# Patient Record
Sex: Female | Born: 1975 | Race: White | Hispanic: No | Marital: Single | Smoking: Never smoker
Health system: Southern US, Community
[De-identification: ages and names within clinical notes are randomized; demographics above are authoritative.]

## PROBLEM LIST (undated history)

## (undated) DIAGNOSIS — R87619 Unspecified abnormal cytological findings in specimens from cervix uteri: Secondary | ICD-10-CM

## (undated) DIAGNOSIS — E669 Obesity, unspecified: Secondary | ICD-10-CM

## (undated) DIAGNOSIS — Z973 Presence of spectacles and contact lenses: Secondary | ICD-10-CM

## (undated) DIAGNOSIS — D649 Anemia, unspecified: Secondary | ICD-10-CM

## (undated) DIAGNOSIS — M1990 Unspecified osteoarthritis, unspecified site: Secondary | ICD-10-CM

## (undated) DIAGNOSIS — F419 Anxiety disorder, unspecified: Secondary | ICD-10-CM

## (undated) DIAGNOSIS — A6920 Lyme disease, unspecified: Secondary | ICD-10-CM

## (undated) DIAGNOSIS — A770 Spotted fever due to Rickettsia rickettsii: Secondary | ICD-10-CM

## (undated) DIAGNOSIS — I4510 Unspecified right bundle-branch block: Secondary | ICD-10-CM

## (undated) DIAGNOSIS — F32A Depression, unspecified: Secondary | ICD-10-CM

## (undated) DIAGNOSIS — T8859XA Other complications of anesthesia, initial encounter: Secondary | ICD-10-CM

## (undated) DIAGNOSIS — F329 Major depressive disorder, single episode, unspecified: Secondary | ICD-10-CM

## (undated) DIAGNOSIS — E785 Hyperlipidemia, unspecified: Secondary | ICD-10-CM

## (undated) DIAGNOSIS — T7840XA Allergy, unspecified, initial encounter: Secondary | ICD-10-CM

## (undated) HISTORY — DX: Depression, unspecified: F32.A

## (undated) HISTORY — DX: Hyperlipidemia, unspecified: E78.5

## (undated) HISTORY — DX: Major depressive disorder, single episode, unspecified: F32.9

## (undated) HISTORY — DX: Lyme disease, unspecified: A69.20

## (undated) HISTORY — DX: Unspecified osteoarthritis, unspecified site: M19.90

## (undated) HISTORY — DX: Unspecified right bundle-branch block: I45.10

## (undated) HISTORY — PX: TYMPANOSTOMY TUBE PLACEMENT: SHX32

## (undated) HISTORY — PX: NO PAST SURGERIES: SHX2092

## (undated) HISTORY — DX: Anxiety disorder, unspecified: F41.9

## (undated) HISTORY — DX: Anemia, unspecified: D64.9

## (undated) HISTORY — DX: Obesity, unspecified: E66.9

## (undated) HISTORY — DX: Unspecified abnormal cytological findings in specimens from cervix uteri: R87.619

## (undated) HISTORY — DX: Allergy, unspecified, initial encounter: T78.40XA

---
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
## 1997-05-23 DIAGNOSIS — R87619 Unspecified abnormal cytological findings in specimens from cervix uteri: Secondary | ICD-10-CM

## 1997-05-23 HISTORY — PX: CRYOTHERAPY: SHX1416

## 1997-05-23 HISTORY — DX: Unspecified abnormal cytological findings in specimens from cervix uteri: R87.619

## 2011-03-10 ENCOUNTER — Other Ambulatory Visit: Payer: Self-pay | Admitting: Family Medicine

## 2011-03-10 ENCOUNTER — Other Ambulatory Visit (HOSPITAL_COMMUNITY)
Admission: RE | Admit: 2011-03-10 | Discharge: 2011-03-10 | Disposition: A | Discharge status: Home / Self Care | Payer: BC Managed Care – PPO | Source: Ambulatory Visit | Attending: Family Medicine | Admitting: Family Medicine

## 2011-03-10 DIAGNOSIS — N6320 Unspecified lump in the left breast, unspecified quadrant: Secondary | ICD-10-CM

## 2011-03-10 DIAGNOSIS — Z01419 Encounter for gynecological examination (general) (routine) without abnormal findings: Secondary | ICD-10-CM | POA: Insufficient documentation

## 2011-03-23 ENCOUNTER — Other Ambulatory Visit: Payer: Self-pay

## 2011-04-05 ENCOUNTER — Ambulatory Visit
Admission: RE | Admit: 2011-04-05 | Discharge: 2011-04-05 | Disposition: A | Discharge status: Home / Self Care | Payer: BC Managed Care – PPO | Source: Ambulatory Visit | Attending: Family Medicine | Admitting: Family Medicine

## 2011-04-05 DIAGNOSIS — N6320 Unspecified lump in the left breast, unspecified quadrant: Secondary | ICD-10-CM

## 2012-02-21 LAB — HM PAP SMEAR

## 2012-04-17 ENCOUNTER — Other Ambulatory Visit (HOSPITAL_COMMUNITY)
Admission: RE | Admit: 2012-04-17 | Discharge: 2012-04-17 | Disposition: A | Discharge status: Home / Self Care | Payer: BC Managed Care – PPO | Source: Ambulatory Visit | Attending: Family Medicine | Admitting: Family Medicine

## 2012-04-17 DIAGNOSIS — Z124 Encounter for screening for malignant neoplasm of cervix: Secondary | ICD-10-CM | POA: Insufficient documentation

## 2012-09-28 ENCOUNTER — Other Ambulatory Visit (INDEPENDENT_AMBULATORY_CARE_PROVIDER_SITE_OTHER): Payer: BC Managed Care – PPO

## 2012-09-28 ENCOUNTER — Encounter: Payer: Self-pay | Admitting: Internal Medicine

## 2012-09-28 ENCOUNTER — Ambulatory Visit (INDEPENDENT_AMBULATORY_CARE_PROVIDER_SITE_OTHER): Payer: BC Managed Care – PPO | Admitting: Internal Medicine

## 2012-09-28 VITALS — BP 110/72 | HR 59 | Temp 98.6°F | Ht 69.0 in | Wt 225.8 lb

## 2012-09-28 DIAGNOSIS — Z0000 Encounter for general adult medical examination without abnormal findings: Secondary | ICD-10-CM

## 2012-09-28 DIAGNOSIS — F32A Depression, unspecified: Secondary | ICD-10-CM | POA: Insufficient documentation

## 2012-09-28 DIAGNOSIS — F3289 Other specified depressive episodes: Secondary | ICD-10-CM

## 2012-09-28 DIAGNOSIS — F329 Major depressive disorder, single episode, unspecified: Secondary | ICD-10-CM

## 2012-09-28 LAB — CBC WITH DIFFERENTIAL/PLATELET
Basophils Relative: 0.4 % (ref 0.0–3.0)
Eosinophils Relative: 2.9 % (ref 0.0–5.0)
HCT: 36.9 % (ref 36.0–46.0)
Hemoglobin: 12.7 g/dL (ref 12.0–15.0)
Lymphs Abs: 3.5 10*3/uL (ref 0.7–4.0)
MCV: 88 fl (ref 78.0–100.0)
Monocytes Absolute: 0.6 10*3/uL (ref 0.1–1.0)
Monocytes Relative: 5.6 % (ref 3.0–12.0)
Neutro Abs: 6.3 10*3/uL (ref 1.4–7.7)
Platelets: 313 10*3/uL (ref 150.0–400.0)
RBC: 4.2 Mil/uL (ref 3.87–5.11)
WBC: 10.7 10*3/uL — ABNORMAL HIGH (ref 4.5–10.5)

## 2012-09-28 LAB — HEPATIC FUNCTION PANEL
Bilirubin, Direct: 0 mg/dL (ref 0.0–0.3)
Total Protein: 7.1 g/dL (ref 6.0–8.3)

## 2012-09-28 LAB — URINALYSIS, ROUTINE W REFLEX MICROSCOPIC
Bilirubin Urine: NEGATIVE
Ketones, ur: NEGATIVE
Nitrite: NEGATIVE
Total Protein, Urine: NEGATIVE
Urine Glucose: NEGATIVE
pH: 7 (ref 5.0–8.0)

## 2012-09-28 LAB — LIPID PANEL
Cholesterol: 171 mg/dL (ref 0–200)
HDL: 37.2 mg/dL — ABNORMAL LOW (ref >39.00)
LDL Cholesterol: 100 mg/dL — ABNORMAL HIGH (ref 0–99)
VLDL: 33.4 mg/dL (ref 0.0–40.0)

## 2012-09-28 LAB — BASIC METABOLIC PANEL
BUN: 11 mg/dL (ref 6–23)
Chloride: 106 mEq/L (ref 96–112)
GFR: 105.83 mL/min (ref >60.00)
Potassium: 4 mEq/L (ref 3.5–5.1)
Sodium: 140 mEq/L (ref 135–145)

## 2012-09-28 MED ORDER — CITALOPRAM HYDROBROMIDE 20 MG PO TABS: 20.0000 mg | ORAL_TABLET | Freq: Every day | ORAL | Status: DC

## 2012-09-28 NOTE — Progress Notes (Signed)
  Subjective:    Patient ID: Joyce Gray, female    DOB: [birth year], 37 y.o.   MRN: [id number]  HPI  Patient to me and our practice, here to establish with new primary care provider previously followed with Dr. Jillyn Hidden  patient is here today for annual physical. Patient feels well and has no complaints.  Past Medical History  Diagnosis Date  . Depression    Family History  Problem Relation Age of Onset  . Hyperlipidemia Mother   . Alcohol abuse Father   . Heart disease Maternal Grandfather   . Mental illness Other   . Breast cancer Other    History  Substance Use Topics  . Smoking status: Never Smoker   . Smokeless tobacco: Not on file  . Alcohol Use: Yes    Review of Systems Constitutional: Negative for fever or weight change.  Respiratory: Negative for cough and shortness of breath.   Cardiovascular: Negative for chest pain or palpitations.  Gastrointestinal: Negative for abdominal pain, no bowel changes.  Musculoskeletal: Negative for gait problem or joint swelling.  Skin: Negative for rash.  Neurological: Negative for dizziness or headache.  No other specific complaints in a complete review of systems (except as listed in HPI above).     Objective:   Physical Exam BP 110/72  Pulse 59  Temp(Src) 98.6 F (37 C) (Oral)  Ht 5\' 9"  (1.753 m)  Wt 225 lb 12.8 oz (102.422 kg)  BMI 33.33 kg/m2  SpO2 98% Wt Readings from Last 3 Encounters:  09/28/12 225 lb 12.8 oz (102.422 kg)   Constitutional: She is overweight, but appears well-developed and well-nourished. No distress.  HENT: Head: Normocephalic and atraumatic. Ears: B TMs ok, no erythema or effusion; Nose: Nose normal. Mouth/Throat: Oropharynx is clear and moist. No oropharyngeal exudate.  Eyes: Conjunctivae and EOM are normal. Pupils are equal, round, and reactive to light. No scleral icterus.  Neck: Normal range of motion. Neck supple. No JVD present. No thyromegaly present.  Cardiovascular: Normal rate,  regular rhythm and normal heart sounds.  No murmur heard. No BLE edema. Pulmonary/Chest: Effort normal and breath sounds normal. No respiratory distress. She has no wheezes.  Abdominal: Soft. Bowel sounds are normal. She exhibits no distension. There is no tenderness. no masses Musculoskeletal: Normal range of motion, no joint effusions. No gross deformities Neurological: She is alert and oriented to person, place, and time. No cranial nerve deficit. Coordination, balance, strength, speech and gait are normal.  Skin: Skin is warm and dry. No rash noted. No erythema.  Psychiatric: She has a normal mood and affect. Her behavior is normal. Judgment and thought content normal.   No results found for this basename: WBC, HGB, HCT, PLT, GLUCOSE, CHOL, TRIG, HDL, LDLDIRECT, LDLCALC, ALT, AST, NA, K, CL, CREATININE, BUN, CO2, TSH, PSA, INR, GLUF, HGBA1C, MICROALBUR       Assessment & Plan:  CPX/v70.0 - Patient has been counseled on age-appropriate routine health concerns for screening and prevention. These are reviewed and up-to-date. Immunizations are up-to-date or declined. Labs ordered and reviewed.

## 2012-09-28 NOTE — Patient Instructions (Signed)
It was good to see you today. We have reviewed your prior records including labs and tests today Health Maintenance reviewed - all recommended immunizations and age-appropriate screenings are up-to-date. Test(s) ordered today. Your results will be released to MyChart (or called to you) after review, usually within 72hours after test completion. If any changes need to be made, you will be notified at that same time. Work on lifestyle changes as discussed (low carb, increased protein diet; improved exercise efforts; weight loss) to control sugar, blood pressure and cholesterol levels and/or reduce risk of developing other medical problems. Look into [email or web address] or other type of food journal to assist you in this process. Please schedule followup in 1 year, call sooner if problems.   Health Maintenance, Females A healthy lifestyle and preventative care can promote health and wellness.  Maintain regular health, dental, and eye exams.  Eat a healthy diet. Foods like vegetables, fruits, whole grains, low-fat dairy products, and lean protein foods contain the nutrients you need without too many calories. Decrease your intake of foods high in solid fats, added sugars, and salt. Get information about a proper diet from your caregiver, if necessary.  Regular physical exercise is one of the most important things you can do for your health. Most adults should get at least 150 minutes of moderate-intensity exercise (any activity that increases your heart rate and causes you to sweat) each week. In addition, most adults need muscle-strengthening exercises on 2 or more days a week.   Maintain a healthy weight. The body mass index (BMI) is a screening tool to identify possible weight problems. It provides an estimate of body fat based on height and weight. Your caregiver can help determine your BMI, and can help you achieve or maintain a healthy weight. For adults 20 years and older:  A BMI below 18.5 is  considered underweight.  A BMI of 18.5 to 24.9 is normal.  A BMI of 25 to 29.9 is considered overweight.  A BMI of 30 and above is considered obese.  Maintain normal blood lipids and cholesterol by exercising and minimizing your intake of saturated fat. Eat a balanced diet with plenty of fruits and vegetables. Blood tests for lipids and cholesterol should begin at age 77 and be repeated every 5 years. If your lipid or cholesterol levels are high, you are over 50, or you are a high risk for heart disease, you may need your cholesterol levels checked more frequently.Ongoing high lipid and cholesterol levels should be treated with medicines if diet and exercise are not effective.  If you smoke, find out from your caregiver how to quit. If you do not use tobacco, do not start.  If you are pregnant, do not drink alcohol. If you are breastfeeding, be very cautious about drinking alcohol. If you are not pregnant and choose to drink alcohol, do not exceed 1 drink per day. One drink is considered to be 12 ounces (355 mL) of beer, 5 ounces (148 mL) of wine, or 1.5 ounces (44 mL) of liquor.  Avoid use of street drugs. Do not share needles with anyone. Ask for help if you need support or instructions about stopping the use of drugs.  High blood pressure causes heart disease and increases the risk of stroke. Blood pressure should be checked at least every 1 to 2 years. Ongoing high blood pressure should be treated with medicines, if weight loss and exercise are not effective.  If you are 50 to 36 years old, ask your  caregiver if you should take aspirin to prevent strokes.  Diabetes screening involves taking a blood sample to check your fasting blood sugar level. This should be done once every 3 years, after age 67, if you are within normal weight and without risk factors for diabetes. Testing should be considered at a younger age or be carried out more frequently if you are overweight and have at least 1  risk factor for diabetes.  Breast cancer screening is essential preventative care for women. You should practice "breast self-awareness." This means understanding the normal appearance and feel of your breasts and may include breast self-examination. Any changes detected, no matter how small, should be reported to a caregiver. Women in their 41s and 30s should have a clinical breast exam (CBE) by a caregiver as part of a regular health exam every 1 to 3 years. After age 44, women should have a CBE every year. Starting at age 6, women should consider having a mammogram (breast X-ray) every year. Women who have a family history of breast cancer should talk to their caregiver about genetic screening. Women at a high risk of breast cancer should talk to their caregiver about having an MRI and a mammogram every year.  The Pap test is a screening test for cervical cancer. Women should have a Pap test starting at age 21. Between ages 21 and 76, Pap tests should be repeated every 2 years. Beginning at age 10, you should have a Pap test every 3 years as long as the past 3 Pap tests have been normal. If you had a hysterectomy for a problem that was not cancer or a condition that could lead to cancer, then you no longer need Pap tests. If you are between ages 51 and 41, and you have had normal Pap tests going back 10 years, you no longer need Pap tests. If you have had past treatment for cervical cancer or a condition that could lead to cancer, you need Pap tests and screening for cancer for at least 20 years after your treatment. If Pap tests have been discontinued, risk factors (such as a new sexual partner) need to be reassessed to determine if screening should be resumed. Some women have medical problems that increase the chance of getting cervical cancer. In these cases, your caregiver may recommend more frequent screening and Pap tests.  The human papillomavirus (HPV) test is an additional test that may be used  for cervical cancer screening. The HPV test looks for the virus that can cause the cell changes on the cervix. The cells collected during the Pap test can be tested for HPV. The HPV test could be used to screen women aged 63 years and older, and should be used in women of any age who have unclear Pap test results. After the age of 36, women should have HPV testing at the same frequency as a Pap test.  Colorectal cancer can be detected and often prevented. Most routine colorectal cancer screening begins at the age of 66 and continues through age 8. However, your caregiver may recommend screening at an earlier age if you have risk factors for colon cancer. On a yearly basis, your caregiver may provide home test kits to check for hidden blood in the stool. Use of a small camera at the end of a tube, to directly examine the colon (sigmoidoscopy or colonoscopy), can detect the earliest forms of colorectal cancer. Talk to your caregiver about this at age 37, when routine screening begins.  Direct examination of the colon should be repeated every 5 to 10 years through age 41, unless early forms of pre-cancerous polyps or small growths are found.  Hepatitis C blood testing is recommended for all people born from [birth year] through 1964 and any individual with known risks for hepatitis C.  Practice safe sex. Use condoms and avoid high-risk sexual practices to reduce the spread of sexually transmitted infections (STIs). Sexually active women aged 27 and younger should be checked for Chlamydia, which is a common sexually transmitted infection. Older women with new or multiple partners should also be tested for Chlamydia. Testing for other STIs is recommended if you are sexually active and at increased risk.  Osteoporosis is a disease in which the bones lose minerals and strength with aging. This can result in serious bone fractures. The risk of osteoporosis can be identified using a bone density scan. Women ages 3 and over  and women at risk for fractures or osteoporosis should discuss screening with their caregivers. Ask your caregiver whether you should be taking a calcium supplement or vitamin D to reduce the rate of osteoporosis.  Menopause can be associated with physical symptoms and risks. Hormone replacement therapy is available to decrease symptoms and risks. You should talk to your caregiver about whether hormone replacement therapy is right for you.  Use sunscreen with a sun protection factor (SPF) of 30 or greater. Apply sunscreen liberally and repeatedly throughout the day. You should seek shade when your shadow is shorter than you. Protect yourself by wearing long sleeves, pants, a wide-brimmed hat, and sunglasses year round, whenever you are outdoors.  Notify your caregiver of new moles or changes in moles, especially if there is a change in shape or color. Also notify your caregiver if a mole is larger than the size of a pencil eraser.  Stay current with your immunizations. Document Released: 11/22/2010 Document Revised: 08/01/2011 Document Reviewed: 11/22/2010 [facility] Patient Information 2013 [place], [place].

## 2013-02-18 ENCOUNTER — Other Ambulatory Visit: Payer: BC Managed Care – PPO

## 2013-02-18 ENCOUNTER — Encounter: Payer: Self-pay | Admitting: Internal Medicine

## 2013-02-18 ENCOUNTER — Ambulatory Visit (INDEPENDENT_AMBULATORY_CARE_PROVIDER_SITE_OTHER): Payer: BC Managed Care – PPO | Admitting: Internal Medicine

## 2013-02-18 VITALS — BP 110/80 | HR 64 | Temp 97.9°F | Wt 222.0 lb

## 2013-02-18 DIAGNOSIS — N390 Urinary tract infection, site not specified: Secondary | ICD-10-CM

## 2013-02-18 DIAGNOSIS — R369 Urethral discharge, unspecified: Secondary | ICD-10-CM

## 2013-02-18 DIAGNOSIS — Z23 Encounter for immunization: Secondary | ICD-10-CM

## 2013-02-18 DIAGNOSIS — R350 Frequency of micturition: Secondary | ICD-10-CM

## 2013-02-18 LAB — POCT URINALYSIS DIPSTICK
Bilirubin, UA: NEGATIVE
Ketones, UA: NEGATIVE
Nitrite, UA: NEGATIVE
Protein, UA: NEGATIVE
pH, UA: 5

## 2013-02-18 MED ORDER — CIPROFLOXACIN HCL 500 MG PO TABS: 500.0000 mg | ORAL_TABLET | Freq: Two times a day (BID) | ORAL | Status: DC

## 2013-02-18 NOTE — Patient Instructions (Signed)
It was good to see you today. Urine pregnancy test today is negative Test(s) ordered today. Your results will be released to MyChart (or called to you) after review, usually within 72hours after test completion. If any changes need to be made, you will be notified at that same time. Begin Cipro twice daily for one week while awaiting test results Your prescription(s) have been submitted to your pharmacy. Please take as directed and contact our office if you believe you are having problem(s) with the medication(s). we'll make referral to Gynecologist for evaluation as discussed. Our office will contact you regarding appointment(s) once made. followup as needed, call if symptoms unimproved in next 2-4 weeks

## 2013-02-18 NOTE — Progress Notes (Signed)
HPI: complains of ?UTI symptoms Onset 3 months ago, progressively worse in past 2 weeks associated with suprapubic pressure, cramping sensation and small volume voiding with increased frequency Reports urethral discharge versus urinary leaking spontaneously through the day over past 2 weeks denies dysuria,hematuria, flank pain or fever The patient has a history of prior UTI -also reports interstitial cystitis as diagnosed unofficially by gynecology years ago. Patient denies new sexual partner, but would like to be checked for STI Reports symptoms worse with stress  PMH: reviewed  ROS:  Gen.: No unexpected weight change, no night sweats Lungs: No cough or shortness of breath Cardiovascular: No palpitations or chest pain  PE: BP 110/80  Pulse 64  Temp(Src) 97.9 F (36.6 C) (Oral)  Wt 222 lb (100.699 kg)  BMI 32.77 kg/m2  SpO2 98% General: obese, anxious, but no acute distress Lungs: Clear to auscultation Cardiovascular: Regular rate rhythm, no edema Abdomen: Mild to moderate discomfort of her suprapubic region, no flank tenderness to palpation GU: deferred  Lab Results  Component Value Date   WBC 10.7* 09/28/2012   HGB 12.7 09/28/2012   HCT 36.9 09/28/2012   PLT 313.0 09/28/2012   GLUCOSE 90 09/28/2012   CHOL 171 09/28/2012   TRIG 167.0* 09/28/2012   HDL 37.20* 09/28/2012   LDLCALC 100* 09/28/2012   ALT 13 09/28/2012   AST 17 09/28/2012   NA 140 09/28/2012   K 4.0 09/28/2012   CL 106 09/28/2012   CREATININE 0.7 09/28/2012   BUN 11 09/28/2012   CO2 25 09/28/2012   TSH 2.06 09/28/2012   POC - large LE, mod blood  Assessment/Plan: Suprapubic pressure and pain x3 months, worse in past 2 weeks ?Urethral discharge versus "leaking"  UTI? IC?- Reports unofficial history of same on prior gynecology evaluation  UPT test today negative  Abnormal UA -begin empiric antibiotic x7 days pending Urine culture for identification and sensitivities; also r/o STI with U gc/chlam testing Gyn refer for urogyn  eval Hydration recommended education provided

## 2013-02-21 LAB — URINE CULTURE: Colony Count: 10000

## 2013-11-18 ENCOUNTER — Other Ambulatory Visit: Payer: Self-pay | Admitting: Internal Medicine

## 2013-11-26 ENCOUNTER — Ambulatory Visit (INDEPENDENT_AMBULATORY_CARE_PROVIDER_SITE_OTHER): Payer: BC Managed Care – PPO | Admitting: Internal Medicine

## 2013-11-26 ENCOUNTER — Other Ambulatory Visit (INDEPENDENT_AMBULATORY_CARE_PROVIDER_SITE_OTHER): Payer: BC Managed Care – PPO

## 2013-11-26 ENCOUNTER — Encounter: Payer: Self-pay | Admitting: Internal Medicine

## 2013-11-26 VITALS — BP 124/78 | HR 52 | Temp 97.6°F | Ht 69.0 in | Wt 216.2 lb

## 2013-11-26 DIAGNOSIS — Z124 Encounter for screening for malignant neoplasm of cervix: Secondary | ICD-10-CM

## 2013-11-26 DIAGNOSIS — Z0000 Encounter for general adult medical examination without abnormal findings: Secondary | ICD-10-CM

## 2013-11-26 DIAGNOSIS — M722 Plantar fascial fibromatosis: Secondary | ICD-10-CM

## 2013-11-26 LAB — CBC WITH DIFFERENTIAL/PLATELET
BASOS PCT: 0.4 % (ref 0.0–3.0)
Basophils Absolute: 0 10*3/uL (ref 0.0–0.1)
EOS ABS: 0.3 10*3/uL (ref 0.0–0.7)
Eosinophils Relative: 3.6 % (ref 0.0–5.0)
HCT: 40.1 % (ref 36.0–46.0)
Hemoglobin: 13.7 g/dL (ref 12.0–15.0)
LYMPHS PCT: 32.1 % (ref 12.0–46.0)
Lymphs Abs: 3 10*3/uL (ref 0.7–4.0)
MCHC: 34.1 g/dL (ref 30.0–36.0)
MCV: 88.5 fl (ref 78.0–100.0)
MONOS PCT: 5.7 % (ref 3.0–12.0)
Monocytes Absolute: 0.5 10*3/uL (ref 0.1–1.0)
Neutro Abs: 5.5 10*3/uL (ref 1.4–7.7)
Neutrophils Relative %: 58.2 % (ref 43.0–77.0)
Platelets: 340 10*3/uL (ref 150.0–400.0)
RBC: 4.53 Mil/uL (ref 3.87–5.11)
RDW: 13.6 % (ref 11.5–15.5)
WBC: 9.4 10*3/uL (ref 4.0–10.5)

## 2013-11-26 LAB — LIPID PANEL
Cholesterol: 204 mg/dL — ABNORMAL HIGH (ref 0–200)
HDL: 40.9 mg/dL (ref >39.00)
LDL Cholesterol: 131 mg/dL — ABNORMAL HIGH (ref 0–99)
NONHDL: 163.1
Total CHOL/HDL Ratio: 5
Triglycerides: 162 mg/dL — ABNORMAL HIGH (ref 0.0–149.0)
VLDL: 32.4 mg/dL (ref 0.0–40.0)

## 2013-11-26 LAB — BASIC METABOLIC PANEL
BUN: 12 mg/dL (ref 6–23)
CHLORIDE: 102 meq/L (ref 96–112)
CO2: 28 meq/L (ref 19–32)
CREATININE: 0.6 mg/dL (ref 0.4–1.2)
Calcium: 9.5 mg/dL (ref 8.4–10.5)
GFR: 115 mL/min (ref >60.00)
Glucose, Bld: 94 mg/dL (ref 70–99)
POTASSIUM: 4.7 meq/L (ref 3.5–5.1)
Sodium: 137 mEq/L (ref 135–145)

## 2013-11-26 LAB — URINALYSIS, ROUTINE W REFLEX MICROSCOPIC
Bilirubin Urine: NEGATIVE
Hgb urine dipstick: NEGATIVE
KETONES UR: NEGATIVE
Leukocytes, UA: NEGATIVE
Nitrite: NEGATIVE
RBC / HPF: NONE SEEN (ref 0–?)
TOTAL PROTEIN, URINE-UPE24: NEGATIVE
UROBILINOGEN UA: 0.2 (ref 0.0–1.0)
Urine Glucose: NEGATIVE
WBC, UA: NONE SEEN (ref 0–?)
pH: 6 (ref 5.0–8.0)

## 2013-11-26 LAB — HEPATIC FUNCTION PANEL
ALT: 13 U/L (ref 0–35)
AST: 21 U/L (ref 0–37)
Albumin: 4.1 g/dL (ref 3.5–5.2)
Alkaline Phosphatase: 67 U/L (ref 39–117)
BILIRUBIN TOTAL: 0.6 mg/dL (ref 0.2–1.2)
Bilirubin, Direct: 0.1 mg/dL (ref 0.0–0.3)
TOTAL PROTEIN: 7.8 g/dL (ref 6.0–8.3)

## 2013-11-26 LAB — TSH: TSH: 1.69 u[IU]/mL (ref 0.35–4.50)

## 2013-11-26 MED ORDER — CITALOPRAM HYDROBROMIDE 20 MG PO TABS: ORAL_TABLET | ORAL | Status: DC

## 2013-11-26 NOTE — Progress Notes (Signed)
Subjective:    Patient ID: Joyce Gray, female    DOB: 1976/03/10, 38 y.o.   MRN: [id number]  HPI  patient is here today for annual physical. Patient feels well and has no complaints.  Also reviewed chronic medical issues and interval medical events  Past Medical History  Diagnosis Date  . Depression    Family History  Problem Relation Age of Onset  . Hyperlipidemia Mother   . Alcohol abuse Father   . Heart disease Maternal Grandfather   . Mental illness Other   . Breast cancer Other    History  Substance Use Topics  . Smoking status: Never Smoker   . Smokeless tobacco: Not on file  . Alcohol Use: Yes   Review of Systems  Constitutional: Negative for fatigue and unexpected weight change.  Respiratory: Negative for cough, shortness of breath and wheezing.   Cardiovascular: Negative for chest pain, palpitations and leg swelling.  Gastrointestinal: Negative for nausea, abdominal pain and diarrhea.  Musculoskeletal:       L heel pain, worse in AM getting out of bed - ongoing x 1 mo  Neurological: Negative for dizziness, weakness, light-headedness and headaches.  Psychiatric/Behavioral: Negative for dysphoric mood. The patient is not nervous/anxious.   All other systems reviewed and are negative.      Objective:   Physical Exam  BP 124/78  Pulse 52  Temp(Src) 97.6 F (36.4 C) (Oral)  Ht 5\' 9"  (1.753 m)  Wt 216 lb 4 oz (98.09 kg)  BMI 31.92 kg/m2  SpO2 98% Wt Readings from Last 3 Encounters:  11/26/13 216 lb 4 oz (98.09 kg)  02/18/13 222 lb (100.699 kg)  09/28/12 225 lb 12.8 oz (102.422 kg)   Constitutional: She is obese, but appears well-developed and well-nourished. No distress.  HENT: Head: Normocephalic and atraumatic. Ears: B TMs ok, no erythema or effusion; Nose: Nose normal. Mouth/Throat: Oropharynx is clear and moist. No oropharyngeal exudate.  Eyes: Conjunctivae and EOM are normal. Pupils are equal, round, and reactive to light. No scleral icterus.    Neck: Normal range of motion. Neck supple. No JVD present. No thyromegaly present.  Cardiovascular: Normal rate, regular rhythm and normal heart sounds.  No murmur heard. No BLE edema. Pulmonary/Chest: Effort normal and breath sounds normal. No respiratory distress. She has no wheezes.  Abdominal: Soft. Bowel sounds are normal. She exhibits no distension. There is no tenderness. no masses GU/breast - defer to gyn Musculoskeletal: L heel tenderness consistent with PF, Normal range of motion, no joint effusions. No gross deformities Neurological: She is alert and oriented to person, place, and time. No cranial nerve deficit. Coordination, balance, strength, speech and gait are normal.  Skin: Skin is warm and dry. No rash noted. No erythema.  Psychiatric: She has a normal mood and affect. Her behavior is normal. Judgment and thought content normal.    Lab Results  Component Value Date   WBC 10.7* 09/28/2012   HGB 12.7 09/28/2012   HCT 36.9 09/28/2012   PLT 313.0 09/28/2012   GLUCOSE 90 09/28/2012   CHOL 171 09/28/2012   TRIG 167.0* 09/28/2012   HDL 37.20* 09/28/2012   LDLCALC 100* 09/28/2012   ALT 13 09/28/2012   AST 17 09/28/2012   NA 140 09/28/2012   K 4.0 09/28/2012   CL 106 09/28/2012   CREATININE 0.7 09/28/2012   BUN 11 09/28/2012   CO2 25 09/28/2012   TSH 2.06 09/28/2012    No results found.     Assessment &  Plan:   CPX/v70.0 - Patient has been counseled on age-appropriate routine health concerns for screening and prevention. These are reviewed and up-to-date. Immunizations are up-to-date or declined. Labs ordered and reviewed.  L plantar fascitis - education and reassurance provided - symptomatic care advised

## 2013-11-26 NOTE — Progress Notes (Signed)
Pre visit review using our clinic review tool, if applicable. No additional management support is needed unless otherwise documented below in the visit note. 

## 2013-11-26 NOTE — Patient Instructions (Addendum)
It was good to see you today.  We have reviewed your prior records including labs and tests today  Health Maintenance reviewed - all recommended immunizations and age-appropriate screenings are up-to-date.  Test(s) ordered today. Your results will be released to [place] (or called to you) after review, usually within 72hours after test completion. If any changes need to be made, you will be notified at that same time.  Medications reviewed and updated, no changes recommended at this time. Refill on medication(s) as discussed today.  we'll make referral to gynecologist for your PAP smear. Our office will contact you regarding appointment(s) once made.  Please schedule followup in 12 months for annual exam and labs, call sooner if problems.  Health Maintenance, Female A healthy lifestyle and preventative care can promote health and wellness.  Maintain regular health, dental, and eye exams.  Eat a healthy diet. Foods like vegetables, fruits, whole grains, low-fat dairy products, and lean protein foods contain the nutrients you need without too many calories. Decrease your intake of foods high in solid fats, added sugars, and salt. Get information about a proper diet from your caregiver, if necessary.  Regular physical exercise is one of the most important things you can do for your health. Most adults should get at least 150 minutes of moderate-intensity exercise (any activity that increases your heart rate and causes you to sweat) each week. In addition, most adults need muscle-strengthening exercises on 2 or more days a week.   Maintain a healthy weight. The body mass index (BMI) is a screening tool to identify possible weight problems. It provides an estimate of body fat based on height and weight. Your caregiver can help determine your BMI, and can help you achieve or maintain a healthy weight. For adults 20 years and older:  A BMI below 18.5 is considered underweight.  A BMI of 18.5 to  24.9 is normal.  A BMI of 25 to 29.9 is considered overweight.  A BMI of 30 and above is considered obese.  Maintain normal blood lipids and cholesterol by exercising and minimizing your intake of saturated fat. Eat a balanced diet with plenty of fruits and vegetables. Blood tests for lipids and cholesterol should begin at age 4 and be repeated every 5 years. If your lipid or cholesterol levels are high, you are over 50, or you are a high risk for heart disease, you may need your cholesterol levels checked more frequently.Ongoing high lipid and cholesterol levels should be treated with medicines if diet and exercise are not effective.  If you smoke, find out from your caregiver how to quit. If you do not use tobacco, do not start.  Lung cancer screening is recommended for adults aged 40-80 years who are at high risk for developing lung cancer because of a history of smoking. Yearly low-dose computed tomography (CT) is recommended for people who have at least a 30-pack-year history of smoking and are a current smoker or have quit within the past 15 years. A pack year of smoking is smoking an average of 1 pack of cigarettes a day for 1 year (for example: 1 pack a day for 30 years or 2 packs a day for 15 years). Yearly screening should continue until the smoker has stopped smoking for at least 15 years. Yearly screening should also be stopped for people who develop a health problem that would prevent them from having lung cancer treatment.  If you are pregnant, do not drink alcohol. If you are breastfeeding, be  very cautious about drinking alcohol. If you are not pregnant and choose to drink alcohol, do not exceed 1 drink per day. One drink is considered to be 12 ounces (355 mL) of beer, 5 ounces (148 mL) of wine, or 1.5 ounces (44 mL) of liquor.  Avoid use of street drugs. Do not share needles with anyone. Ask for help if you need support or instructions about stopping the use of drugs.  High blood  pressure causes heart disease and increases the risk of stroke. Blood pressure should be checked at least every 1 to 2 years. Ongoing high blood pressure should be treated with medicines, if weight loss and exercise are not effective.  If you are 55 to 37 years old, ask your caregiver if you should take aspirin to prevent strokes.  Diabetes screening involves taking a blood sample to check your fasting blood sugar level. This should be done once every 3 years, after age 92, if you are within normal weight and without risk factors for diabetes. Testing should be considered at a younger age or be carried out more frequently if you are overweight and have at least 1 risk factor for diabetes.  Breast cancer screening is essential preventative care for women. You should practice "breast self-awareness." This means understanding the normal appearance and feel of your breasts and may include breast self-examination. Any changes detected, no matter how small, should be reported to a caregiver. Women in their 82s and 30s should have a clinical breast exam (CBE) by a caregiver as part of a regular health exam every 1 to 3 years. After age 62, women should have a CBE every year. Starting at age 73, women should consider having a mammogram (breast X-ray) every year. Women who have a family history of breast cancer should talk to their caregiver about genetic screening. Women at a high risk of breast cancer should talk to their caregiver about having an MRI and a mammogram every year.  Breast cancer gene (BRCA)-related cancer risk assessment is recommended for women who have family members with BRCA-related cancers. BRCA-related cancers include breast, ovarian, tubal, and peritoneal cancers. Having family members with these cancers may be associated with an increased risk for harmful changes (mutations) in the breast cancer genes BRCA1 and BRCA2. Results of the assessment will determine the need for genetic counseling  and BRCA1 and BRCA2 testing.  The Pap test is a screening test for cervical cancer. Women should have a Pap test starting at age 84. Between ages 69 and 50, Pap tests should be repeated every 2 years. Beginning at age 22, you should have a Pap test every 3 years as long as the past 3 Pap tests have been normal. If you had a hysterectomy for a problem that was not cancer or a condition that could lead to cancer, then you no longer need Pap tests. If you are between ages 5 and 36, and you have had normal Pap tests going back 10 years, you no longer need Pap tests. If you have had past treatment for cervical cancer or a condition that could lead to cancer, you need Pap tests and screening for cancer for at least 20 years after your treatment. If Pap tests have been discontinued, risk factors (such as a new sexual partner) need to be reassessed to determine if screening should be resumed. Some women have medical problems that increase the chance of getting cervical cancer. In these cases, your caregiver may recommend more frequent screening and Pap  tests.  The human papillomavirus (HPV) test is an additional test that may be used for cervical cancer screening. The HPV test looks for the virus that can cause the cell changes on the cervix. The cells collected during the Pap test can be tested for HPV. The HPV test could be used to screen women aged 26 years and older, and should be used in women of any age who have unclear Pap test results. After the age of 27, women should have HPV testing at the same frequency as a Pap test.  Colorectal cancer can be detected and often prevented. Most routine colorectal cancer screening begins at the age of 7 and continues through age 60. However, your caregiver may recommend screening at an earlier age if you have risk factors for colon cancer. On a yearly basis, your caregiver may provide home test kits to check for hidden blood in the stool. Use of a small camera at the end  of a tube, to directly examine the colon (sigmoidoscopy or colonoscopy), can detect the earliest forms of colorectal cancer. Talk to your caregiver about this at age 54, when routine screening begins. Direct examination of the colon should be repeated every 5 to 10 years through age 63, unless early forms of pre-cancerous polyps or small growths are found.  Hepatitis C blood testing is recommended for all people born from [birth year] through 1964 and any individual with known risks for hepatitis C.  Practice safe sex. Use condoms and avoid high-risk sexual practices to reduce the spread of sexually transmitted infections (STIs). Sexually active women aged 36 and younger should be checked for Chlamydia, which is a common sexually transmitted infection. Older women with new or multiple partners should also be tested for Chlamydia. Testing for other STIs is recommended if you are sexually active and at increased risk.  Osteoporosis is a disease in which the bones lose minerals and strength with aging. This can result in serious bone fractures. The risk of osteoporosis can be identified using a bone density scan. Women ages 70 and over and women at risk for fractures or osteoporosis should discuss screening with their caregivers. Ask your caregiver whether you should be taking a calcium supplement or vitamin D to reduce the rate of osteoporosis.  Menopause can be associated with physical symptoms and risks. Hormone replacement therapy is available to decrease symptoms and risks. You should talk to your caregiver about whether hormone replacement therapy is right for you.  Use sunscreen. Apply sunscreen liberally and repeatedly throughout the day. You should seek shade when your shadow is shorter than you. Protect yourself by wearing long sleeves, pants, a wide-brimmed hat, and sunglasses year round, whenever you are outdoors.  Notify your caregiver of new moles or changes in moles, especially if there is a change  in shape or color. Also notify your caregiver if a mole is larger than the size of a pencil eraser.  Stay current with your immunizations. Document Released: 11/22/2010 Document Revised: 09/03/2012 Document Reviewed: 04/10/2013 [facility] Patient Information 2015 [place], [place]. This information is not intended to replace advice given to you by your health care provider. Make sure you discuss any questions you have with your health care provider. Plantar Fasciitis Plantar fasciitis is a common condition that causes foot pain. It is soreness (inflammation) of the band of tough fibrous tissue on the bottom of the foot that runs from the heel bone (calcaneus) to the ball of the foot. The cause of this soreness may be  from excessive standing, poor fitting shoes, running on hard surfaces, being overweight, having an abnormal walk, or overuse (this is common in runners) of the painful foot or feet. It is also common in aerobic exercise dancers and ballet dancers. SYMPTOMS  Most people with plantar fasciitis complain of:  Severe pain in the morning on the bottom of their foot especially when taking the first steps out of bed. This pain recedes after a few minutes of walking.  Severe pain is experienced also during walking following a long period of inactivity.  Pain is worse when walking barefoot or up stairs DIAGNOSIS   Your caregiver will diagnose this condition by examining and feeling your foot.  Special tests such as X-rays of your foot, are usually not needed. PREVENTION   Consult a sports medicine professional before beginning a new exercise program.  Walking programs offer a good workout. With walking there is a lower chance of overuse injuries common to runners. There is less impact and less jarring of the joints.  Begin all new exercise programs slowly. If problems or pain develop, decrease the amount of time or distance until you are at a comfortable level.  Wear good shoes and replace  them regularly.  Stretch your foot and the heel cords at the back of the ankle (Achilles tendon) both before and after exercise.  Run or exercise on even surfaces that are not hard. For example, asphalt is better than pavement.  Do not run barefoot on hard surfaces.  If using a treadmill, vary the incline.  Do not continue to workout if you have foot or joint problems. Seek professional help if they do not improve. HOME CARE INSTRUCTIONS   Avoid activities that cause you pain until you recover.  Use ice or cold packs on the problem or painful areas after working out.  Only take over-the-counter or prescription medicines for pain, discomfort, or fever as directed by your caregiver.  Soft shoe inserts or athletic shoes with air or gel sole cushions may be helpful.  If problems continue or become more severe, consult a sports medicine caregiver or your own health care provider. Cortisone is a potent anti-inflammatory medication that may be injected into the painful area. You can discuss this treatment with your caregiver. MAKE SURE YOU:   Understand these instructions.  Will watch your condition.  Will get help right away if you are not doing well or get worse. Document Released: 02/01/2001 Document Revised: 08/01/2011 Document Reviewed: 04/02/2008 [facility] Patient Information 2015 [place], [place]. This information is not intended to replace advice given to you by your health care provider. Make sure you discuss any questions you have with your health care provider.

## 2013-12-18 ENCOUNTER — Encounter: Payer: Self-pay | Admitting: Internal Medicine

## 2013-12-18 ENCOUNTER — Ambulatory Visit (INDEPENDENT_AMBULATORY_CARE_PROVIDER_SITE_OTHER)
Admission: RE | Admit: 2013-12-18 | Discharge: 2013-12-18 | Disposition: A | Discharge status: Home / Self Care | Payer: BC Managed Care – PPO | Source: Ambulatory Visit | Attending: Internal Medicine | Admitting: Internal Medicine

## 2013-12-18 ENCOUNTER — Ambulatory Visit (INDEPENDENT_AMBULATORY_CARE_PROVIDER_SITE_OTHER): Payer: BC Managed Care – PPO | Admitting: Internal Medicine

## 2013-12-18 VITALS — BP 104/78 | HR 60 | Temp 98.3°F | Wt 218.2 lb

## 2013-12-18 DIAGNOSIS — R0609 Other forms of dyspnea: Secondary | ICD-10-CM

## 2013-12-18 DIAGNOSIS — R0600 Dyspnea, unspecified: Secondary | ICD-10-CM

## 2013-12-18 DIAGNOSIS — R0989 Other specified symptoms and signs involving the circulatory and respiratory systems: Secondary | ICD-10-CM

## 2013-12-18 DIAGNOSIS — E669 Obesity, unspecified: Secondary | ICD-10-CM

## 2013-12-18 NOTE — Patient Instructions (Addendum)
The Sleep Medicine referral will be scheduled and you'll be notified of the time. The most common cause of elevated triglycerides (TG) is the ingestion of sugar from high fructose corn syrup sources added to processed foods & drinks.  Eat a low-fat diet with lots of fruits and vegetables, up to 7-9 servings per day. Consume less than  30  Grams (preferably ZERO) of sugar per day from foods & drinks with High Fructose Corn Syrup (HFCS) sugar as #1,2,3 or # 4 on label.[employer], [employer] [place] do not carry products with HFCS. Follow a  low carb nutrition program such as [place] or [employer] . White carbohydrates (potatoes, rice, bread, and pasta) have a high spike of sugar and a high load of sugar. For example a  baked potato has a cup of sugar and a  french fry  2 teaspoons of sugar. Yams, wild  rice, whole grained bread &  wheat pasta have been much lower spike and load of  sugar. Portions should be the size of a deck of cards or your palm.

## 2013-12-18 NOTE — Progress Notes (Signed)
   Subjective:    Patient ID: Joyce Gray, female    DOB: 03/11/1976, 38 y.o.   MRN: [id number]  HPI  Between the Fall of 2013 & up to  2 weeks ago,she's had 3-4 episodes of awakening with sensation that she could not breathe.  She lives with her father and step mother. No one has observed her sleep pattern .She is unsure as to snoring or apnea.   On one occasion it did occur after drinking 2 glasses wine before going  to bed  The most recent episode was in the context of a respiratory tract infection 2 weeks ago with increased secretions.  She is also is exposed to 6 cats. She describes only minor sneezing. She does note need for repeated "throat clearing".  All the symptoms seem to be in the context of having gained over 30 pounds during the period tendon 2012-2013 while taking citalopram.The weight gain was in spite of a regular CVE program.  She has noted some change in her hair; it is more fine.  Palpitations have been recurrent issue.  TSH was WNL this month. Those labs revealed elevated TGs.    Review of Systems No significant change in appetite. No blurred, double vision ,loss of vision No constipation; diarrhea;hoarseness;dysphagia No change in nails,skin No numbness or tingling; tremor No anxiety; depression; panic attacks No temperature intolerance to heat ,cold      Objective:   Physical Exam Gen.:  well-nourished; in no acute distress. As per CDC criteria obesity is present. Her weight is mainly in hip girdle distribution rather than centrally. Eyes: Extraocular motion intact; no lid lag or proptosis ,nystagmus Throat: normal diameter of oropharynx w/o crowding Neck: full ROM; no masses ; thyroid normal  Heart: Normal rhythm and rate without significant murmur, gallop, or extra heart sounds Lungs: Chest clear to auscultation without rales,rales, wheezes Neuro:Deep tendon reflexes are equal and within normal limits; no tremor  Skin: Warm and dry without  significant lesions or rashes; no onycholysis Lymphatic: no cervical or axillary LA Psych: Normally communicative and interactive; no abnormal mood or affect clinically.             Assessment & Plan:  #1 nocturnal orthopnea ; R/O sleep apnea #2 hypertriglyceridemia See orders & AVS

## 2013-12-18 NOTE — Progress Notes (Signed)
Pre visit review using our clinic review tool, if applicable. No additional management support is needed unless otherwise documented below in the visit note. 

## 2013-12-25 ENCOUNTER — Encounter: Payer: Self-pay | Admitting: Certified Nurse Midwife

## 2013-12-25 ENCOUNTER — Ambulatory Visit (INDEPENDENT_AMBULATORY_CARE_PROVIDER_SITE_OTHER): Payer: BC Managed Care – PPO | Admitting: Certified Nurse Midwife

## 2013-12-25 VITALS — BP 116/74 | HR 64 | Ht 69.0 in | Wt 214.0 lb

## 2013-12-25 DIAGNOSIS — Z124 Encounter for screening for malignant neoplasm of cervix: Secondary | ICD-10-CM

## 2013-12-25 DIAGNOSIS — Z01419 Encounter for gynecological examination (general) (routine) without abnormal findings: Secondary | ICD-10-CM

## 2013-12-25 NOTE — Patient Instructions (Signed)

## 2013-12-25 NOTE — Progress Notes (Signed)
Patient ID: Joyce Gray, female   DOB: 06/11/1975, 38 y.o.   MRN: [id number] 38 y.o. [id number] Single Caucasian Fe here to establish gyn care and  for annual exam. Not currently sexually active. STD screening after last partner.Last gyn exam 2012. Not currently sexually active, uses condoms for contraception. Sees PCP for aex/labs/ medciation management for depression. No health issues today.  Patient's last menstrual period was 12/16/2013.          Sexually active: No.  The current method of family planning is condoms most of the time and abstinence.    Exercising: Yes.    Gym/ health club routine includes cardio and and weights 4 times per week. Smoker:  no  Health Maintenance: Pap:  Fall 2012, [facility] previous history of abnormal Pap smear, with cryo 11/97, reoccurrence 2004 with colposcopy with repeat pap only [facility]) MMG:  Fall 2012 normal TDaP:  2010 Labs: PCP in [facility] 11/26/13   reports that she has never smoked. She has never used smokeless tobacco. She reports that she drinks alcohol. She reports that she does not use illicit drugs.  Past Medical History  Diagnosis Date  . Depression     Past Surgical History  Procedure Laterality Date  . No past surgeries      Current Outpatient Prescriptions  Medication Sig Dispense Refill  . citalopram (CELEXA) 20 MG tablet TAKE 1 TABLET BY MOUTH EVERY DAY  90 tablet  3   No current facility-administered medications for this visit.    Family History  Problem Relation Age of Onset  . Hyperlipidemia Mother   . Alcohol abuse Father   . Heart disease Maternal Grandfather   . Mental illness Other   . Breast cancer Other     ROS:  Pertinent items are noted in HPI.  Otherwise, a comprehensive ROS was negative.  Exam:   BP 116/74  Pulse 64  Ht 5\' 9"  (1.753 m)  Wt 214 lb (97.07 kg)  BMI 31.59 kg/m2  LMP 12/16/2013 Height: 5\' 9"  (175.3 cm)  Ht Readings from Last 3 Encounters:  12/25/13 5\' 9"  (1.753 m)  11/26/13 5\' 9"  (1.753 m)   09/28/12 5\' 9"  (1.753 m)    General appearance: alert, cooperative and appears stated age Head: Normocephalic, without obvious abnormality, atraumatic Neck: no adenopathy, supple, symmetrical, trachea midline and thyroid normal to inspection and palpation and non-palpable Lungs: clear to auscultation bilaterally Breasts: normal appearance, no masses or tenderness, No nipple retraction or dimpling, No nipple discharge or bleeding, No axillary or supraclavicular adenopathy Heart: regular rate and rhythm Abdomen: soft, non-tender; no masses,  no organomegaly Extremities: extremities normal, atraumatic, no cyanosis or edema Skin: Skin color, texture, turgor normal. No rashes or lesions Lymph nodes: Cervical, supraclavicular, and axillary nodes normal. No abnormal inguinal nodes palpated Neurologic: Grossly normal   Pelvic: External genitalia:  no lesions              Urethra:  normal appearing urethra with no masses, tenderness or lesions              Bartholin's and Skene's: normal                 Vagina: normal appearing vagina with normal color and discharge, no lesions              Cervix: normal,non tender              Pap taken: Yes.   Bimanual Exam:  Uterus:  normal size, contour,  position, consistency, mobility, non-tender and anteverted              Adnexa: normal adnexa and no mass, fullness, tenderness               Rectovaginal: Confirms               Anus:  normal sphincter tone, no lesions  A:  Well Woman with normal exam  Contraception condoms  History of abnormal pap with cryo, last abnormal 2004 with repeat pap only  Depression on stable medication with PCP management  P:   Reviewed health and wellness pertinent to exam  Stressed importance of pap follow up  Pap smear taken today with HPVHR  Continue follow up as indicated   counseled on breast self exam, STD prevention, HIV risk factors and prevention, adequate intake of calcium and vitamin D, diet and  exercise  return annually or prn  An After Visit Summary was printed and given to the patient.

## 2013-12-26 NOTE — Progress Notes (Signed)
Reviewed personally.  M. Suzanne Cesar Rogerson, MD.  

## 2013-12-27 LAB — IPS PAP TEST WITH HPV

## 2014-03-05 ENCOUNTER — Encounter: Payer: Self-pay | Admitting: Family

## 2014-03-05 ENCOUNTER — Ambulatory Visit (INDEPENDENT_AMBULATORY_CARE_PROVIDER_SITE_OTHER): Payer: BC Managed Care – PPO | Admitting: Family

## 2014-03-05 VITALS — BP 130/88 | HR 63 | Temp 98.2°F | Resp 18 | Wt 216.0 lb

## 2014-03-05 DIAGNOSIS — Z309 Encounter for contraceptive management, unspecified: Secondary | ICD-10-CM | POA: Insufficient documentation

## 2014-03-05 DIAGNOSIS — Z30011 Encounter for initial prescription of contraceptive pills: Secondary | ICD-10-CM

## 2014-03-05 MED ORDER — NORGESTIMATE-ETH ESTRADIOL 0.25-35 MG-MCG PO TABS: 1.0000 | ORAL_TABLET | Freq: Every day | ORAL | Status: DC

## 2014-03-05 NOTE — Assessment & Plan Note (Signed)
Discussed multiple birth control options with patient. Decided on oral contraception. Start Sprintec. Information on starting and missing pill given. Informed patient that her cycle may be altered for up to 3 months and to use alternative protection depending on start method selected. Pt in agreement with the plan and will follow up as needed.

## 2014-03-05 NOTE — Patient Instructions (Signed)
Thank you for choosing [facility].  Summary/Instructions:   Your prescription sent to your pharmacy  Instructions are below.     Starting Your Oral Contraception:  1) First Day Start - Take your first pill during the first 24 hours of your menstrual cycle. No back-up contraceptivemethod is needed when the pill is started the first day of your menses.  2) Sunday Start - Wait until the first Sunday after your menstrual cycle begins to take your first pill. With this optionuse another method of birth control for the first 7 days of the first cycle only.  3) "Quick Start" - Start the pill today. If you have had unprotected sexual intercourse since your last period, perform a pregnancy test prior to starting the pill. If it is negative, start the pill today. Use another method of birth control such as condoms or spermicide the first seven days of the first cycle of use.  Oral Contraception Answers to Common Questions.   1)  Take your birth control pill at the same time every day. This ensures that a constant hormone level is maintained at all times.  2) Should you experience nausea or vomiting, try taking your pill after a meal or at bedtime.  3)  Irregular vaginal bleeding or spotting may occur while you are taking the pills, especially during the first few months of oral contraceptive use. If you experience spotting or break-through bleeding, (bleeding that occurs outside of the placebo week) that occurs after the first 3 cycles, or lasts for more than a few days, see your health care provider.  4) Birth control pills do not protect you from sexually transmitted infections.  INSTRUCTIONS FOR MISSED PILLS: 1 active pill < 24 hours late in any week: Take 1 active pill ASAP* and continue pack as usual.  Missed 1 or more active pills (i.e., >24 hours late): If during week 1: Take 1 active pill ASAP* and continue pack as usual. Back-up contraception for 7 days. Consider Emergency  Contraception (EC) if unprotected intercourse occurred within the  5 days prior to missing pill.  If during week 2 or 3 and missed < 3 pills: Take 1 active pill ASAP* and continue active pills as usual, but discard placebo pills and start a new pack  If during week 2 or 3 and ? 3 pills missed: Take 1 active pill ASAP* and continue active pills as usual, but discard placebo pills and start a new pack Back-up contraception for 7 days. Consider EC if repeated or prolonged omission, or if unprotected intercourse occurred during the time the pills were missed and up until seven active pills have been taken Instructions for missed extended or continuous hormonal contraceptives:  Missed pill after 21 consecutive days of extended or continuous use, up to 7 days can be missed.  If > 7 days missed, instructions would be the same as for cyclic users who have missed/delayed combined hormonal contraceptive in the first week of use.  When the extended/continuous regimen is resumed, recommendations for cyclic users for missed/delayed combined hormonal contraceptive during the first 21 consecutive days of use should be followed.  **If you still are not sure what to do about the pills you have missed, use a back-up method anytime you have sex. Keep taking one pill each day until you can reach your doctor or clinic.  MEDICATIONS AND BIRTH CONTROL PILLS: The following medications and supplements may interfere with the effectiveness of CHC:  some antibiotics  anticonvulsants  St. John's  Wort  Provigil  It is recommend that if you take the above medications and supplements and are sexually active with a female partner, you use a back-up method of birth control while using the medication and for 7 consecutive days once the medication is completed.  IF YOU EXPERIENCE ANY OF THE FOLLOWING, PLEASE CALL IMMEDIATELY OR GO TO THE EMERGENCY ROOM:   severe abdominal pain or tenderness in the lower  abdomen  chest pain, sharp, sudden shortness of breath or coughing up blood  headache, severe and sudden, or vomiting, dizziness or faintness  eyesight problems, such as sudden blurred or doubled vision or flashes of light  severe pain or swelling in calf or groin

## 2014-03-05 NOTE — Progress Notes (Signed)
   Subjective:    Patient ID: Joyce Gray, female    DOB: 07/25/1975, 38 y.o.   MRN: [id number]  HPI:  Joyce Gray is a 38 y.o. female who presents today to start birth control. Currently she is not taking anything and wants to gain more control of her cycle. She was previously on Ortho-Tricyclin. She started her menstrual cycle today. She indicates she is not a smoker. Currently her cycle is about every 26 days and fairly regular.   No Known Allergies  Current Outpatient Prescriptions on File Prior to Visit  Medication Sig Dispense Refill  . citalopram (CELEXA) 20 MG tablet TAKE 1 TABLET BY MOUTH EVERY DAY  90 tablet  3  . Multiple Vitamin (MULTIVITAMIN) tablet Take 1 tablet by mouth daily.       No current facility-administered medications on file prior to visit.   Past Medical History  Diagnosis Date  . Depression    Review of Systems     See HPI Objective:    BP 130/88  Pulse 63  Temp(Src) 98.2 F (36.8 C) (Oral)  Resp 18  Wt 216 lb (97.977 kg)  SpO2 98%  LMP 03/05/2014 Nursing note and vital signs reviewed.  Physical Exam  Constitutional: She is oriented to person, place, and time. She appears well-developed and well-nourished.  Cardiovascular: Normal rate, regular rhythm and normal heart sounds.   Pulmonary/Chest: Effort normal and breath sounds normal.  Neurological: She is alert and oriented to person, place, and time.  Skin: Skin is warm and dry.  Psychiatric: She has a normal mood and affect. Her behavior is normal. Judgment and thought content normal.        Assessment & Plan:

## 2014-03-05 NOTE — Progress Notes (Signed)
Pre visit review using our clinic review tool, if applicable. No additional management support is needed unless otherwise documented below in the visit note. 

## 2014-05-23 HISTORY — PX: COLPOSCOPY: SHX161

## 2014-05-26 ENCOUNTER — Telehealth: Payer: Self-pay | Admitting: Certified Nurse Midwife

## 2014-05-26 NOTE — Telephone Encounter (Signed)
Patient wants to speak with nurse about when to schedule an appointment for pregnancy testing.

## 2014-05-26 NOTE — Telephone Encounter (Signed)
Message left to return call to Kaien Pezzullo at [phone].    

## 2014-05-26 NOTE — Telephone Encounter (Signed)
Spoke with patient. Patient has had one week of feeling nauseated, breast tenderness and fatigue.  LMP 05/01/14. Patient started new pack ocp on 05/04/14 but just took one pill only and stopped due to recent break up with boyfriend.   Patient has taken home pregnancy tests but they are negative. No vaginal bleeding or abdominal pain.   Patient scheduled for office visit 05/29/14. Patient advised to call back with any concerns. Patient agreeable.

## 2014-05-27 ENCOUNTER — Telehealth: Payer: Self-pay | Admitting: Certified Nurse Midwife

## 2014-05-27 NOTE — Telephone Encounter (Signed)
LMTCB about canceled appointment °

## 2014-05-29 ENCOUNTER — Ambulatory Visit: Payer: BC Managed Care – PPO | Admitting: Certified Nurse Midwife

## 2014-05-29 NOTE — Telephone Encounter (Signed)
Patient canceled her appointment for "pregnancy test" 06/02/14. Patient says she started her cycle and no longer needs this appointment.

## 2014-06-02 ENCOUNTER — Ambulatory Visit: Payer: BC Managed Care – PPO | Admitting: Certified Nurse Midwife

## 2014-07-10 ENCOUNTER — Ambulatory Visit (INDEPENDENT_AMBULATORY_CARE_PROVIDER_SITE_OTHER): Payer: BC Managed Care – PPO | Admitting: Internal Medicine

## 2014-07-10 ENCOUNTER — Encounter: Payer: Self-pay | Admitting: Internal Medicine

## 2014-07-10 VITALS — BP 130/78 | HR 61 | Temp 97.8°F | Ht 69.0 in | Wt 222.8 lb

## 2014-07-10 DIAGNOSIS — L290 Pruritus ani: Secondary | ICD-10-CM

## 2014-07-10 DIAGNOSIS — E669 Obesity, unspecified: Secondary | ICD-10-CM | POA: Insufficient documentation

## 2014-07-10 MED ORDER — LIDOCAINE-HYDROCORTISONE ACE 3-0.5 % RE CREA: 1.0000 | TOPICAL_CREAM | Freq: Two times a day (BID) | RECTAL | Status: DC

## 2014-07-10 NOTE — Progress Notes (Signed)
Pre visit review using our clinic review tool, if applicable. No additional management support is needed unless otherwise documented below in the visit note. 

## 2014-07-10 NOTE — Patient Instructions (Signed)
Anal Pruritus Anal pruritus is an itching of the anus, which is often due to increased moisture of the skin around the anus. Moisture may be due to sweating or a small amount of remaining stool. The itching and scratching can cause further skin damage.  CAUSES   Poor hygiene.  Excessive moisture from sweating or residual stool in the anal area.  Perfumed soaps and sprays and colored toilet paper.  Chemicals in the foods you eat.  Dietary factors such as caffeine, beer, milk products, chocolate, nuts, citrus fruits, tomatoes, spicy seasonings, jalapeno peppers, and salsa.  Hemorrhoids, infections, and other anal diseases.  Excessive washing.  Overuse of laxatives.  Skin disorders (psoriasis, eczema, or seborrhea). HOME CARE INSTRUCTIONS   Practice good hygiene.  Clean the anal area gently with wet toilet paper, baby wipes, or a wet washcloth after every bowel movement and at bedtime. Avoid using soaps on the anal area. Dry the area thoroughly. Pat the area dry with toilet paper or a towel.  Do not scrub the anal area with anything, even toilet paper.  Try not to scratch the itchy area. Scratching produces more damage, which makes the itching worse.  Take sitz baths in warm water for 15 to 20 minutes, 2 to 3 times a day. Pat the area dry with a soft cloth after each bath.  Zinc oxide ointment or a moisture barrier cream can be applied several times daily to protect the skin.  Only take medicines as directed by your caregiver.  Talk to your caregiver about fiber supplements. These are helpful in normalizing the stool if you have frequent loose stools.  Wear cotton underwear and loose clothing.  Do not use irritants such as bubble baths, scented toilet paper, or genital deodorants. SEEK MEDICAL CARE IF:   Itching does not improve in several days or gets worse.  You have a fever.  There are problems with increased pain, swelling, or redness. MAKE SURE YOU:   Understand  these instructions.  Will watch your condition.  Will get help right away if you are not doing well or get worse. Document Released: 11/08/2010 Document Revised: 08/01/2011 Document Reviewed: 11/08/2010 [facility] Patient Information 2015 [place], [place]. This information is not intended to replace advice given to you by your health care provider. Make sure you discuss any questions you have with your health care provider.

## 2014-07-10 NOTE — Assessment & Plan Note (Signed)
Wt Readings from Last 3 Encounters:  07/10/14 222 lb 12 oz (101.039 kg)  03/05/14 216 lb (97.977 kg)  12/25/13 214 lb (97.07 kg)   The patient is asked to make an attempt to improve diet and exercise patterns to aid in medical management of this problem.

## 2014-07-10 NOTE — Progress Notes (Signed)
Subjective:    Patient ID: Joyce Gray, female    DOB: 12/01/1975, 38 y.o.   MRN: [id number]  HPI  Patient here for itch - rectal and vaginal x 3 days ?precipitated by high volume cashew consumption, no prior allergy identified Not associated with bleeding, discharge, open wound Denies anal intercourse or trauma   Past Medical History  Diagnosis Date  . Depression     Review of Systems  Constitutional: Negative for fever, fatigue and unexpected weight change.  Respiratory: Negative for cough and shortness of breath.   Cardiovascular: Negative for chest pain, palpitations and leg swelling.  Gastrointestinal:       No fecal incontinence  Genitourinary: Negative for dysuria, flank pain, vaginal bleeding and vaginal pain.  Hematological: Does not bruise/bleed easily.       Objective:    Physical Exam  Constitutional: She appears well-developed and well-nourished.  obese  Cardiovascular: Normal rate and regular rhythm.   Pulmonary/Chest: Effort normal and breath sounds normal. No respiratory distress.  Genitourinary: Guaiac negative stool. No vaginal discharge found.  Normal rectal tone, no erythema. Nontender, no mass or evidence for hemorrhoids    BP 130/78 mmHg  Pulse 61  Temp(Src) 97.8 F (36.6 C) (Oral)  Ht 5\' 9"  (1.753 m)  Wt 222 lb 12 oz (101.039 kg)  BMI 32.88 kg/m2  SpO2 98%  LMP 06/26/2014 Wt Readings from Last 3 Encounters:  07/10/14 222 lb 12 oz (101.039 kg)  03/05/14 216 lb (97.977 kg)  12/25/13 214 lb (97.07 kg)     Lab Results  Component Value Date   WBC 9.4 11/26/2013   HGB 13.7 11/26/2013   HCT 40.1 11/26/2013   PLT 340.0 11/26/2013   GLUCOSE 94 11/26/2013   CHOL 204* 11/26/2013   TRIG 162.0* 11/26/2013   HDL 40.90 11/26/2013   LDLCALC 131* 11/26/2013   ALT 13 11/26/2013   AST 21 11/26/2013   NA 137 11/26/2013   K 4.7 11/26/2013   CL 102 11/26/2013   CREATININE 0.6 11/26/2013   BUN 12 11/26/2013   CO2 28 11/26/2013   TSH  1.69 11/26/2013    Dg Chest 2 View  12/18/2013   CLINICAL DATA:  Dyspnea at night  EXAM: CHEST  2 VIEW  COMPARISON:  None.  FINDINGS: The heart size and mediastinal contours are within normal limits. Both lungs are clear. The visualized skeletal structures are unremarkable.  IMPRESSION: No active cardiopulmonary disease.   Electronically Signed   By: Skipper Cliche M.D.   On: 12/18/2013 16:37       Assessment & Plan:   Anal pruritus. No evidence for anorectal disease such as abscess, fissure or fistula on exam. No mass. Discussed potential dietary irritants and conservative treatment for hygiene. No exam or history evidence for infection. Okay for short course lido/hydrocortisone to accompanying witch hazel pads for cleansing after bowel movement -prescription provided Patient will call if symptoms unimproved or worse, education provided  Problem List Items Addressed This Visit    Obese    Wt Readings from Last 3 Encounters:  07/10/14 222 lb 12 oz (101.039 kg)  03/05/14 216 lb (97.977 kg)  12/25/13 214 lb (97.07 kg)   The patient is asked to make an attempt to improve diet and exercise patterns to aid in medical management of this problem.        Other Visit Diagnoses    Anal itch    -  Primary        Gwendolyn Grant, MD

## 2014-10-07 IMAGING — CR DG CHEST 2V
2 series · 2 of 2 positions shown · non-contrast
Comparison: None.

CLINICAL DATA: Dyspnea at night

EXAM:
CHEST  2 VIEW

[view not recorded (1 of 2)]
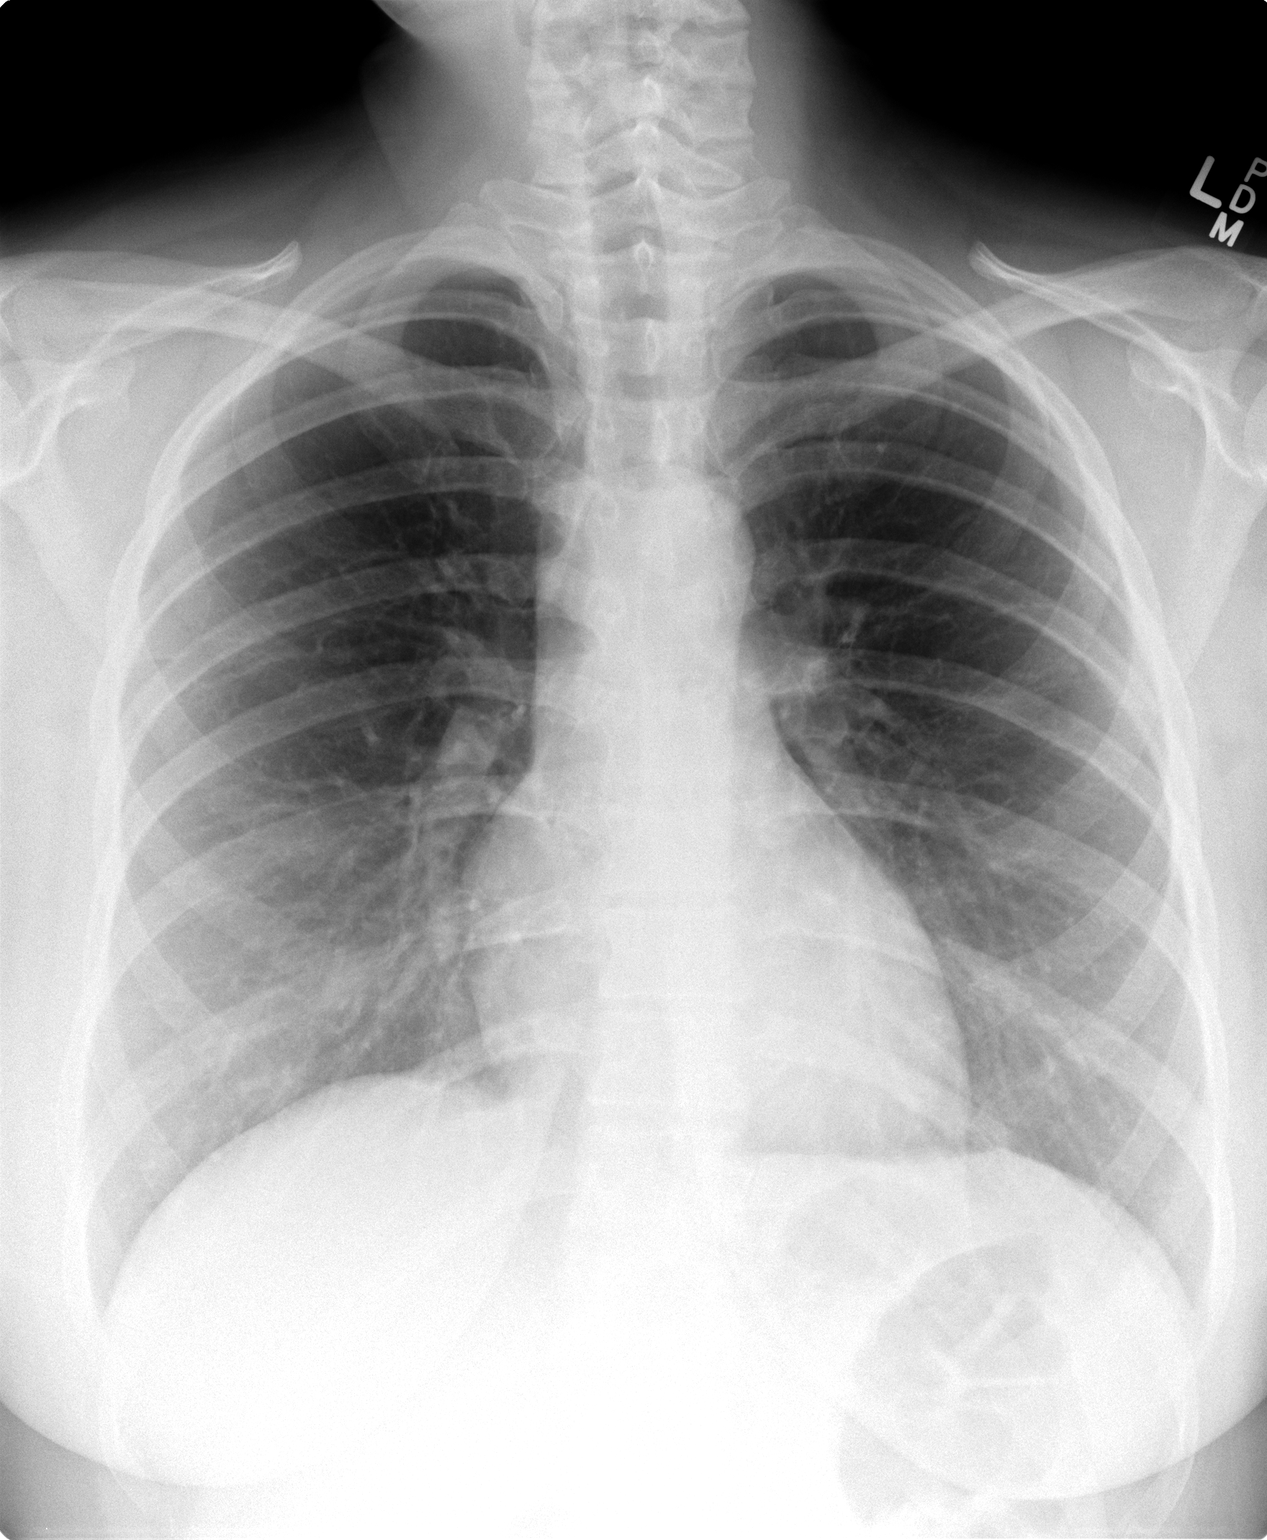

[view not recorded (2 of 2)]
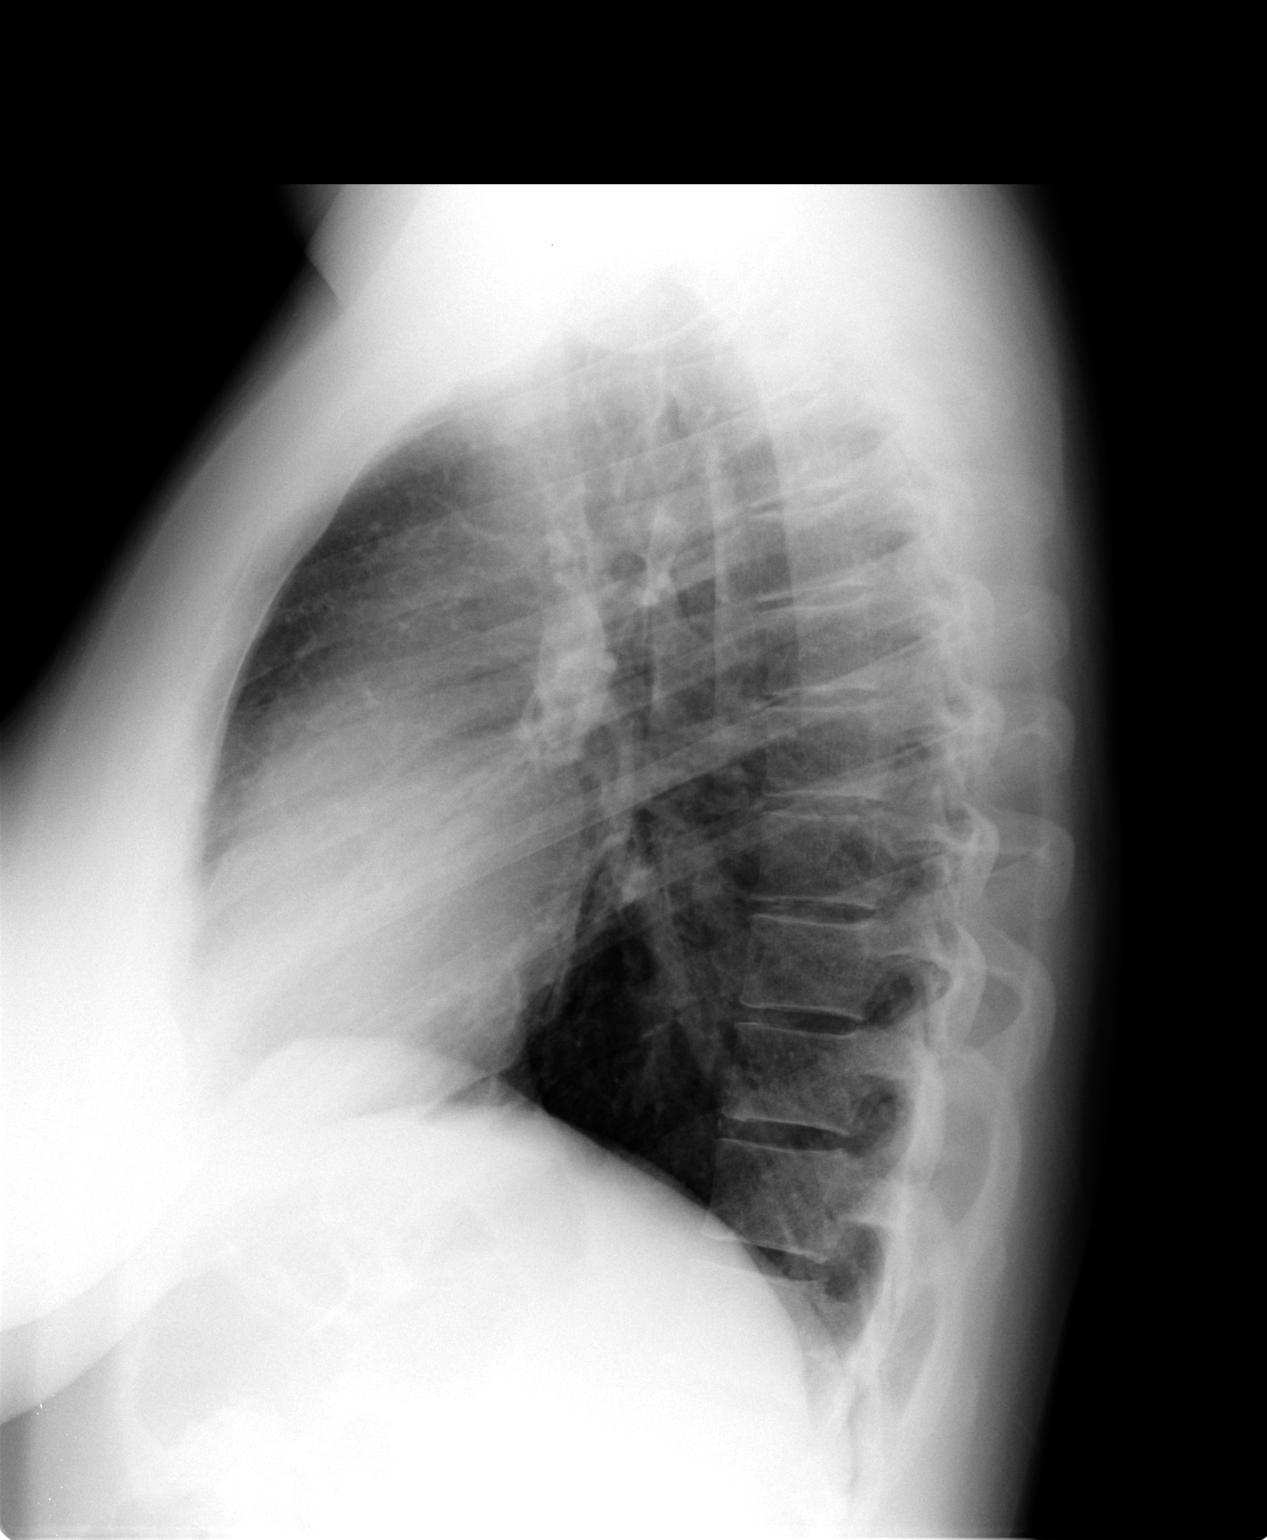

[2 of 2 positions shown; findings below may reference images not displayed]

FINDINGS: The heart size and mediastinal contours are within normal limits.
Both lungs are clear. The visualized skeletal structures are
unremarkable.
IMPRESSION: No active cardiopulmonary disease.

## 2014-10-27 ENCOUNTER — Ambulatory Visit (INDEPENDENT_AMBULATORY_CARE_PROVIDER_SITE_OTHER): Payer: BC Managed Care – PPO | Admitting: Internal Medicine

## 2014-10-27 ENCOUNTER — Other Ambulatory Visit: Payer: Self-pay

## 2014-10-27 ENCOUNTER — Encounter: Payer: Self-pay | Admitting: Internal Medicine

## 2014-10-27 VITALS — BP 118/72 | HR 60 | Temp 98.2°F | Resp 18 | Wt 227.8 lb

## 2014-10-27 DIAGNOSIS — J069 Acute upper respiratory infection, unspecified: Secondary | ICD-10-CM | POA: Diagnosis not present

## 2014-10-27 DIAGNOSIS — J209 Acute bronchitis, unspecified: Secondary | ICD-10-CM

## 2014-10-27 MED ORDER — BENZONATATE 200 MG PO CAPS: 200.0000 mg | ORAL_CAPSULE | Freq: Three times a day (TID) | ORAL | Status: DC | PRN

## 2014-10-27 MED ORDER — AMOXICILLIN 500 MG PO CAPS: 500.0000 mg | ORAL_CAPSULE | Freq: Three times a day (TID) | ORAL | Status: DC

## 2014-10-27 MED ORDER — HYDROCODONE-HOMATROPINE 5-1.5 MG/5ML PO SYRP: 5.0000 mL | ORAL_SOLUTION | Freq: Four times a day (QID) | ORAL | Status: DC | PRN

## 2014-10-27 NOTE — Progress Notes (Signed)
   Subjective:    Patient ID: Joyce Gray, female    DOB: [birth year], 39 y.o.   MRN: [id number]  HPI She was seen for respiratory tract symptoms symptoms  10/14/14 at the [facility] urgent care. She described sore throat, postnasal drainage which was clear-green, & low-grade fever. Strep test was negative. Pseudoephedrine, Mucinex, & cough syrup were recommended  She still has green nasal discharge but this has decreased in amount. She is coughing up green in scant amounts as well. Cough is disturbing sleep.  She describes maxillary sinus pressure, postnasal drainage, scratchy throat, & tender cervical lymph nodes on the left.  She initially had some itchy, watery eyes but these extrinsic symptoms have resolved.    Review of Systems She denies frontal headache, fever, chills, or sweats. The cough is not associated with wheezing or shortness of breath.    Objective:   Physical Exam  General appearance:Adequately nourished; no acute distress or increased work of breathing is present.    Lymphatic: No  lymphadenopathy about the head, neck, or axilla .  Eyes: No conjunctival inflammation or lid edema is present. There is no scleral icterus.  Ears:  External ear exam shows no significant lesions or deformities.  Otoscopic examination reveals clear canals, tympanic membranes are intact bilaterally without bulging, retraction, inflammation or discharge.  Nose:  External nasal examination shows no deformity or inflammation. Nasal mucosa are erythematous , especially on the R , without lesions or exudates No septal dislocation or deviation.No obstruction to airflow.   Oral exam: Dental hygiene is good; lips and gums are healthy appearing.There is no oropharyngeal erythema or exudate .  Neck:  No deformities, thyromegaly, masses, or tenderness noted.   Supple with full range of motion without pain.   Heart:  Normal rate and regular rhythm. S1 and S2 normal without gallop, murmur, click, rub or  other extra sounds.   Lungs:Chest clear to auscultation; no wheezes, rhonchi,rales ,or rubs present.  Extremities:  No cyanosis, edema, or clubbing  noted    Skin: Warm & dry w/o tenting or jaundice. No significant lesions or rash.        Assessment & Plan:  #1 acute bronchitis w/o bronchospasm #2 URI, acute Plan: See orders and recommendations

## 2014-10-27 NOTE — Progress Notes (Signed)
Pre visit review using our clinic review tool, if applicable. No additional management support is needed unless otherwise documented below in the visit note. 

## 2014-10-27 NOTE — Patient Instructions (Addendum)

## 2014-12-02 ENCOUNTER — Encounter: Payer: Self-pay | Admitting: Internal Medicine

## 2014-12-02 ENCOUNTER — Other Ambulatory Visit (INDEPENDENT_AMBULATORY_CARE_PROVIDER_SITE_OTHER): Payer: BC Managed Care – PPO

## 2014-12-02 ENCOUNTER — Ambulatory Visit (INDEPENDENT_AMBULATORY_CARE_PROVIDER_SITE_OTHER): Payer: BC Managed Care – PPO | Admitting: Internal Medicine

## 2014-12-02 VITALS — BP 112/78 | HR 66 | Temp 98.1°F | Resp 18 | Ht 69.0 in | Wt 227.0 lb

## 2014-12-02 DIAGNOSIS — Z0000 Encounter for general adult medical examination without abnormal findings: Secondary | ICD-10-CM

## 2014-12-02 DIAGNOSIS — F329 Major depressive disorder, single episode, unspecified: Secondary | ICD-10-CM

## 2014-12-02 DIAGNOSIS — F32A Depression, unspecified: Secondary | ICD-10-CM

## 2014-12-02 LAB — COMPREHENSIVE METABOLIC PANEL
ALK PHOS: 63 U/L (ref 39–117)
ALT: 18 U/L (ref 0–35)
AST: 21 U/L (ref 0–37)
Albumin: 4.2 g/dL (ref 3.5–5.2)
BUN: 11 mg/dL (ref 6–23)
CO2: 24 meq/L (ref 19–32)
CREATININE: 0.77 mg/dL (ref 0.40–1.20)
Calcium: 9.6 mg/dL (ref 8.4–10.5)
Chloride: 105 mEq/L (ref 96–112)
GFR: 89.07 mL/min (ref >60.00)
GLUCOSE: 92 mg/dL (ref 70–99)
Potassium: 4.3 mEq/L (ref 3.5–5.1)
SODIUM: 140 meq/L (ref 135–145)
TOTAL PROTEIN: 8 g/dL (ref 6.0–8.3)
Total Bilirubin: 0.3 mg/dL (ref 0.2–1.2)

## 2014-12-02 LAB — TSH: TSH: 1.81 u[IU]/mL (ref 0.35–4.50)

## 2014-12-02 LAB — HEMOGLOBIN A1C: HEMOGLOBIN A1C: 5.3 % (ref 4.6–6.5)

## 2014-12-02 LAB — VITAMIN B12: Vitamin B-12: 511 pg/mL (ref 211–911)

## 2014-12-02 MED ORDER — CITALOPRAM HYDROBROMIDE 20 MG PO TABS: ORAL_TABLET | ORAL | Status: DC

## 2014-12-02 NOTE — Patient Instructions (Signed)
We have checked the EKG today which looks normal.   It is perfectly fine to see a psychiatrist for a second opinion. Lexapro and wellbutrin are both very weight neutral although I would recommend that the lexapro is a little better for the anxiety and depression than the wellbutrin.   Some groups that have psychiatrists are [facility] and they have several doctors. You can go on their website. Another option is the [facility] and they also have a website with their doctors so you can pick one that you feel comfortable with.  We will check the labs today and call you back with the results.   Health Maintenance Adopting a healthy lifestyle and getting preventive care can go a long way to promote health and wellness. Talk with your health care provider about what schedule of regular examinations is right for you. This is a good chance for you to check in with your provider about disease prevention and staying healthy. In between checkups, there are plenty of things you can do on your own. Experts have done a lot of research about which lifestyle changes and preventive measures are most likely to keep you healthy. Ask your health care provider for more information. WEIGHT AND DIET  Eat a healthy diet  Be sure to include plenty of vegetables, fruits, low-fat dairy products, and lean protein.  Do not eat a lot of foods high in solid fats, added sugars, or salt.  Get regular exercise. This is one of the most important things you can do for your health.  Most adults should exercise for at least 150 minutes each week. The exercise should increase your heart rate and make you sweat (moderate-intensity exercise).  Most adults should also do strengthening exercises at least twice a week. This is in addition to the moderate-intensity exercise.  Maintain a healthy weight  Body mass index (BMI) is a measurement that can be used to identify possible weight  problems. It estimates body fat based on height and weight. Your health care provider can help determine your BMI and help you achieve or maintain a healthy weight.  For females 21 years of age and older:   A BMI below 18.5 is considered underweight.  A BMI of 18.5 to 24.9 is normal.  A BMI of 25 to 29.9 is considered overweight.  A BMI of 30 and above is considered obese.  Watch levels of cholesterol and blood lipids  You should start having your blood tested for lipids and cholesterol at 38 years of age, then have this test every 5 years.  You may need to have your cholesterol levels checked more often if:  Your lipid or cholesterol levels are high.  You are older than 38 years of age.  You are at high risk for heart disease.  CANCER SCREENING   Lung Cancer  Lung cancer screening is recommended for adults 68-40 years old who are at high risk for lung cancer because of a history of smoking.  A yearly low-dose CT scan of the lungs is recommended for people who:  Currently smoke.  Have quit within the past 15 years.  Have at least a 30-pack-year history of smoking. A pack year is smoking an average of one pack of cigarettes a day for 1 year.  Yearly screening should continue until it has been 15 years since you quit.  Yearly screening should stop if you develop a health problem that would prevent you from having lung  cancer treatment.  Breast Cancer  Practice breast self-awareness. This means understanding how your breasts normally appear and feel.  It also means doing regular breast self-exams. Let your health care provider know about any changes, no matter how small.  If you are in your 20s or 30s, you should have a clinical breast exam (CBE) by a health care provider every 1-3 years as part of a regular health exam.  If you are 65 or older, have a CBE every year. Also consider having a breast X-ray (mammogram) every year.  If you have a family history of breast  cancer, talk to your health care provider about genetic screening.  If you are at high risk for breast cancer, talk to your health care provider about having an MRI and a mammogram every year.  Breast cancer gene (BRCA) assessment is recommended for women who have family members with BRCA-related cancers. BRCA-related cancers include:  Breast.  Ovarian.  Tubal.  Peritoneal cancers.  Results of the assessment will determine the need for genetic counseling and BRCA1 and BRCA2 testing. Cervical Cancer Routine pelvic examinations to screen for cervical cancer are no longer recommended for nonpregnant women who are considered low risk for cancer of the pelvic organs (ovaries, uterus, and vagina) and who do not have symptoms. A pelvic examination may be necessary if you have symptoms including those associated with pelvic infections. Ask your health care provider if a screening pelvic exam is right for you.   The Pap test is the screening test for cervical cancer for women who are considered at risk.  If you had a hysterectomy for a problem that was not cancer or a condition that could lead to cancer, then you no longer need Pap tests.  If you are older than 65 years, and you have had normal Pap tests for the past 10 years, you no longer need to have Pap tests.  If you have had past treatment for cervical cancer or a condition that could lead to cancer, you need Pap tests and screening for cancer for at least 20 years after your treatment.  If you no longer get a Pap test, assess your risk factors if they change (such as having a new sexual partner). This can affect whether you should start being screened again.  Some women have medical problems that increase their chance of getting cervical cancer. If this is the case for you, your health care provider may recommend more frequent screening and Pap tests.  The human papillomavirus (HPV) test is another test that may be used for cervical  cancer screening. The HPV test looks for the virus that can cause cell changes in the cervix. The cells collected during the Pap test can be tested for HPV.  The HPV test can be used to screen women 71 years of age and older. Getting tested for HPV can extend the interval between normal Pap tests from three to five years.  An HPV test also should be used to screen women of any age who have unclear Pap test results.  After 38 years of age, women should have HPV testing as often as Pap tests.  Colorectal Cancer  This type of cancer can be detected and often prevented.  Routine colorectal cancer screening usually begins at 38 years of age and continues through 38 years of age.  Your health care provider may recommend screening at an earlier age if you have risk factors for colon cancer.  Your health care provider may  also recommend using home test kits to check for hidden blood in the stool.  A small camera at the end of a tube can be used to examine your colon directly (sigmoidoscopy or colonoscopy). This is done to check for the earliest forms of colorectal cancer.  Routine screening usually begins at age 28.  Direct examination of the colon should be repeated every 5-10 years through 38 years of age. However, you may need to be screened more often if early forms of precancerous polyps or small growths are found. Skin Cancer  Check your skin from head to toe regularly.  Tell your health care provider about any new moles or changes in moles, especially if there is a change in a mole's shape or color.  Also tell your health care provider if you have a mole that is larger than the size of a pencil eraser.  Always use sunscreen. Apply sunscreen liberally and repeatedly throughout the day.  Protect yourself by wearing long sleeves, pants, a wide-brimmed hat, and sunglasses whenever you are outside. HEART DISEASE, DIABETES, AND HIGH BLOOD PRESSURE   Have your blood pressure checked at  least every 1-2 years. High blood pressure causes heart disease and increases the risk of stroke.  If you are between 78 years and 22 years old, ask your health care provider if you should take aspirin to prevent strokes.  Have regular diabetes screenings. This involves taking a blood sample to check your fasting blood sugar level.  If you are at a normal weight and have a low risk for diabetes, have this test once every three years after 38 years of age.  If you are overweight and have a high risk for diabetes, consider being tested at a younger age or more often. PREVENTING INFECTION  Hepatitis B  If you have a higher risk for hepatitis B, you should be screened for this virus. You are considered at high risk for hepatitis B if:  You were born in a country where hepatitis B is common. Ask your health care provider which countries are considered high risk.  Your parents were born in a high-risk country, and you have not been immunized against hepatitis B (hepatitis B vaccine).  You have HIV or AIDS.  You use needles to inject street drugs.  You live with someone who has hepatitis B.  You have had sex with someone who has hepatitis B.  You get hemodialysis treatment.  You take certain medicines for conditions, including cancer, organ transplantation, and autoimmune conditions. Hepatitis C  Blood testing is recommended for:  Everyone born from [birth year] through 1964.  Anyone with known risk factors for hepatitis C. Sexually transmitted infections (STIs)  You should be screened for sexually transmitted infections (STIs) including gonorrhea and chlamydia if:  You are sexually active and are younger than 38 years of age.  You are older than 38 years of age and your health care provider tells you that you are at risk for this type of infection.  Your sexual activity has changed since you were last screened and you are at an increased risk for chlamydia or gonorrhea. Ask your health  care provider if you are at risk.  If you do not have HIV, but are at risk, it may be recommended that you take a prescription medicine daily to prevent HIV infection. This is called pre-exposure prophylaxis (PrEP). You are considered at risk if:  You are sexually active and do not regularly use condoms or know the HIV  status of your partner(s).  You take drugs by injection.  You are sexually active with a partner who has HIV. Talk with your health care provider about whether you are at high risk of being infected with HIV. If you choose to begin PrEP, you should first be tested for HIV. You should then be tested every 3 months for as long as you are taking PrEP.  PREGNANCY   If you are premenopausal and you may become pregnant, ask your health care provider about preconception counseling.  If you may become pregnant, take 400 to 800 micrograms (mcg) of folic acid every day.  If you want to prevent pregnancy, talk to your health care provider about birth control (contraception). OSTEOPOROSIS AND MENOPAUSE   Osteoporosis is a disease in which the bones lose minerals and strength with aging. This can result in serious bone fractures. Your risk for osteoporosis can be identified using a bone density scan.  If you are 20 years of age or older, or if you are at risk for osteoporosis and fractures, ask your health care provider if you should be screened.  Ask your health care provider whether you should take a calcium or vitamin D supplement to lower your risk for osteoporosis.  Menopause may have certain physical symptoms and risks.  Hormone replacement therapy may reduce some of these symptoms and risks. Talk to your health care provider about whether hormone replacement therapy is right for you.  HOME CARE INSTRUCTIONS   Schedule regular health, dental, and eye exams.  Stay current with your immunizations.   Do not use any tobacco products including cigarettes, chewing tobacco, or  electronic cigarettes.  If you are pregnant, do not drink alcohol.  If you are breastfeeding, limit how much and how often you drink alcohol.  Limit alcohol intake to no more than 1 drink per day for nonpregnant women. One drink equals 12 ounces of beer, 5 ounces of wine, or 1 ounces of hard liquor.  Do not use street drugs.  Do not share needles.  Ask your health care provider for help if you need support or information about quitting drugs.  Tell your health care provider if you often feel depressed.  Tell your health care provider if you have ever been abused or do not feel safe at home. Document Released: 11/22/2010 Document Revised: 09/23/2013 Document Reviewed: 04/10/2013 [facility] Patient Information 2015 [place], [place]. This information is not intended to replace advice given to you by your health care provider. Make sure you discuss any questions you have with your health care provider.

## 2014-12-02 NOTE — Progress Notes (Signed)
   Subjective:    Patient ID: Joyce Gray, female    DOB: 06-05-1975, 39 y.o.   MRN: [id number]  HPI The patient is a 39 YO female coming in for wellness. No new complaints.   PMH, [facility], social history reviewed and updated.   Review of Systems  Constitutional: Negative for fever, activity change, appetite change, fatigue and unexpected weight change.  HENT: Negative.   Eyes: Negative.   Respiratory: Negative for cough, chest tightness, shortness of breath and wheezing.   Cardiovascular: Negative for chest pain, palpitations and leg swelling.  Gastrointestinal: Negative for nausea, abdominal pain, diarrhea, constipation and abdominal distention.  Musculoskeletal: Negative.   Skin: Negative.   Neurological: Negative.   Psychiatric/Behavioral: Negative.       Objective:   Physical Exam Filed Vitals:   12/02/14 1346  BP: 112/78  Pulse: 66  Temp: 98.1 F (36.7 C)  TempSrc: Oral  Resp: 18  Height: 5\' 9"  (1.753 m)  Weight: 227 lb (102.967 kg)  SpO2: 98%   EKG: no prior for comparison, sinus normal rate 57, no ST or t wave changes, intervals normal    Assessment & Plan:

## 2014-12-02 NOTE — Assessment & Plan Note (Signed)
Given alternative to celexa due to she thinking it is causing weight gain. Also given name of several psychiatrists in town for second opinion about this.

## 2014-12-02 NOTE — Assessment & Plan Note (Addendum)
EKG ordered per her request and normal. Checking labs. Advised that it is a good goal to work on diet and exercise to work on weight loss. Talked to her about sun safety as well to decrease her future risk of skin cancer.

## 2014-12-02 NOTE — Progress Notes (Signed)
Pre visit review using our clinic review tool, if applicable. No additional management support is needed unless otherwise documented below in the visit note. 

## 2014-12-11 ENCOUNTER — Encounter: Payer: BC Managed Care – PPO | Admitting: Internal Medicine

## 2014-12-30 ENCOUNTER — Encounter: Payer: Self-pay | Admitting: Certified Nurse Midwife

## 2014-12-30 ENCOUNTER — Ambulatory Visit (INDEPENDENT_AMBULATORY_CARE_PROVIDER_SITE_OTHER): Payer: BC Managed Care – PPO | Admitting: Certified Nurse Midwife

## 2014-12-30 VITALS — BP 102/68 | HR 68 | Resp 16 | Ht 68.75 in | Wt 225.0 lb

## 2014-12-30 DIAGNOSIS — Z01419 Encounter for gynecological examination (general) (routine) without abnormal findings: Secondary | ICD-10-CM

## 2014-12-30 DIAGNOSIS — Z124 Encounter for screening for malignant neoplasm of cervix: Secondary | ICD-10-CM | POA: Diagnosis not present

## 2014-12-30 DIAGNOSIS — Z3041 Encounter for surveillance of contraceptive pills: Secondary | ICD-10-CM | POA: Diagnosis not present

## 2014-12-30 DIAGNOSIS — Z0000 Encounter for general adult medical examination without abnormal findings: Secondary | ICD-10-CM

## 2014-12-30 LAB — CBC
HCT: 37.9 % (ref 36.0–46.0)
Hemoglobin: 12.8 g/dL (ref 12.0–15.0)
MCH: 29.3 pg (ref 26.0–34.0)
MCHC: 33.8 g/dL (ref 30.0–36.0)
MCV: 86.7 fL (ref 78.0–100.0)
MPV: 9 fL (ref 8.6–12.4)
PLATELETS: 401 10*3/uL — AB (ref 150–400)
RBC: 4.37 MIL/uL (ref 3.87–5.11)
RDW: 14 % (ref 11.5–15.5)
WBC: 10.8 10*3/uL — ABNORMAL HIGH (ref 4.0–10.5)

## 2014-12-30 MED ORDER — NORGESTIMATE-ETH ESTRADIOL 0.25-35 MG-MCG PO TABS: 1.0000 | ORAL_TABLET | Freq: Every day | ORAL | Status: DC

## 2014-12-30 NOTE — Patient Instructions (Signed)

## 2014-12-30 NOTE — Progress Notes (Signed)
39 y.o. G0P0000 Single  Caucasian Fe here for annual exam. Periods normal, no issues. Contraception working well. Sexually active now for the past 9 months. Desires STD screening. Also desires Lipid panel screening and CBC. Sees PCP for aex and some labs and depression management. Feels she needs medication change and plans to see Psychiatrist. Working on weight loss with diet and exercise now. No other health concerns today.  Patient's last menstrual period was 12/10/2014.          Sexually active: Yes.    The current method of family planning is OCP (estrogen/progesterone).    Exercising: Yes.    cardio & weights Smoker:  no  Health Maintenance: Pap: 12-25-13 neg HPV HR neg MMG:  2012 mammo & left breast u/s birads 1:neg Colonoscopy:  none BMD:   none TDaP:  2010 Labs: none Self breast exam: not done   reports that she has never smoked. She has never used smokeless tobacco. She reports that she drinks about 0.6 oz of alcohol per week. She reports that she does not use illicit drugs.  Past Medical History  Diagnosis Date  . Depression   . Abnormal Pap smear of cervix 1999  . Anxiety     Past Surgical History  Procedure Laterality Date  . No past surgeries    . Cryotherapy  1999    for abnormal pap smear  . Tympanostomy tube placement      twice as a child    Current Outpatient Prescriptions  Medication Sig Dispense Refill  . citalopram (CELEXA) 20 MG tablet TAKE 1 TABLET BY MOUTH EVERY DAY 90 tablet 3  . Multiple Vitamins-Minerals (MULTIVITAMIN PO) Take by mouth daily.    . norgestimate-ethinyl estradiol (SPRINTEC 28) 0.25-35 MG-MCG tablet Take 1 tablet by mouth daily. 1 Package 11   No current facility-administered medications for this visit.    Family History  Problem Relation Age of Onset  . Hyperlipidemia Mother   . Alcohol abuse Mother   . Melanoma Father   . Heart disease Maternal Grandfather   . Breast cancer Maternal Aunt 63    mastectomy  . Cancer Paternal  Aunt 51    melanoma    ROS:  Pertinent items are noted in HPI.  Otherwise, a comprehensive ROS was negative.  Exam:   BP 102/68 mmHg  Pulse 68  Resp 16  Ht 5' 8.75" (1.746 m)  Wt 225 lb (102.059 kg)  BMI 33.48 kg/m2  LMP 12/10/2014 Height: 5' 8.75" (174.6 cm) Ht Readings from Last 3 Encounters:  12/30/14 5' 8.75" (1.746 m)  12/02/14 5\' 9"  (1.753 m)  07/10/14 5\' 9"  (1.753 m)    General appearance: alert, cooperative and appears stated age Head: Normocephalic, without obvious abnormality, atraumatic Neck: no adenopathy, supple, symmetrical, trachea midline and thyroid normal to inspection and palpation Lungs: clear to auscultation bilaterally Breasts: normal appearance, no masses or tenderness, No nipple retraction or dimpling, No nipple discharge or bleeding, No axillary or supraclavicular adenopathy Heart: regular rate and rhythm Abdomen: soft, non-tender; no masses,  no organomegaly Extremities: extremities normal, atraumatic, no cyanosis or edema Skin: Skin color, texture, turgor normal. No rashes or lesions Lymph nodes: Cervical, supraclavicular, and axillary nodes normal. No abnormal inguinal nodes palpated Neurologic: Grossly normal   Pelvic: External genitalia:  no lesions              Urethra:  normal appearing urethra with no masses, tenderness or lesions  Bartholin's and Skene's: normal                 Vagina: normal appearing vagina with normal color and discharge, no lesions              Cervix: normal, non tender,no lesions              Pap taken: Yes.   Bimanual Exam:  Uterus:  normal size, contour, position, consistency, mobility, non-tender and mid position              Adnexa: normal adnexa and no mass, fullness, tenderness               Rectovaginal: Confirms               Anus:  normal sphincter tone, no lesions  Chaperone present: Yes  A:  Well Woman with normal exam  Contraception OCP desired  STD screening and screening  labs  Depression with PCP management, plans change to Psychiatrist  Weight loss program in progress  P:   Reviewed health and wellness pertinent to exam  Rx Sprintec see order  Labs: GC,Chlamydia, HIV,RPR, Hep C, HSV 1,2  Lipid panel, CBC  Given information of MD's in area and patient will need to call for appointment.  Continue on her journey with weight loss, partner supportive  Pap smear taken with HPV reflex   counseled on breast self exam, STD prevention, HIV risk factors and prevention, use and side effects of OCP's, adequate intake of calcium and vitamin D, diet and exercise  return annually or prn  An After Visit Summary was printed and given to the patient.

## 2014-12-31 LAB — HSV(HERPES SIMPLEX VRS) I + II AB-IGG: HSV 2 Glycoprotein G Ab, IgG: <0.1 IV

## 2014-12-31 LAB — LIPID PANEL
CHOL/HDL RATIO: 4.4 ratio (ref <=5.0)
Cholesterol: 244 mg/dL — ABNORMAL HIGH (ref 125–200)
HDL: 55 mg/dL (ref >=46)
LDL Cholesterol: 134 mg/dL — ABNORMAL HIGH (ref <130)
TRIGLYCERIDES: 276 mg/dL — AB (ref <150)
VLDL: 55 mg/dL — ABNORMAL HIGH (ref <30)

## 2014-12-31 LAB — HEPATITIS C ANTIBODY: HCV Ab: NEGATIVE

## 2014-12-31 LAB — HIV ANTIBODY (ROUTINE TESTING W REFLEX): HIV 1&2 Ab, 4th Generation: NONREACTIVE

## 2014-12-31 LAB — RPR

## 2014-12-31 NOTE — Progress Notes (Signed)
Reviewed personally.  M. Suzanne Marixa Mellott, MD.  

## 2015-01-01 ENCOUNTER — Telehealth: Payer: Self-pay

## 2015-01-01 LAB — IPS N GONORRHOEA AND CHLAMYDIA BY PCR

## 2015-01-01 NOTE — Telephone Encounter (Signed)
-----   Message from Regina Eck, CNM sent at 01/01/2015  7:59 AM EDT ----- Notify patient that Gc,Chlamydia, HIV, RPR, Hep. C, Herpes 1,2 are negative Cholesterol panel is elevated and feel she should follow up with PCP for management. She will need copy of results for PCP to review. CBC shows slight white count elevation which should be rechecked in one week, if she is seeing PCP at that time can do there, if not will need follow up here, did not put order in until status clarified.

## 2015-01-01 NOTE — Telephone Encounter (Signed)
Spoke with patient. Advised of results and message as seen below from [facility]. Patient is agreeable to date and time. Patient would like to be seen with out office for follow up. Appointment scheduled for 8/18 at 1:30pm. Patient is agreeable to date and time. Labs from 12/30/2014 OV faxed to [facility] at [phone] with cover sheet and confirmation.  Routing to provider for final review. Patient agreeable to disposition. Will close encounter.   Patient aware provider will review message and nurse will return call if any additional advice or change of disposition.

## 2015-01-02 ENCOUNTER — Telehealth: Payer: Self-pay | Admitting: Certified Nurse Midwife

## 2015-01-02 LAB — IPS PAP TEST WITH REFLEX TO HPV

## 2015-01-02 NOTE — Telephone Encounter (Signed)
Patient says she is waiting on her labs to be released to her [facility] portal.

## 2015-01-02 NOTE — Telephone Encounter (Signed)
Spoke with patient. Advised I have released all lab results from 12/30/2014 appointment. Patient is agreeable and states she is able to see her results at this time. Patient was given results yesterday 01/01/2015. Please see that telephone encounter.  Routing to provider for final review. Patient agreeable to disposition. Will close encounter.

## 2015-01-06 ENCOUNTER — Other Ambulatory Visit: Payer: Self-pay | Admitting: Certified Nurse Midwife

## 2015-01-06 ENCOUNTER — Telehealth: Payer: Self-pay | Admitting: Emergency Medicine

## 2015-01-06 DIAGNOSIS — R87810 Cervical high risk human papillomavirus (HPV) DNA test positive: Principal | ICD-10-CM

## 2015-01-06 DIAGNOSIS — R87610 Atypical squamous cells of undetermined significance on cytologic smear of cervix (ASC-US): Secondary | ICD-10-CM

## 2015-01-06 NOTE — Telephone Encounter (Signed)
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 Patient history of cryo. Order in, please schedule with DL

## 2015-01-06 NOTE — Telephone Encounter (Signed)
Patient notified of message from Regina Eck CNM.  She is agreeable to scheduling colposcopy. LMP 12/10/14, expects next cycle today or tomorrow, taking oral contraceptive daily.  Brief description of procedure given to patient.  Colposcopy pre-procedure instructions given. Discussed menses and need to not have any bleeding on day of appointment, advised to call to reschedule if starts cycle.  Make sure to eat a meal and hydrate before appointment.  Advised 800 mg of Motrin with food one hour prior to appointment.   Patient verbalized understanding of preprocedure instructions and will call to reschedule if will be on menses or has any concerns regarding pregnancy.  Patient is advised she will be contacted with insurance coverage information.           cc Kerry Hough for insurance pre certification.  Scheduled for 01/15/15 at 1400

## 2015-01-08 ENCOUNTER — Other Ambulatory Visit (INDEPENDENT_AMBULATORY_CARE_PROVIDER_SITE_OTHER): Payer: BC Managed Care – PPO

## 2015-01-08 ENCOUNTER — Other Ambulatory Visit: Payer: Self-pay | Admitting: Certified Nurse Midwife

## 2015-01-08 DIAGNOSIS — R899 Unspecified abnormal finding in specimens from other organs, systems and tissues: Secondary | ICD-10-CM

## 2015-01-08 LAB — CBC WITH DIFFERENTIAL/PLATELET
BASOS ABS: 0 10*3/uL (ref 0.0–0.1)
Basophils Relative: 0 % (ref 0–1)
EOS ABS: 0.4 10*3/uL (ref 0.0–0.7)
EOS PCT: 4 % (ref 0–5)
HEMATOCRIT: 37.2 % (ref 36.0–46.0)
Hemoglobin: 12.6 g/dL (ref 12.0–15.0)
LYMPHS ABS: 4.1 10*3/uL — AB (ref 0.7–4.0)
LYMPHS PCT: 41 % (ref 12–46)
MCH: 29.6 pg (ref 26.0–34.0)
MCHC: 33.9 g/dL (ref 30.0–36.0)
MCV: 87.3 fL (ref 78.0–100.0)
MONOS PCT: 5 % (ref 3–12)
MPV: 9.3 fL (ref 8.6–12.4)
Monocytes Absolute: 0.5 10*3/uL (ref 0.1–1.0)
Neutro Abs: 5 10*3/uL (ref 1.7–7.7)
Neutrophils Relative %: 50 % (ref 43–77)
PLATELETS: 363 10*3/uL (ref 150–400)
RBC: 4.26 MIL/uL (ref 3.87–5.11)
RDW: 14 % (ref 11.5–15.5)
WBC: 10 10*3/uL (ref 4.0–10.5)

## 2015-01-13 ENCOUNTER — Telehealth: Payer: Self-pay | Admitting: Certified Nurse Midwife

## 2015-01-13 NOTE — Telephone Encounter (Signed)
Spoke with patient. Reviewed benefit for procedure. Patient understood and agreeable. Verified arrival date/time for procedure. Patient agreeable. Ok to close. °

## 2015-01-15 ENCOUNTER — Ambulatory Visit (INDEPENDENT_AMBULATORY_CARE_PROVIDER_SITE_OTHER): Payer: BC Managed Care – PPO | Admitting: Certified Nurse Midwife

## 2015-01-15 ENCOUNTER — Encounter: Payer: Self-pay | Admitting: Certified Nurse Midwife

## 2015-01-15 VITALS — BP 110/62 | HR 80 | Resp 16 | Ht 68.75 in | Wt 228.0 lb

## 2015-01-15 DIAGNOSIS — Z01812 Encounter for preprocedural laboratory examination: Secondary | ICD-10-CM | POA: Diagnosis not present

## 2015-01-15 DIAGNOSIS — R87810 Cervical high risk human papillomavirus (HPV) DNA test positive: Secondary | ICD-10-CM

## 2015-01-15 DIAGNOSIS — R87610 Atypical squamous cells of undetermined significance on cytologic smear of cervix (ASC-US): Secondary | ICD-10-CM | POA: Diagnosis not present

## 2015-01-15 LAB — POCT URINE PREGNANCY: Preg Test, Ur: NEGATIVE

## 2015-01-15 NOTE — Patient Instructions (Signed)

## 2015-01-15 NOTE — Progress Notes (Signed)
Pt took 800mg  ibuprofen at 1:20pm Past hx of cryotherapy 12-30-14 pap ASCUS HPV HR +

## 2015-01-15 NOTE — Progress Notes (Addendum)
Patient ID: Joyce Gray, female   DOB: 1975/06/01, 39 y.o.   MRN: [id number]  Chief Complaint  Patient presents with  . Colonoscopy    Took Ibuprofen 800mg  at 1:20pm// RB    HPI Joyce Gray is a 39 y.o. single g0p0 female.  Here for colposcopy exam.Denies vaginal bleeding or pelvic pain.  HPI  Indications: Pap smear on 8/9 2016 showed: ASCUS with POSITIVE high risk HPV. Previous colposcopy: 1999 for severe dysplasia. Prior cervical treatment: cryosurgery.  Past Medical History  Diagnosis Date  . Depression   . Abnormal Pap smear of cervix 1999  . Anxiety     Past Surgical History  Procedure Laterality Date  . No past surgeries    . Cryotherapy  1999    for abnormal pap smear  . Tympanostomy tube placement      twice as a child    Family History  Problem Relation Age of Onset  . Hyperlipidemia Mother   . Alcohol abuse Mother   . Melanoma Father   . Heart disease Maternal Grandfather   . Breast cancer Maternal Aunt 63    mastectomy  . Cancer Paternal Aunt 79    melanoma    Social History Social History  Substance Use Topics  . Smoking status: Never Smoker   . Smokeless tobacco: Never Used  . Alcohol Use: 0.6 oz/week    1 Standard drinks or equivalent per week    No Known Allergies  Current Outpatient Prescriptions  Medication Sig Dispense Refill  . citalopram (CELEXA) 20 MG tablet TAKE 1 TABLET BY MOUTH EVERY DAY (Patient taking differently: Take 10 mg by mouth daily. At night) 90 tablet 3  . Multiple Vitamins-Minerals (MULTIVITAMIN PO) Take by mouth daily.    Marland Kitchen PARoxetine (PAXIL) 20 MG tablet TAKE 1/2 TABLET BY MOUTH ONCE DAILY X1 WEEK, THEN INCREASE TO 1 TAB ONCE DAILY THEREAFTER  1  . norgestimate-ethinyl estradiol (SPRINTEC 28) 0.25-35 MG-MCG tablet Take 1 tablet by mouth daily. (Patient not taking: Reported on 01/15/2015) 1 Package 12   No current facility-administered medications for this visit.    Review of Systems Review of Systems    Constitutional: Negative.   Genitourinary: Negative for vaginal bleeding, vaginal discharge and vaginal pain.    Blood pressure 110/62, pulse 80, resp. rate 16, height 5' 8.75" (1.746 m), weight 228 lb (103.42 kg), last menstrual period 01/06/2015.  Physical Exam Physical Exam  Constitutional: She is oriented to person, place, and time. She appears well-developed and well-nourished.  Genitourinary: Vagina normal.    Neurological: She is alert and oriented to person, place, and time.  Psychiatric: She has a normal mood and affect. Her behavior is normal. Judgment and thought content normal.    Data Reviewed Reviewed pap smear results and HPVHR etiology. Questions addressed regarding transmission and sexual activity.  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 Procedure Details  The risks and benefits of the procedure and Written informed consent obtained.  Speculum placed in vagina and excellent visualization of cervix achieved, cervix swabbed x 3 with saline and  acetic acid solution. Acetowhite noted at 3 o'clock. Lugols applied and non staining also noted in same area. Biopsy taken at 3 o'clock after viewing with 3.75, 7.5, 15 # and green filter. ECC taken. Monsel's applied. No active bleeding noted on speculum removal. Patient tolerated procedure well. Instructions given.  Patient became a little pale in color and reclined, had beverage and crackers and felt better. Ambulated without difficulty. Felt  fine upon leaving room. BP 110/70 P 72 R 18 color good  Specimens: 2  Complications: none.     Plan    Specimens labelled and sent to Pathology. Patient will be called when results in and reviewed..  Pathology reviewed with biopsy showing LSIL and ECC shoing benign endocervical glands, negative for atypia or malignancy. Correlates well with pap smear. Patient will be notified of results and need for follow up in one year with pap smear. Pap  recall   [facility] 01/15/2015, 2:34 PM

## 2015-01-16 NOTE — Progress Notes (Signed)
Reviewed personally.  M. Suzanne Vaniah Chambers, MD.  

## 2015-01-19 LAB — IPS OTHER TISSUE BIOPSY

## 2015-01-20 ENCOUNTER — Telehealth: Payer: Self-pay | Admitting: Certified Nurse Midwife

## 2015-01-20 NOTE — Telephone Encounter (Signed)
Patient calling for recent colposcopy results.

## 2015-01-20 NOTE — Telephone Encounter (Signed)
Spoke with patient. Results given as seen below. Patient is agreeable and verbalizes understanding. Aex scheduled for 12/31/2015. 08 recall entered.  Notes Recorded by Regina Eck, CNM on 01/20/2015 at 8:08 AM Notify patient that biopsy results showed LSIL and ECC showed benign endocervical glands, negative for atypia or malignancy. Findings correlates well with pap smear. Repeat pap one year very important 08 Pap recall  Routing to provider for final review. Patient agreeable to disposition. Will close encounter.

## 2015-02-15 ENCOUNTER — Other Ambulatory Visit: Payer: Self-pay | Admitting: Internal Medicine

## 2015-12-31 ENCOUNTER — Ambulatory Visit (INDEPENDENT_AMBULATORY_CARE_PROVIDER_SITE_OTHER): Payer: BC Managed Care – PPO | Admitting: Certified Nurse Midwife

## 2015-12-31 ENCOUNTER — Encounter: Payer: Self-pay | Admitting: Certified Nurse Midwife

## 2015-12-31 VITALS — BP 110/70 | HR 72 | Resp 16 | Ht 68.75 in | Wt 235.0 lb

## 2015-12-31 DIAGNOSIS — R309 Painful micturition, unspecified: Secondary | ICD-10-CM

## 2015-12-31 DIAGNOSIS — Z0000 Encounter for general adult medical examination without abnormal findings: Secondary | ICD-10-CM

## 2015-12-31 DIAGNOSIS — Z124 Encounter for screening for malignant neoplasm of cervix: Secondary | ICD-10-CM

## 2015-12-31 DIAGNOSIS — Z01419 Encounter for gynecological examination (general) (routine) without abnormal findings: Secondary | ICD-10-CM

## 2015-12-31 LAB — TSH: TSH: 2.47 m[IU]/L

## 2015-12-31 LAB — LIPID PANEL
CHOLESTEROL: 208 mg/dL — AB (ref 125–200)
HDL: 44 mg/dL — ABNORMAL LOW (ref >=46)
LDL CALC: 135 mg/dL — AB (ref <130)
TRIGLYCERIDES: 146 mg/dL (ref <150)
Total CHOL/HDL Ratio: 4.7 Ratio (ref <=5.0)
VLDL: 29 mg/dL (ref <30)

## 2015-12-31 NOTE — Progress Notes (Signed)
40 y.o. G0P0000 Single  Caucasian Fe here for annual exam. Periods normal, no issues. Sexually active no partner change. No STD concerns. Having urinary tingling again, has had all her life, no dysuria, frequency or urgency. Usually just goes away. Has increased her soda and caffeine intake and very little water. Denies fever, chills or back pain. Sees PCP prn. Screening labs today if needed. No other health issues today.   Patient's last menstrual period was 12/16/2015.          Sexually active: Yes.    The current method of family planning is withdrawal & rhythm method.    Exercising: Yes.    gym,running, weights Smoker:  no  Health Maintenance: Pap:  12-30-14 ASCUS HPV HR +, colpo 01-15-15 LGSIL MMG:  2012 mammo & left breast u/s birads 1:neg Colonoscopy:  none BMD:   none TDaP:  2010 Shingles: no Pneumonia: no Hep C and HIV: HIV neg 2016 Labs: none Self breast exam: not done   reports that she has never smoked. She has never used smokeless tobacco. She reports that she drinks alcohol. She reports that she does not use drugs.  Past Medical History:  Diagnosis Date  . Abnormal Pap smear of cervix 1999  . Anxiety   . Depression     Past Surgical History:  Procedure Laterality Date  . CRYOTHERAPY  1999   for abnormal pap smear  . NO PAST SURGERIES    . TYMPANOSTOMY TUBE PLACEMENT     twice as a child    Current Outpatient Prescriptions  Medication Sig Dispense Refill  . escitalopram (LEXAPRO) 20 MG tablet     . Multiple Vitamins-Minerals (MULTIVITAMIN PO) Take by mouth daily.    Marland Kitchen OLIVE LEAF EXTRACT PO Take by mouth.     No current facility-administered medications for this visit.     Family History  Problem Relation Age of Onset  . Hyperlipidemia Mother   . Alcohol abuse Mother   . Melanoma Father   . Heart disease Maternal Grandfather   . Breast cancer Maternal Aunt 63    mastectomy  . Cancer Paternal Aunt 35    melanoma    ROS:  Pertinent items are noted in  HPI.  Otherwise, a comprehensive ROS was negative.  Exam:   BP 110/70   Pulse 72   Resp 16   Ht 5' 8.75" (1.746 m)   Wt 235 lb (106.6 kg)   LMP 12/16/2015   BMI 34.96 kg/m  Height: 5' 8.75" (174.6 cm) Ht Readings from Last 3 Encounters:  12/31/15 5' 8.75" (1.746 m)  01/15/15 5' 8.75" (1.746 m)  12/30/14 5' 8.75" (1.746 m)    General appearance: alert, cooperative and appears stated age Head: Normocephalic, without obvious abnormality, atraumatic Neck: no adenopathy, supple, symmetrical, trachea midline and thyroid normal to inspection and palpation Lungs: clear to auscultation bilaterally CVAT; negative bilateral Breasts: normal appearance, no masses or tenderness, No nipple retraction or dimpling, No nipple discharge or bleeding, No axillary or supraclavicular adenopathy Heart: regular rate and rhythm Abdomen: soft, non-tender; no masses,  no organomegaly, negative suprapubic Extremities: extremities normal, atraumatic, no cyanosis or edema Skin: Skin color, texture, turgor normal. No rashes or lesions Lymph nodes: Cervical, supraclavicular, and axillary nodes normal. No abnormal inguinal nodes palpated Neurologic: Grossly normal   Pelvic: External genitalia:  no lesions              Urethra:  normal appearing urethra with no masses, tenderness or lesions  Bladder,  urethral meatus non tender              Bartholin's and Skene's: normal                 Vagina: normal appearing vagina with normal color and discharge, no lesions              Cervix: no cervical motion tenderness, no lesions and bleeding with pap only, normal appearance              Pap taken: Yes.   Bimanual Exam:  Uterus:  normal size, contour, position, consistency, mobility, non-tender              Adnexa: normal adnexa and no mass, fullness, tenderness               Rectovaginal: Confirms               Anus:  normal sphincter tone, no lesions  Chaperone present: yes  A:  Well Woman with normal  exam  Contraception withdrawal, NFP  Follow pap smear from ASCUS and colpo LSIL 2016 today  R/O UTI  Screening labs  P:   Reviewed health and wellness pertinent to exam  Discussed if pap smear normal, will need repeat in one year, if not per results  Reviewed warning signs of UTI and need to advise. Increase water intake and decrease soda and tea.  LAB: Urine culture  Lab: Lipid panel, TSH, Vit. D  Pap smear as above with HPV reflex   counseled on breast self exam, mammography screening, adequate intake of calcium and vitamin D, diet and exercise  return annually or prn  An After Visit Summary was printed and given to the patient.

## 2015-12-31 NOTE — Patient Instructions (Signed)

## 2016-01-01 LAB — URINE CULTURE

## 2016-01-01 LAB — VITAMIN D 25 HYDROXY (VIT D DEFICIENCY, FRACTURES): VIT D 25 HYDROXY: 35 ng/mL (ref 30–100)

## 2016-01-04 LAB — IPS PAP TEST WITH REFLEX TO HPV

## 2016-01-05 NOTE — Progress Notes (Signed)
Encounter reviewed Jill Jertson, MD   

## 2016-05-30 ENCOUNTER — Telehealth: Payer: Self-pay | Admitting: Certified Nurse Midwife

## 2016-05-30 NOTE — Telephone Encounter (Signed)
Patient called with questions for the nurse.  She said, "I'd like to find out how to get tested for fertility."

## 2016-05-30 NOTE — Telephone Encounter (Signed)
Spoke with patient. Patient would like to discuss fertility. Would like to try for pregnancy in the summer and is worried about chance for pregnancy with age. Would like to have labs done and discuss fertility. Appointment scheduled for 06/06/2016 at 10 am with Dr.Miller. Patient is agreeable to date, time, and to see Dr.Miller.  Cc: [name] CNM   lRouting to provider for final review. Patient agreeable to disposition. Will close encounter.

## 2016-06-06 ENCOUNTER — Ambulatory Visit (INDEPENDENT_AMBULATORY_CARE_PROVIDER_SITE_OTHER): Payer: BC Managed Care – PPO | Admitting: Obstetrics & Gynecology

## 2016-06-06 ENCOUNTER — Encounter: Payer: Self-pay | Admitting: Obstetrics & Gynecology

## 2016-06-06 ENCOUNTER — Ambulatory Visit: Payer: BC Managed Care – PPO | Admitting: Obstetrics & Gynecology

## 2016-06-06 VITALS — BP 118/62 | HR 76 | Resp 16 | Ht 69.0 in | Wt 239.0 lb

## 2016-06-06 DIAGNOSIS — N938 Other specified abnormal uterine and vaginal bleeding: Secondary | ICD-10-CM

## 2016-06-06 DIAGNOSIS — Z319 Encounter for procreative management, unspecified: Secondary | ICD-10-CM

## 2016-06-06 DIAGNOSIS — Z0184 Encounter for antibody response examination: Secondary | ICD-10-CM

## 2016-06-06 NOTE — Progress Notes (Signed)
63 yrs Caucasian Single female G0P0000 here for discussion of desires to be pregnant.  She and significant other are planning a summer wedding and she would like to start trying immediately.  Would like to be prepared at that time and came to discuss what testing should (if any) be done at this time.  Pt is SA and using withdrawal method for contraception.  She is not taking a PNV.  States cycles are normal but when she describes this, cycles can be 24-30.  Flow is typically about five days and regular despite how close or far apart cycles are.    Joyce Gray  has not fathered children.    PMed hx:  negative PSurg Hx: cryosurgery, tubes in early/back surgery Smoker: No  Spouse PMed Hx: negative Spouse PSurg hx:  negative Spouse smokes: Yes  D/w pt components of fertility evaluation including testing for ovulation, semen analysis testing, and evaluation of cavity of uterus and for tubal patency.  Speed of this evaluation and proceeding with fertility medication will depend on desires and pt and her fiancee.  Also, due to age, feel should test for ovarian reserve as well.  Pt is a bit overwhelmed by all of the information given to her today.  We did review it all thoroughly a second time and it was all written down for her as well.  All of this was written down for pt and she clearly understands the plan.  Questions invited and answered.    Assessment:  DUB, desires for pregnancy  Plan:   1.  Day 3 FSH testing.  Pt will call with onset of cycle.  Clearly understands this can only be done on day 1.  2.  Pt will do OPT/OPK this next month.  Pt will call with results.   3.  Semen analysis kit and order given.  4.  Pt understands she may need to use Clomid or Femara if ovulation testing is negative.  After discussion, would plan to use Femara 7.76m days 5-9 of cycle.  5.  D/w pt SHGM to evaluate endometrium as well as tubal patency.  Would recommend this within 3 months of starting ovulation  induction agents.  Pt understands this is done timed with menstrual cycle days 1-9.  6.  Highly recommend starting PNV.  Pt voices understanding.    7.  Rubella testing recommended.  Pt will do this when does Day 3 FSH.  8.  D/W pt Lexapro and stopping, if possible, with +UPT.  ~30 minutes spent with patient >50% of time was in face to face discussion of above.

## 2016-06-06 NOTE — Patient Instructions (Addendum)
Day 3 FSH testing.  You need to call when your cycles starts.    Consider doing a month or two of ovulation predictor kits (OPK or OPT).  What I'm looking for is ovulation around day 13  Consider doing a semen analysis.  You have the "kit".  Consider the ultrasound where I assess your fallopian tubes--sonohysterogram  Start prenatal vitamin or folic acid [id number].  You don't the iron PNV.    Rubella testing will be done the same day as your day 3 [facility]

## 2016-06-20 ENCOUNTER — Other Ambulatory Visit (INDEPENDENT_AMBULATORY_CARE_PROVIDER_SITE_OTHER): Payer: BC Managed Care – PPO

## 2016-06-20 DIAGNOSIS — Z0184 Encounter for antibody response examination: Secondary | ICD-10-CM

## 2016-06-20 DIAGNOSIS — N938 Other specified abnormal uterine and vaginal bleeding: Secondary | ICD-10-CM

## 2016-06-21 ENCOUNTER — Ambulatory Visit (INDEPENDENT_AMBULATORY_CARE_PROVIDER_SITE_OTHER): Payer: BC Managed Care – PPO

## 2016-06-21 DIAGNOSIS — Z23 Encounter for immunization: Secondary | ICD-10-CM | POA: Diagnosis not present

## 2016-06-21 LAB — RUBELLA SCREEN: RUBELLA: 1.57 {index} — AB (ref <0.90)

## 2016-06-21 LAB — FOLLICLE STIMULATING HORMONE: FSH: 7.1 m[IU]/mL

## 2016-10-26 ENCOUNTER — Encounter: Payer: Self-pay | Admitting: Family

## 2016-10-26 ENCOUNTER — Ambulatory Visit (INDEPENDENT_AMBULATORY_CARE_PROVIDER_SITE_OTHER): Payer: BC Managed Care – PPO | Admitting: Family

## 2016-10-26 VITALS — BP 122/84 | HR 68 | Temp 99.3°F | Resp 16 | Ht 69.0 in | Wt 236.0 lb

## 2016-10-26 DIAGNOSIS — J069 Acute upper respiratory infection, unspecified: Secondary | ICD-10-CM

## 2016-10-26 MED ORDER — AMOXICILLIN-POT CLAVULANATE 875-125 MG PO TABS: 1.0000 | ORAL_TABLET | Freq: Two times a day (BID) | ORAL | 0 refills | Status: DC

## 2016-10-26 NOTE — Patient Instructions (Signed)
Thank you for choosing [facility].  SUMMARY AND INSTRUCTIONS:  Medication:  Your prescription(s) have been submitted to your pharmacy or been printed and provided for you. Please take as directed and contact our office if you believe you are having problem(s) with the medication(s) or have any questions.  Follow up:  If your symptoms worsen or fail to improve, please contact our office for further instruction, or in case of emergency go directly to the emergency room at the closest medical facility.    General Recommendations:    Please drink plenty of fluids.  Get plenty of rest   Sleep in humidified air  Use saline nasal sprays  Netti pot   OTC Medications:  Decongestants - helps relieve congestion   Flonase (generic fluticasone) or Nasacort (generic triamcinolone) - please make sure to use the "cross-over" technique at a 45 degree angle towards the opposite eye as opposed to straight up the nasal passageway.   Sudafed (generic pseudoephedrine - Note this is the one that is available behind the pharmacy counter); Products with phenylephrine (-PE) may also be used but is often not as effective as pseudoephedrine.   If you have HIGH BLOOD PRESSURE - Coricidin HBP; AVOID any product that is -D as this contains pseudoephedrine which may increase your blood pressure.  Afrin (oxymetazoline) every 6-8 hours for up to 3 days.   Allergies - helps relieve runny nose, itchy eyes and sneezing   Claritin (generic loratidine), Allegra (fexofenidine), or Zyrtec (generic cyrterizine) for runny nose. These medications should not cause drowsiness.  Note - Benadryl (generic diphenhydramine) may be used however may cause drowsiness  Cough -   Delsym or Robitussin (generic dextromethorphan)  Expectorants - helps loosen mucus to ease removal   Mucinex (generic guaifenesin) as directed on the package.  Headaches / General Aches   Tylenol (generic acetaminophen) - DO NOT  EXCEED 3 grams (3,000 mg) in a 24 hour time period  Advil/Motrin (generic ibuprofen)   Sore Throat -   Salt water gargle   Chloraseptic (generic benzocaine) spray or lozenges / Sucrets (generic dyclonine)     

## 2016-10-26 NOTE — Assessment & Plan Note (Signed)
Symptoms and exam consistent with acute upper respiratory infection with possible sinusitis. Start Augmentin. Continue over-the-counter medications as needed for symptom relief and supportive care. Follow-up if symptoms worsen or do not improve.

## 2016-10-26 NOTE — Progress Notes (Signed)
Subjective:    Patient ID: Joyce Gray, female    DOB: [birth year], 41 y.o.   MRN: [id number]  Chief Complaint  Patient presents with  . Sore Throat    sore throat, productive cough, diarrhea, fever, eyes red and watery x5 days    HPI:  Joyce Gray is a 41 y.o. female who  has a past medical history of Abnormal Pap smear of cervix (1999); Anxiety; and Depression. and presents today for an acute office visit.   This is a new problem. Associated symptoms of sore throat, productive cough, and red/watery eyes have been going on for about 5 days. Endorses new onset stomach pain and diarrhea for about 1 day which has resolved. Low grade increased temperature of 99.3 this morning. Course of the symptoms has stayed about the same since initial onset. Modifying factors include alka seltzer cold/flu, Peptobisol, and Advil Cold/flu. No sick contacts.   No Known Allergies     Outpatient Medications Prior to Visit  Medication Sig Dispense Refill  . escitalopram (LEXAPRO) 10 MG tablet Take 10 mg by mouth daily.     . Multiple Vitamins-Minerals (MULTIVITAMIN PO) Take by mouth daily.    Marland Kitchen OLIVE LEAF EXTRACT PO Take by mouth.     No facility-administered medications prior to visit.      Past Medical History:  Diagnosis Date  . Abnormal Pap smear of cervix 1999  . Anxiety   . Depression      Review of Systems  Constitutional: Positive for fever. Negative for chills.  HENT: Positive for congestion, facial swelling, sinus pressure and sore throat. Negative for ear pain.   Respiratory: Positive for cough. Negative for chest tightness, shortness of breath and wheezing.   Neurological: Negative for headaches.      Objective:    BP 122/84 (BP Location: Left Arm, Patient Position: Sitting, Cuff Size: Large)   Pulse 68   Temp 99.3 F (37.4 C) (Oral)   Resp 16   Ht 5\' 9"  (1.753 m)   Wt 236 lb (107 kg)   SpO2 98%   BMI 34.85 kg/m  Nursing note and vital signs  reviewed.  Physical Exam  Constitutional: She is oriented to person, place, and time. She appears well-developed and well-nourished. No distress.  HENT:  Right Ear: Hearing, tympanic membrane, external ear and ear canal normal.  Left Ear: Hearing, tympanic membrane, external ear and ear canal normal.  Nose: Right sinus exhibits maxillary sinus tenderness. Right sinus exhibits no frontal sinus tenderness. Left sinus exhibits maxillary sinus tenderness. Left sinus exhibits no frontal sinus tenderness.  Mouth/Throat: Uvula is midline, oropharynx is clear and moist and mucous membranes are normal.  No evidence of infection but fluid noted behind ears.  Cardiovascular: Normal rate, regular rhythm, normal heart sounds and intact distal pulses.   Pulmonary/Chest: Effort normal and breath sounds normal.  Neurological: She is alert and oriented to person, place, and time.  Skin: Skin is warm and dry.  Psychiatric: She has a normal mood and affect. Her behavior is normal. Judgment and thought content normal.       Assessment & Plan:   Problem List Items Addressed This Visit      Respiratory   Acute upper respiratory infection - Primary    Symptoms and exam consistent with acute upper respiratory infection with possible sinusitis. Start Augmentin. Continue over-the-counter medications as needed for symptom relief and supportive care. Follow-up if symptoms worsen or do not improve.  I am having Ms. Maricela Bo start on amoxicillin-clavulanate. I am also having her maintain her Multiple Vitamins-Minerals (MULTIVITAMIN PO), escitalopram, and OLIVE LEAF EXTRACT PO.   Meds ordered this encounter  Medications  . amoxicillin-clavulanate (AUGMENTIN) 875-125 MG tablet    Sig: Take 1 tablet by mouth 2 (two) times daily.    Dispense:  14 tablet    Refill:  0    Order Specific Question:   Supervising Provider    Answer:   Pricilla Holm A [[id number]]     Follow-up: Return if symptoms  worsen or fail to improve.  Mauricio Po, FNP

## 2016-12-01 ENCOUNTER — Telehealth: Payer: Self-pay | Admitting: Family

## 2016-12-01 NOTE — Telephone Encounter (Signed)
Left patient vm to call back.  °

## 2016-12-01 NOTE — Telephone Encounter (Signed)
Yes that is fine

## 2016-12-01 NOTE — Telephone Encounter (Signed)
Patient would like to know if she could establish with Marya Amsler?  She would like her first appt to be a CPE.

## 2016-12-08 ENCOUNTER — Other Ambulatory Visit (INDEPENDENT_AMBULATORY_CARE_PROVIDER_SITE_OTHER): Payer: BC Managed Care – PPO

## 2016-12-08 ENCOUNTER — Encounter: Payer: Self-pay | Admitting: Family

## 2016-12-08 ENCOUNTER — Ambulatory Visit (INDEPENDENT_AMBULATORY_CARE_PROVIDER_SITE_OTHER): Payer: BC Managed Care – PPO | Admitting: Family

## 2016-12-08 VITALS — BP 118/78 | HR 73 | Temp 98.9°F | Resp 16 | Ht 69.0 in | Wt 236.0 lb

## 2016-12-08 DIAGNOSIS — E6609 Other obesity due to excess calories: Secondary | ICD-10-CM

## 2016-12-08 DIAGNOSIS — Z0000 Encounter for general adult medical examination without abnormal findings: Secondary | ICD-10-CM

## 2016-12-08 DIAGNOSIS — Z6834 Body mass index (BMI) 34.0-34.9, adult: Secondary | ICD-10-CM | POA: Diagnosis not present

## 2016-12-08 DIAGNOSIS — R7989 Other specified abnormal findings of blood chemistry: Secondary | ICD-10-CM

## 2016-12-08 LAB — CBC
HCT: 39.3 % (ref 36.0–46.0)
Hemoglobin: 13.1 g/dL (ref 12.0–15.0)
MCHC: 33.3 g/dL (ref 30.0–36.0)
MCV: 88.1 fl (ref 78.0–100.0)
Platelets: 369 10*3/uL (ref 150.0–400.0)
RBC: 4.47 Mil/uL (ref 3.87–5.11)
RDW: 14.1 % (ref 11.5–15.5)
WBC: 10.7 10*3/uL — AB (ref 4.0–10.5)

## 2016-12-08 LAB — LIPID PANEL
CHOLESTEROL: 197 mg/dL (ref 0–200)
HDL: 41.3 mg/dL (ref >39.00)
NonHDL: 155.85
TRIGLYCERIDES: 203 mg/dL — AB (ref 0.0–149.0)
Total CHOL/HDL Ratio: 5
VLDL: 40.6 mg/dL — AB (ref 0.0–40.0)

## 2016-12-08 LAB — COMPREHENSIVE METABOLIC PANEL
ALBUMIN: 4 g/dL (ref 3.5–5.2)
ALK PHOS: 71 U/L (ref 39–117)
ALT: 10 U/L (ref 0–35)
AST: 14 U/L (ref 0–37)
BILIRUBIN TOTAL: 0.3 mg/dL (ref 0.2–1.2)
BUN: 11 mg/dL (ref 6–23)
CO2: 25 mEq/L (ref 19–32)
CREATININE: 0.71 mg/dL (ref 0.40–1.20)
Calcium: 9.5 mg/dL (ref 8.4–10.5)
Chloride: 105 mEq/L (ref 96–112)
GFR: 96.79 mL/min (ref >60.00)
GLUCOSE: 92 mg/dL (ref 70–99)
Potassium: 4.3 mEq/L (ref 3.5–5.1)
SODIUM: 137 meq/L (ref 135–145)
TOTAL PROTEIN: 7.3 g/dL (ref 6.0–8.3)

## 2016-12-08 LAB — HEMOGLOBIN A1C: HEMOGLOBIN A1C: 5.4 % (ref 4.6–6.5)

## 2016-12-08 LAB — LDL CHOLESTEROL, DIRECT: Direct LDL: 128 mg/dL

## 2016-12-08 NOTE — Assessment & Plan Note (Signed)
BMI of 34.85. Recommend weight loss of 5-10% of current body weight. Recommend increasing physical activity to 30 minutes of moderate level activity daily. Encourage nutritional intake that focuses on nutrient dense foods and is moderate, varied, and balanced and is low in saturated fats and processed/sugary foods. Continue to monitor.

## 2016-12-08 NOTE — Patient Instructions (Addendum)
Thank you for choosing [facility].  SUMMARY AND INSTRUCTIONS:  Keep up the great work!  They will call to schedule your information session.   Continue to take your medications as prescribed.   Labs:  Please stop by the lab on the lower level of the building for your blood work. Your results will be released to [facility] (or called to you) after review, usually within 72 hours after test completion. If any changes need to be made, you will be notified at that same time.  1.) The lab is open from 7:30am to 5:30 pm Monday-Friday 2.) No appointment is necessary 3.) Fasting (if needed) is 6-8 hours after food and drink; black coffee and water are okay   Follow up:  If your symptoms worsen or fail to improve, please contact our office for further instruction, or in case of emergency go directly to the emergency room at the closest medical facility.    Health Maintenance, Female Adopting a healthy lifestyle and getting preventive care can go a long way to promote health and wellness. Talk with your health care provider about what schedule of regular examinations is right for you. This is a good chance for you to check in with your provider about disease prevention and staying healthy. In between checkups, there are plenty of things you can do on your own. Experts have done a lot of research about which lifestyle changes and preventive measures are most likely to keep you healthy. Ask your health care provider for more information. Weight and diet Eat a healthy diet  Be sure to include plenty of vegetables, fruits, low-fat dairy products, and lean protein.  Do not eat a lot of foods high in solid fats, added sugars, or salt.  Get regular exercise. This is one of the most important things you can do for your health. ? Most adults should exercise for at least 150 minutes each week. The exercise should increase your heart rate and make you sweat (moderate-intensity exercise). ? Most  adults should also do strengthening exercises at least twice a week. This is in addition to the moderate-intensity exercise.  Maintain a healthy weight  Body mass index (BMI) is a measurement that can be used to identify possible weight problems. It estimates body fat based on height and weight. Your health care provider can help determine your BMI and help you achieve or maintain a healthy weight.  For females 22 years of age and older: ? A BMI below 18.5 is considered underweight. ? A BMI of 18.5 to 24.9 is normal. ? A BMI of 25 to 29.9 is considered overweight. ? A BMI of 30 and above is considered obese.  Watch levels of cholesterol and blood lipids  You should start having your blood tested for lipids and cholesterol at 40 years of age, then have this test every 5 years.  You may need to have your cholesterol levels checked more often if: ? Your lipid or cholesterol levels are high. ? You are older than 40 years of age. ? You are at high risk for heart disease.  Cancer screening Lung Cancer  Lung cancer screening is recommended for adults 4-43 years old who are at high risk for lung cancer because of a history of smoking.  A yearly low-dose CT scan of the lungs is recommended for people who: ? Currently smoke. ? Have quit within the past 15 years. ? Have at least a 30-pack-year history of smoking. A pack year is smoking an  average of one pack of cigarettes a day for 1 year.  Yearly screening should continue until it has been 15 years since you quit.  Yearly screening should stop if you develop a health problem that would prevent you from having lung cancer treatment.  Breast Cancer  Practice breast self-awareness. This means understanding how your breasts normally appear and feel.  It also means doing regular breast self-exams. Let your health care provider know about any changes, no matter how small.  If you are in your 20s or 30s, you should have a clinical breast  exam (CBE) by a health care provider every 1-3 years as part of a regular health exam.  If you are 49 or older, have a CBE every year. Also consider having a breast X-ray (mammogram) every year.  If you have a family history of breast cancer, talk to your health care provider about genetic screening.  If you are at high risk for breast cancer, talk to your health care provider about having an MRI and a mammogram every year.  Breast cancer gene (BRCA) assessment is recommended for women who have family members with BRCA-related cancers. BRCA-related cancers include: ? Breast. ? Ovarian. ? Tubal. ? Peritoneal cancers.  Results of the assessment will determine the need for genetic counseling and BRCA1 and BRCA2 testing.  Cervical Cancer Your health care provider may recommend that you be screened regularly for cancer of the pelvic organs (ovaries, uterus, and vagina). This screening involves a pelvic examination, including checking for microscopic changes to the surface of your cervix (Pap test). You may be encouraged to have this screening done every 3 years, beginning at age 54.  For women ages 50-65, health care providers may recommend pelvic exams and Pap testing every 3 years, or they may recommend the Pap and pelvic exam, combined with testing for human papilloma virus (HPV), every 5 years. Some types of HPV increase your risk of cervical cancer. Testing for HPV may also be done on women of any age with unclear Pap test results.  Other health care providers may not recommend any screening for nonpregnant women who are considered low risk for pelvic cancer and who do not have symptoms. Ask your health care provider if a screening pelvic exam is right for you.  If you have had past treatment for cervical cancer or a condition that could lead to cancer, you need Pap tests and screening for cancer for at least 20 years after your treatment. If Pap tests have been discontinued, your risk factors  (such as having a new sexual partner) need to be reassessed to determine if screening should resume. Some women have medical problems that increase the chance of getting cervical cancer. In these cases, your health care provider may recommend more frequent screening and Pap tests.  Colorectal Cancer  This type of cancer can be detected and often prevented.  Routine colorectal cancer screening usually begins at 40 years of age and continues through 40 years of age.  Your health care provider may recommend screening at an earlier age if you have risk factors for colon cancer.  Your health care provider may also recommend using home test kits to check for hidden blood in the stool.  A small camera at the end of a tube can be used to examine your colon directly (sigmoidoscopy or colonoscopy). This is done to check for the earliest forms of colorectal cancer.  Routine screening usually begins at age 50.  Direct examination of the  colon should be repeated every 5-10 years through 40 years of age. However, you may need to be screened more often if early forms of precancerous polyps or small growths are found.  Skin Cancer  Check your skin from head to toe regularly.  Tell your health care provider about any new moles or changes in moles, especially if there is a change in a mole's shape or color.  Also tell your health care provider if you have a mole that is larger than the size of a pencil eraser.  Always use sunscreen. Apply sunscreen liberally and repeatedly throughout the day.  Protect yourself by wearing long sleeves, pants, a wide-brimmed hat, and sunglasses whenever you are outside.  Heart disease, diabetes, and high blood pressure  High blood pressure causes heart disease and increases the risk of stroke. High blood pressure is more likely to develop in: ? People who have blood pressure in the high end of the normal range (130-139/85-89 mm Hg). ? People who are overweight or  obese. ? People who are African American.  If you are 60-69 years of age, have your blood pressure checked every 3-5 years. If you are 84 years of age or older, have your blood pressure checked every year. You should have your blood pressure measured twice-once when you are at a hospital or clinic, and once when you are not at a hospital or clinic. Record the average of the two measurements. To check your blood pressure when you are not at a hospital or clinic, you can use: ? An automated blood pressure machine at a pharmacy. ? A home blood pressure monitor.  If you are between 74 years and 56 years old, ask your health care provider if you should take aspirin to prevent strokes.  Have regular diabetes screenings. This involves taking a blood sample to check your fasting blood sugar level. ? If you are at a normal weight and have a low risk for diabetes, have this test once every three years after 40 years of age. ? If you are overweight and have a high risk for diabetes, consider being tested at a younger age or more often. Preventing infection Hepatitis B  If you have a higher risk for hepatitis B, you should be screened for this virus. You are considered at high risk for hepatitis B if: ? You were born in a country where hepatitis B is common. Ask your health care provider which countries are considered high risk. ? Your parents were born in a high-risk country, and you have not been immunized against hepatitis B (hepatitis B vaccine). ? You have HIV or AIDS. ? You use needles to inject street drugs. ? You live with someone who has hepatitis B. ? You have had sex with someone who has hepatitis B. ? You get hemodialysis treatment. ? You take certain medicines for conditions, including cancer, organ transplantation, and autoimmune conditions.  Hepatitis C  Blood testing is recommended for: ? Everyone born from [birth year] through 1964. ? Anyone with known risk factors for hepatitis  C.  Sexually transmitted infections (STIs)  You should be screened for sexually transmitted infections (STIs) including gonorrhea and chlamydia if: ? You are sexually active and are younger than 40 years of age. ? You are older than 40 years of age and your health care provider tells you that you are at risk for this type of infection. ? Your sexual activity has changed since you were last screened and you are at an  increased risk for chlamydia or gonorrhea. Ask your health care provider if you are at risk.  If you do not have HIV, but are at risk, it may be recommended that you take a prescription medicine daily to prevent HIV infection. This is called pre-exposure prophylaxis (PrEP). You are considered at risk if: ? You are sexually active and do not regularly use condoms or know the HIV status of your partner(s). ? You take drugs by injection. ? You are sexually active with a partner who has HIV.  Talk with your health care provider about whether you are at high risk of being infected with HIV. If you choose to begin PrEP, you should first be tested for HIV. You should then be tested every 3 months for as long as you are taking PrEP. Pregnancy  If you are premenopausal and you may become pregnant, ask your health care provider about preconception counseling.  If you may become pregnant, take 400 to 800 micrograms (mcg) of folic acid every day.  If you want to prevent pregnancy, talk to your health care provider about birth control (contraception). Osteoporosis and menopause  Osteoporosis is a disease in which the bones lose minerals and strength with aging. This can result in serious bone fractures. Your risk for osteoporosis can be identified using a bone density scan.  If you are 40 years of age or older, or if you are at risk for osteoporosis and fractures, ask your health care provider if you should be screened.  Ask your health care provider whether you should take a calcium or  vitamin D supplement to lower your risk for osteoporosis.  Menopause may have certain physical symptoms and risks.  Hormone replacement therapy may reduce some of these symptoms and risks. Talk to your health care provider about whether hormone replacement therapy is right for you. Follow these instructions at home:  Schedule regular health, dental, and eye exams.  Stay current with your immunizations.  Do not use any tobacco products including cigarettes, chewing tobacco, or electronic cigarettes.  If you are pregnant, do not drink alcohol.  If you are breastfeeding, limit how much and how often you drink alcohol.  Limit alcohol intake to no more than 1 drink per day for nonpregnant women. One drink equals 12 ounces of beer, 5 ounces of wine, or 1 ounces of hard liquor.  Do not use street drugs.  Do not share needles.  Ask your health care provider for help if you need support or information about quitting drugs.  Tell your health care provider if you often feel depressed.  Tell your health care provider if you have ever been abused or do not feel safe at home. This information is not intended to replace advice given to you by your health care provider. Make sure you discuss any questions you have with your health care provider. Document Released: 11/22/2010 Document Revised: 10/15/2015 Document Reviewed: 02/10/2015 Elsevier Interactive Patient Education  [employer].

## 2016-12-08 NOTE — Progress Notes (Signed)
Subjective:    Patient ID: Joyce Gray, female    DOB: [birth year], 41 y.o.   MRN: [id number]  Chief Complaint  Patient presents with  . CPE    not fasting    HPI:  Joyce Gray is a 41 y.o. female who presents today for an annual wellness visit.   1) Health Maintenance -   Diet - Averaging about 3 meals per day with snacks consisting of a regular diet; Working on a MyFitnessPal; Caffeine intake of about 2-3 cups daily  Exercise - 3-4x per week consisting of cardio and resistance training    2) Preventative Exams / Immunizations:  Dental -- Up to date  Vision -- Up to date   Health Maintenance  Topic Date Due  . INFLUENZA VACCINE  12/21/2016  . TETANUS/TDAP  05/23/2018  . PAP SMEAR  12/31/2018  . HIV Screening  Completed    Immunization History  Administered Date(s) Administered  . Influenza,inj,Quad PF,36+ Mos 02/18/2013, 06/21/2016  . Influenza-Unspecified 02/20/2014  . Td 05/23/2008     No Known Allergies   Outpatient Medications Prior to Visit  Medication Sig Dispense Refill  . escitalopram (LEXAPRO) 10 MG tablet Take 20 mg by mouth daily.     . Multiple Vitamins-Minerals (MULTIVITAMIN PO) Take by mouth daily.    Marland Kitchen OLIVE LEAF EXTRACT PO Take by mouth.    Marland Kitchen amoxicillin-clavulanate (AUGMENTIN) 875-125 MG tablet Take 1 tablet by mouth 2 (two) times daily. 14 tablet 0   No facility-administered medications prior to visit.      Past Medical History:  Diagnosis Date  . Abnormal Pap smear of cervix 1999  . Anxiety   . Depression      Past Surgical History:  Procedure Laterality Date  . CRYOTHERAPY  1999   for abnormal pap smear  . NO PAST SURGERIES    . TYMPANOSTOMY TUBE PLACEMENT     twice as a child     Family History  Problem Relation Age of Onset  . Hyperlipidemia Mother   . Alcohol abuse Mother   . Melanoma Father   . Cancer Paternal Aunt 64       melanoma  . Heart disease Maternal Grandfather   . Breast cancer Maternal  Aunt 63       mastectomy     Social History   Social History  . Marital status: Single    Spouse name: N/A  . Number of children: 0  . Years of education: 20   Occupational History  . Teacher [employer]   Social History Main Topics  . Smoking status: Never Smoker  . Smokeless tobacco: Never Used  . Alcohol use Yes     Comment: 1 every 2 weeks  . Drug use: No  . Sexual activity: Yes    Partners: Male    Birth control/ protection: Other-see comments, Rhythm     Comment: withdrawal   Other Topics Concern  . Not on file   Social History Narrative   Single   Spanish teacher   Fun/Hobby: Hiking, reading, coffee with friends, dancing.       Review of Systems  Constitutional: Denies fever, chills, fatigue, or significant weight gain/loss. HENT: Head: Denies headache or neck pain Ears: Denies changes in hearing, ringing in ears, earache, drainage Nose: Denies discharge, stuffiness, itching, nosebleed, sinus pain Throat: Denies sore throat, hoarseness, dry mouth, sores, thrush Eyes: Denies loss/changes in vision, pain, redness, blurry/double vision, flashing lights Cardiovascular: Denies chest pain/discomfort, tightness, palpitations, shortness  of breath with activity, difficulty lying down, swelling, sudden awakening with shortness of breath Respiratory: Denies shortness of breath, cough, sputum production, wheezing Gastrointestinal: Denies dysphasia, heartburn, change in appetite, nausea, change in bowel habits, rectal bleeding, constipation, diarrhea, yellow skin or eyes Genitourinary: Denies frequency, urgency, burning/pain, blood in urine, incontinence, change in urinary strength. Musculoskeletal: Denies muscle/joint pain, stiffness, back pain, redness or swelling of joints, trauma Skin: Denies rashes, lumps, itching, dryness, color changes, or hair/nail changes Neurological: Denies dizziness, fainting, seizures, weakness, numbness, tingling, tremor Psychiatric  - Denies nervousness, stress, depression or memory loss Endocrine: Denies heat or cold intolerance, sweating, frequent urination, excessive thirst, changes in appetite Hematologic: Denies ease of bruising or bleeding     Objective:     BP 118/78 (BP Location: Left Arm, Patient Position: Sitting, Cuff Size: Large)   Pulse 73   Temp 98.9 F (37.2 C) (Oral)   Resp 16   Ht 5\' 9"  (1.753 m)   Wt 236 lb (107 kg)   SpO2 98%   BMI 34.85 kg/m  Nursing note and vital signs reviewed.  Physical Exam  Constitutional: She is oriented to person, place, and time. She appears well-developed and well-nourished. No distress.  HENT:  Head: Normocephalic.  Right Ear: Hearing, tympanic membrane, external ear and ear canal normal.  Left Ear: Hearing, tympanic membrane, external ear and ear canal normal.  Nose: Nose normal.  Mouth/Throat: Uvula is midline, oropharynx is clear and moist and mucous membranes are normal.  Eyes: Pupils are equal, round, and reactive to light. Conjunctivae and EOM are normal.  Neck: Neck supple. No JVD present. No tracheal deviation present. No thyromegaly present.  Cardiovascular: Normal rate, regular rhythm, normal heart sounds and intact distal pulses.   Pulmonary/Chest: Effort normal and breath sounds normal.  Abdominal: Soft. Bowel sounds are normal. She exhibits no distension and no mass. There is no tenderness. There is no rebound and no guarding.  Musculoskeletal: Normal range of motion. She exhibits no edema or tenderness.  Lymphadenopathy:    She has no cervical adenopathy.  Neurological: She is alert and oriented to person, place, and time. She has normal reflexes. No cranial nerve deficit. She exhibits normal muscle tone. Coordination normal.  Skin: Skin is warm and dry.  Psychiatric: She has a normal mood and affect. Her behavior is normal. Judgment and thought content normal.       Assessment & Plan:   Problem List Items Addressed This Visit      Other     Obese    BMI of 34.85. Recommend weight loss of 5-10% of current body weight. Recommend increasing physical activity to 30 minutes of moderate level activity daily. Encourage nutritional intake that focuses on nutrient dense foods and is moderate, varied, and balanced and is low in saturated fats and processed/sugary foods. Continue to monitor.        Relevant Orders   Amb Ref to Medical Weight Management   Routine general medical examination at a health care facility - Primary    1) Anticipatory Guidance: Discussed importance of wearing a seatbelt while driving and not texting while driving; changing batteries in smoke detector at least once annually; wearing suntan lotion when outside; eating a balanced and moderate diet; getting physical activity at least 30 minutes per day.  2) Immunizations / Screenings / Labs:  All immunizations are up-to-date per recommendations. Cervical cancer screening is up-to-date. All other screenings are up-to-date per recommendations.Obtain CBC, A1c, CMET, and lipid profile.  Overall well exam with risk factors for cardiovascular disease including obesity. Recommend weight loss of 5-10% of current body weight through nutrition and physical activity. Increase physical activity to 30 minutes of moderate level activity daily or approximately 10,000 steps per day. Patient requests referral to weight management. Continue other healthy lifestyle behaviors and choices. Follow-up prevention exam in 1 year. Follow-up office visit pending blood work and for chronic conditions as needed.      Relevant Orders   Comprehensive metabolic panel (Completed)   CBC (Completed)   Lipid panel (Completed)   Hemoglobin A1c (Completed)       I have discontinued Ms. Cardoza's amoxicillin-clavulanate. I am also having her maintain her Multiple Vitamins-Minerals (MULTIVITAMIN PO), escitalopram, and OLIVE LEAF EXTRACT PO.   Follow-up: Return in about 1 year (around  12/08/2017), or if symptoms worsen or fail to improve.   Mauricio Po, FNP

## 2016-12-08 NOTE — Assessment & Plan Note (Signed)
1) Anticipatory Guidance: Discussed importance of wearing a seatbelt while driving and not texting while driving; changing batteries in smoke detector at least once annually; wearing suntan lotion when outside; eating a balanced and moderate diet; getting physical activity at least 30 minutes per day.  2) Immunizations / Screenings / Labs:  All immunizations are up-to-date per recommendations. Cervical cancer screening is up-to-date. All other screenings are up-to-date per recommendations.Obtain CBC, A1c, CMET, and lipid profile.    Overall well exam with risk factors for cardiovascular disease including obesity. Recommend weight loss of 5-10% of current body weight through nutrition and physical activity. Increase physical activity to 30 minutes of moderate level activity daily or approximately 10,000 steps per day. Patient requests referral to weight management. Continue other healthy lifestyle behaviors and choices. Follow-up prevention exam in 1 year. Follow-up office visit pending blood work and for chronic conditions as needed.

## 2017-01-03 ENCOUNTER — Ambulatory Visit: Payer: BC Managed Care – PPO | Admitting: Certified Nurse Midwife

## 2017-01-10 ENCOUNTER — Ambulatory Visit: Payer: BC Managed Care – PPO | Admitting: Certified Nurse Midwife

## 2017-01-26 ENCOUNTER — Ambulatory Visit (INDEPENDENT_AMBULATORY_CARE_PROVIDER_SITE_OTHER): Payer: BC Managed Care – PPO | Admitting: Certified Nurse Midwife

## 2017-01-26 ENCOUNTER — Other Ambulatory Visit (HOSPITAL_COMMUNITY)
Admission: RE | Admit: 2017-01-26 | Discharge: 2017-01-26 | Disposition: A | Discharge status: Home / Self Care | Payer: BC Managed Care – PPO | Source: Ambulatory Visit | Attending: Obstetrics & Gynecology | Admitting: Obstetrics & Gynecology

## 2017-01-26 ENCOUNTER — Encounter: Payer: Self-pay | Admitting: Certified Nurse Midwife

## 2017-01-26 VITALS — BP 116/62 | HR 70 | Resp 16 | Ht 69.0 in | Wt 235.0 lb

## 2017-01-26 DIAGNOSIS — Z124 Encounter for screening for malignant neoplasm of cervix: Secondary | ICD-10-CM

## 2017-01-26 DIAGNOSIS — Z01419 Encounter for gynecological examination (general) (routine) without abnormal findings: Secondary | ICD-10-CM

## 2017-01-26 NOTE — Patient Instructions (Signed)

## 2017-01-26 NOTE — Progress Notes (Signed)
41 y.o. G0P0000 Single  Caucasian Fe here for annual exam. Periods normal every 26- 30 days. No change in amount. No weight change.   Sees PCP for aex, labs with some elevation of cholesterol and has increased exercise. Not sexually active. Took trip to [place] for school and really enjoyed the history. No other health issues today.  Patient's last menstrual period was 01/10/2017.          Sexually active: No.  The current method of family planning is abstinence.    Exercising: Yes.    cardio & weights Smoker:  no  Health Maintenance: Pap:  12-30-14 ASCUS HPV HR +, 12-31-15 neg History of Abnormal Pap: yes, colpo & cryo MMG:  2012 bilateral & left breast u/s birads 1:neg Self Breast exams: no Colonoscopy:  none BMD:   none TDaP:  2010 Shingles: no Pneumonia: no Hep C and HIV: both neg 2016 Labs: none   reports that she has never smoked. She has never used smokeless tobacco. She reports that she does not drink alcohol or use drugs.  Past Medical History:  Diagnosis Date  . Abnormal Pap smear of cervix 1999, 2016  . Anxiety   . Depression     Past Surgical History:  Procedure Laterality Date  . COLPOSCOPY  2016  . CRYOTHERAPY  1999   for abnormal pap smear  . NO PAST SURGERIES    . TYMPANOSTOMY TUBE PLACEMENT     twice as a child    Current Outpatient Prescriptions  Medication Sig Dispense Refill  . escitalopram (LEXAPRO) 10 MG tablet Take 20 mg by mouth daily.     . Multiple Vitamins-Minerals (MULTIVITAMIN PO) Take by mouth daily.    Marland Kitchen OLIVE LEAF EXTRACT PO Take by mouth.    . Omega-3 Fatty Acids (SALMON OIL PO) Take by mouth.     No current facility-administered medications for this visit.     Family History  Problem Relation Age of Onset  . Hyperlipidemia Mother   . Alcohol abuse Mother   . Melanoma Father   . Cancer Paternal Aunt 51       melanoma  . Heart disease Maternal Grandfather   . Breast cancer Maternal Aunt 63       mastectomy    ROS:   Pertinent items are noted in HPI.  Otherwise, a comprehensive ROS was negative.  Exam:   BP 116/62   Pulse 70   Resp 16   Ht 5\' 9"  (1.753 m)   Wt 235 lb (106.6 kg)   LMP 01/10/2017   BMI 34.70 kg/m  Height: 5\' 9"  (175.3 cm) Ht Readings from Last 3 Encounters:  01/26/17 5\' 9"  (1.753 m)  12/08/16 5\' 9"  (1.753 m)  10/26/16 5\' 9"  (1.753 m)    General appearance: alert, cooperative and appears stated age Head: Normocephalic, without obvious abnormality, atraumatic Neck: no adenopathy, supple, symmetrical, trachea midline and thyroid normal to inspection and palpation Lungs: clear to auscultation bilaterally Breasts: normal appearance, no masses or tenderness, No nipple retraction or dimpling, No nipple discharge or bleeding, No axillary or supraclavicular adenopathy Heart: regular rate and rhythm Abdomen: soft, non-tender; no masses,  no organomegaly Extremities: extremities normal, atraumatic, no cyanosis or edema Skin: Skin color, texture, turgor normal. No rashes or lesions Lymph nodes: Cervical, supraclavicular, and axillary nodes normal. No abnormal inguinal nodes palpated Neurologic: Grossly normal   Pelvic: External genitalia:  no lesions  Urethra:  normal appearing urethra with no masses, tenderness or lesions              Bartholin's and Skene's: normal                 Vagina: normal appearing vagina with normal color and discharge, no lesions              Cervix: no cervical motion tenderness, no lesions and normal appearance              Pap taken: Yes.   Bimanual Exam:  Uterus:  normal size, contour, position, consistency, mobility, non-tender              Adnexa: normal adnexa and no mass, fullness, tenderness               Rectovaginal: Confirms               Anus:  normal sphincter tone, no lesions  Chaperone present: yes  A:  Well Woman with normal exam  Contraception abstinence  History of ASCUS + HPVHR, colpo LSIL  Mammogram due  P:   Reviewed  health and wellness pertinent to exam   Will advise if needs contraception  Discussed routine screening mammogram at 41, given information to schedule screening. Questions addressed.  Pap smear: yes   counseled on breast self exam, mammography screening, STD prevention, HIV risk factors and prevention, adequate intake of calcium and vitamin D, diet and exercise  return annually or prn  An After Visit Summary was printed and given to the patient.

## 2017-01-27 LAB — CYTOLOGY - PAP
ADEQUACY: ABSENT
Diagnosis: NEGATIVE

## 2017-02-13 ENCOUNTER — Other Ambulatory Visit: Payer: Self-pay | Admitting: Certified Nurse Midwife

## 2017-02-13 DIAGNOSIS — Z1239 Encounter for other screening for malignant neoplasm of breast: Secondary | ICD-10-CM

## 2017-02-24 ENCOUNTER — Ambulatory Visit
Admission: RE | Admit: 2017-02-24 | Discharge: 2017-02-24 | Disposition: A | Discharge status: Home / Self Care | Payer: BC Managed Care – PPO | Source: Ambulatory Visit | Attending: Certified Nurse Midwife | Admitting: Certified Nurse Midwife

## 2017-02-24 DIAGNOSIS — Z1239 Encounter for other screening for malignant neoplasm of breast: Secondary | ICD-10-CM

## 2017-02-28 ENCOUNTER — Other Ambulatory Visit: Payer: Self-pay | Admitting: Certified Nurse Midwife

## 2017-02-28 DIAGNOSIS — R928 Other abnormal and inconclusive findings on diagnostic imaging of breast: Secondary | ICD-10-CM

## 2017-03-03 ENCOUNTER — Ambulatory Visit
Admission: RE | Admit: 2017-03-03 | Discharge: 2017-03-03 | Disposition: A | Discharge status: Home / Self Care | Payer: BC Managed Care – PPO | Source: Ambulatory Visit | Attending: Certified Nurse Midwife | Admitting: Certified Nurse Midwife

## 2017-03-03 ENCOUNTER — Other Ambulatory Visit: Payer: Self-pay | Admitting: Certified Nurse Midwife

## 2017-03-03 DIAGNOSIS — R928 Other abnormal and inconclusive findings on diagnostic imaging of breast: Secondary | ICD-10-CM

## 2017-03-03 DIAGNOSIS — R599 Enlarged lymph nodes, unspecified: Secondary | ICD-10-CM

## 2017-06-06 ENCOUNTER — Other Ambulatory Visit: Payer: BC Managed Care – PPO

## 2017-07-24 ENCOUNTER — Telehealth: Payer: Self-pay | Admitting: *Deleted

## 2017-07-24 NOTE — Telephone Encounter (Signed)
Patient is on 04 recall for 05/2017. Please contact patient regarding scheduling F/U breast imaging -eh

## 2017-07-26 NOTE — Telephone Encounter (Signed)
Pt states she had it done at [facility] & will have them fax it.

## 2017-07-28 NOTE — Telephone Encounter (Signed)
Have you seen mmg report for her yet? Routing to Johny Shock, CNM

## 2017-07-30 NOTE — Telephone Encounter (Signed)
No mammogram report in my folder

## 2017-08-02 NOTE — Telephone Encounter (Signed)
Please check care everywhere. I made a request & mammo results are there.

## 2017-11-21 ENCOUNTER — Telehealth: Payer: Self-pay | Admitting: Certified Nurse Midwife

## 2017-11-21 NOTE — Telephone Encounter (Signed)
Left message to call Sharee Pimple at [phone].   Reviewed with Dr. Sabra Heck. Can receive up age 41 yo. Need to check with insurance provider prior to receiving to confirm coverage.

## 2017-11-21 NOTE — Telephone Encounter (Signed)
I thought age range was up to 40 only. Is this correct?

## 2017-11-21 NOTE — Telephone Encounter (Signed)
Patient wanting to know if we give the gardisil 9 injection.

## 2017-11-21 NOTE — Telephone Encounter (Signed)
Spoke with patient. 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 Patient request to start prior to AEX.   Last AEX 01/26/17 LMP 11/20/17. Mo contraceptive. Advised patient Gardasil would be given during days 1-7 of cycle  Advised will review with [name], CNM and return call with recommendations. Patient agreeable.   [name], CNM -please advise, ok to proceed with Gardasil?

## 2017-11-22 ENCOUNTER — Ambulatory Visit (INDEPENDENT_AMBULATORY_CARE_PROVIDER_SITE_OTHER): Payer: BC Managed Care – PPO

## 2017-11-22 VITALS — BP 110/68 | HR 62 | Resp 14 | Ht 69.0 in | Wt 227.0 lb

## 2017-11-22 DIAGNOSIS — Z23 Encounter for immunization: Secondary | ICD-10-CM

## 2017-11-22 NOTE — Progress Notes (Signed)
Patient in today for first Gardasil injection.   Contraception: abstinence   LMP: 11/20/17 Last AEX: 9/6/18with Evalee Mutton  Injection given in right deltoid. Patient tolerated shot well.   Patient informed next injection due in about 2 months.  Advised patient, if not on birth control, to return for next injection with cycle.   Routed to provider for final review.  Encounter closed.

## 2017-11-22 NOTE — Telephone Encounter (Signed)
Patient returned call to nurse Jill. °

## 2017-11-22 NOTE — Telephone Encounter (Signed)
Call to patient. Message given to patient as seen below. 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 States she started her cycle on 11-20-17 and typically is on cycle for 5-6 days. RN advised would need to review nurse schedule for 7-3 and 7-5 with nursing supervisor and return call to patient. Patient agreeable.

## 2017-11-22 NOTE — Telephone Encounter (Signed)
Returned call to patient. After review with Gay Filler, nursing supervisor, okay to schedule nurse visit today. Patient scheduled for 11-22-17 at 1000. Patient agreeable to date and time of appointment. Patient is currently on cycle.   Routing to provider for final review. Patient agreeable to disposition. Will close encounter.

## 2017-12-25 ENCOUNTER — Encounter: Payer: Self-pay | Admitting: Family

## 2017-12-25 ENCOUNTER — Ambulatory Visit: Payer: BC Managed Care – PPO | Admitting: Family

## 2017-12-25 VITALS — BP 128/82 | HR 74 | Temp 99.4°F | Ht 69.0 in | Wt 229.0 lb

## 2017-12-25 DIAGNOSIS — H6591 Unspecified nonsuppurative otitis media, right ear: Secondary | ICD-10-CM

## 2017-12-25 DIAGNOSIS — J019 Acute sinusitis, unspecified: Secondary | ICD-10-CM | POA: Diagnosis not present

## 2017-12-25 MED ORDER — FLUTICASONE PROPIONATE 50 MCG/ACT NA SUSP: 2.0000 | Freq: Every day | NASAL | 6 refills | Status: DC

## 2017-12-25 MED ORDER — CEFDINIR 300 MG PO CAPS: 300.0000 mg | ORAL_CAPSULE | Freq: Two times a day (BID) | ORAL | 0 refills | Status: DC

## 2017-12-25 NOTE — Progress Notes (Signed)
Joyce Gray is a 42 y.o. female with the following history as recorded in EpicCare:  Patient Active Problem List   Diagnosis Date Noted  . Acute upper respiratory infection 10/26/2016  . Routine general medical examination at a health care facility 12/02/2014  . Obese   . Contraception management 03/05/2014  . Depression     Current Outpatient Medications  Medication Sig Dispense Refill  . Glucosamine 750 MG TABS Take by mouth.    . TURMERIC PO Take by mouth.    . cefdinir (OMNICEF) 300 MG capsule Take 1 capsule (300 mg total) by mouth 2 (two) times daily. 20 capsule 0  . fluticasone (FLONASE) 50 MCG/ACT nasal spray Place 2 sprays into both nostrils daily. 16 g 6  . Multiple Vitamins-Minerals (MULTIVITAMIN PO) Take by mouth daily.    . Omega-3 Fatty Acids (SALMON OIL PO) Take by mouth.     No current facility-administered medications for this visit.     Allergies: Patient has no known allergies.  Past Medical History:  Diagnosis Date  . Abnormal Pap smear of cervix 1999, 2016  . Anxiety   . Depression     Past Surgical History:  Procedure Laterality Date  . COLPOSCOPY  2016  . CRYOTHERAPY  1999   for abnormal pap smear  . NO PAST SURGERIES    . TYMPANOSTOMY TUBE PLACEMENT     twice as a child    Family History  Problem Relation Age of Onset  . Hyperlipidemia Mother   . Alcohol abuse Mother   . Melanoma Father   . Cancer Paternal Aunt 31       melanoma  . Heart disease Maternal Grandfather   . Breast cancer Maternal Aunt 63       mastectomy    Social History   Tobacco Use  . Smoking status: Never Smoker  . Smokeless tobacco: Never Used  Substance Use Topics  . 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  Head: Normocephalic and atraumatic  Eyes: Sclera and conjunctiva clear; pupils round and reactive to light; extraocular movements intact  Ears: External normal; canals clear; right TM erythematous Oropharynx: Pink, supple. No suspicious lesions  Neck: Supple without thyromegaly, adenopathy  Lungs: Respirations unlabored; clear to auscultation bilaterally without wheeze, rales, rhonchi  Neurologic: Alert and oriented; speech intact; face symmetrical; moves all extremities well; CNII-XII intact without focal deficit   Assessment:  1. Acute sinusitis, recurrence not specified, unspecified location   2. OME (otitis media with effusion), right     Plan:  Rx for Omnicef 300 mg bid x 10 days, Flonase NS; increase fluids, rest and follow-up worse, no better.  Continue to see GYN yearly for CPE/ preventive healthcare needs.   No follow-ups on file.  No orders  of the defined types were placed in this encounter.   Requested Prescriptions   Signed Prescriptions Disp Refills  . fluticasone (FLONASE) 50 MCG/ACT nasal spray 16 g 6    Sig: Place 2 sprays into both nostrils daily.  . cefdinir (OMNICEF) 300 MG capsule 20 capsule 0    Sig: Take 1 capsule (300 mg total) by mouth 2 (two) times daily.

## 2018-01-02 ENCOUNTER — Ambulatory Visit: Payer: BC Managed Care – PPO | Admitting: Family

## 2018-01-23 ENCOUNTER — Ambulatory Visit (INDEPENDENT_AMBULATORY_CARE_PROVIDER_SITE_OTHER): Payer: BC Managed Care – PPO

## 2018-01-23 VITALS — BP 115/64 | HR 68 | Resp 14 | Ht 69.0 in | Wt 226.0 lb

## 2018-01-23 DIAGNOSIS — Z23 Encounter for immunization: Secondary | ICD-10-CM | POA: Diagnosis not present

## 2018-01-23 NOTE — Progress Notes (Signed)
Patient in today for 2nd Gardasil injection.   Contraception: none  LMP: 01/09/18 Last AEX: 01/26/17 with Regina Eck, CNM  Injection given in Left deltoid. Patient tolerated shot well.   Patient informed next injection due in about 4 months.  Advised patient, if not on birth control, to return for next injection with cycle.   Routed to provider for final review.  Encounter closed.

## 2018-02-02 ENCOUNTER — Other Ambulatory Visit: Payer: Self-pay

## 2018-02-02 ENCOUNTER — Encounter: Payer: Self-pay | Admitting: Certified Nurse Midwife

## 2018-02-02 ENCOUNTER — Ambulatory Visit: Payer: BC Managed Care – PPO | Admitting: Certified Nurse Midwife

## 2018-02-02 VITALS — BP 112/66 | HR 70 | Resp 16 | Ht 68.75 in | Wt 227.0 lb

## 2018-02-02 DIAGNOSIS — Z01419 Encounter for gynecological examination (general) (routine) without abnormal findings: Secondary | ICD-10-CM | POA: Diagnosis not present

## 2018-02-02 NOTE — Patient Instructions (Signed)

## 2018-02-02 NOTE — Progress Notes (Signed)
42 y.o. G0P0000 Single  Caucasian Fe here for annual exam. Periods normal, no issues. Sees [facility] if needed for PCP. 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 Will continue with therapy. Considering sperm donor pregnancy, aware she should not wait too long.Had last mammogram at [facility] with benign ridge in left breast. Continues with breast exam monthly and follow up as needed. Has been working on weight loss. No other health issues today.  Patient's last menstrual period was 01/09/2018 (exact date).          Sexually active: No.  The current method of family planning is abstinence.    Exercising: Yes.    strength & aerobic Smoker:  no  Review of Systems  Constitutional: Negative.   HENT: Negative.   Eyes: Negative.   Respiratory: Negative.   Cardiovascular: Positive for palpitations.  Gastrointestinal: Negative.   Genitourinary: Negative.   Musculoskeletal:       Muscle/joint pain  Skin: Negative.   Neurological: Negative.   Endo/Heme/Allergies: Negative.   Psychiatric/Behavioral: Negative.     Health Maintenance: Pap:  12-30-14 ASCUS HPV HR+, 12-31-15 neg, 01-26-17 neg History of Abnormal Pap: cryo MMG:  10/18 bilateral & left breast u/s birads 3: prob benign f/u 68mths 12/18 (in care everywhere) Self Breast exams: no Colonoscopy:  none BMD:   none TDaP:  2010 Shingles: no Pneumonia: no Hep C and HIV: both neg 2016 Labs: if needed   reports that she has never smoked. She has never used smokeless tobacco. She reports that she drinks about 2.0 standard drinks of alcohol per week. She reports that she does not use drugs.  Past Medical History:  Diagnosis Date  . Abnormal Pap smear of cervix 1999, 2016  . Anxiety   . Depression   . Incomplete right bundle branch block     Past Surgical History:  Procedure Laterality Date  . COLPOSCOPY  2016  . CRYOTHERAPY  1999   for abnormal pap smear  . NO PAST SURGERIES    . TYMPANOSTOMY TUBE  PLACEMENT     twice as a child    Current Outpatient Medications  Medication Sig Dispense Refill  . escitalopram (LEXAPRO) 5 MG tablet Take 5 mg by mouth daily.    . Glucosamine 750 MG TABS Take by mouth.    . Multiple Vitamins-Minerals (MULTIVITAMIN PO) Take by mouth daily.    . TURMERIC PO Take by mouth.     No current facility-administered medications for this visit.     Family History  Problem Relation Age of Onset  . Hyperlipidemia Mother   . Alcohol abuse Mother   . Melanoma Father   . Cancer Paternal Aunt 3       melanoma  . Heart disease Maternal Grandfather   . Breast cancer Maternal Aunt 63       mastectomy    ROS:  Pertinent items are noted in HPI.  Otherwise, a comprehensive ROS was negative.  Exam:   BP 112/66   Pulse 70   Resp 16   Ht 5' 8.75" (1.746 m)   Wt 227 lb (103 kg)   LMP 01/09/2018 (Exact Date)   BMI 33.77 kg/m  Height: 5' 8.75" (174.6 cm) Ht Readings from Last 3 Encounters:  02/02/18 5' 8.75" (1.746 m)  01/23/18 5\' 9"  (1.753 m)  12/25/17 5\' 9"  (1.753 m)    General appearance: alert, cooperative and appears stated age Head: Normocephalic, without obvious abnormality, atraumatic Neck: no  adenopathy, supple, symmetrical, trachea midline and thyroid normal to inspection and palpation Lungs: clear to auscultation bilaterally Breasts: normal appearance, no masses or tenderness, No nipple retraction or dimpling, No nipple discharge or bleeding, No axillary or supraclavicular adenopathy Heart: regular rate and rhythm Abdomen: soft, non-tender; no masses,  no organomegaly Extremities: extremities normal, atraumatic, no cyanosis or edema Skin: Skin color, texture, turgor normal. No rashes or lesions Lymph nodes: Cervical, supraclavicular, and axillary nodes normal. No abnormal inguinal nodes palpated Neurologic: Grossly normal   Pelvic: External genitalia:  no lesions              Urethra:  normal appearing urethra with no masses, tenderness or  lesions              Bartholin's and Skene's: normal                 Vagina: normal appearing vagina with normal color and discharge, no lesions              Cervix: no lesions, nulliparous appearance and normal appearance              Pap taken: No. Bimanual Exam:  Uterus:  normal size, contour, position, consistency, mobility, non-tender and anteverted              Adnexa: normal adnexa and no mass, fullness, tenderness               Rectovaginal: Confirms               Anus:  normal sphincter tone, no lesions  Chaperone present: yes  A:  Well Woman with normal exam  Contraception none needed  Considering sperm donation for pregnancy would be AMA  Hip pain under MD management and treatment  P:   Reviewed health and wellness pertinent to exam  Discussed seeking consult with [facility] regarding sperm donation and IUI and options available to her. Discussed due AMA would recommend seeking information soon for better conception options. Patient will advise if information needed.  Continue follow up with MD as indicated.  Pap smear: no  counseled on breast self exam, mammography screening, adequate intake of calcium and vitamin D, diet and exercise  return annually or prn  An After Visit Summary was printed and given to the patient.

## 2018-03-22 ENCOUNTER — Telehealth: Payer: Self-pay | Admitting: Certified Nurse Midwife

## 2018-03-22 NOTE — Telephone Encounter (Signed)
Spoke with patient. Patient reports menses for September and October have been irregular. LMP 10/17, still spotting. Patient states it is not uncommon for her cycles to be early, but she is now bleeding longer. Reports "menses like cramps" with bleeding, denies any other symptoms. Is not currently SA, abstinence for contraceptive.   Received steroid injection on 9/12 for SI joint, unsure if related.   Recommended OV for further evaluation, patient request to schedule with Dr. Sabra Heck. OV scheduled for 11/5 at 10:15am.   Advised Dr. Sabra Heck will review, our office will return call if any additional recommendations. Patient verbalizes understanding and is agreeable.   Last AEX 02/02/18  Routing to provider for final review. Patient is agreeable to disposition. Will close encounter.  Cc: [name], CNM

## 2018-03-22 NOTE — Telephone Encounter (Signed)
Patient had steroid injection into hip on September 12. Her cycle came a week late after injection. Once it ended, she continued to spot every day and is still spotting.

## 2018-03-25 ENCOUNTER — Encounter (HOSPITAL_COMMUNITY): Payer: Self-pay

## 2018-03-25 ENCOUNTER — Inpatient Hospital Stay (HOSPITAL_COMMUNITY)
Admission: AD | Admit: 2018-03-25 | Discharge: 2018-03-25 | Discharge status: Against Medical Advice | Payer: BC Managed Care – PPO | Source: Ambulatory Visit | Attending: Obstetrics & Gynecology | Admitting: Obstetrics & Gynecology

## 2018-03-25 DIAGNOSIS — Z5329 Procedure and treatment not carried out because of patient's decision for other reasons: Secondary | ICD-10-CM | POA: Diagnosis not present

## 2018-03-25 DIAGNOSIS — N939 Abnormal uterine and vaginal bleeding, unspecified: Secondary | ICD-10-CM | POA: Diagnosis present

## 2018-03-25 LAB — URINALYSIS, ROUTINE W REFLEX MICROSCOPIC
Bacteria, UA: NONE SEEN
Bilirubin Urine: NEGATIVE
GLUCOSE, UA: NEGATIVE mg/dL
KETONES UR: NEGATIVE mg/dL
Leukocytes, UA: NEGATIVE
Nitrite: NEGATIVE
PH: 6 (ref 5.0–8.0)
Protein, ur: NEGATIVE mg/dL
Specific Gravity, Urine: 1.009 (ref 1.005–1.030)

## 2018-03-25 LAB — POCT PREGNANCY, URINE: Preg Test, Ur: NEGATIVE

## 2018-03-25 NOTE — MAU Note (Signed)
Vaginal bleeding   09/12 steroid injection, period came a week late after the injection. Had some spotting after period. Had another menstrual cycle and has continued to have spotting. States sometimes bleeding has been getting heavier. Changing a pad every 2 hours  Some abdominal cramping

## 2018-03-25 NOTE — MAU Note (Signed)
Pt requesting AMA papers to leave. RN witnesses signature on [place] paperwork.

## 2018-03-26 ENCOUNTER — Ambulatory Visit: Payer: Self-pay | Admitting: Obstetrics & Gynecology

## 2018-03-26 ENCOUNTER — Telehealth: Payer: Self-pay | Admitting: Obstetrics & Gynecology

## 2018-03-26 NOTE — Telephone Encounter (Signed)
Patient is asking to talk with a nurse about her appointment tomorrow.

## 2018-03-26 NOTE — Telephone Encounter (Signed)
Patient returned call, spoke with [name] for today confirmed. OV for 11/5 cancelled.    Routing to provider for final review. Patient is agreeable to disposition. Will close encounter.   Cc: [name], CNM

## 2018-03-26 NOTE — Telephone Encounter (Signed)
Call reviewed with nursing supervisor, returned call to patient. Left detailed message advising OV scheduled for today at 3:30pm with Dr. Sabra Heck. Please return call to office to confirm.

## 2018-03-26 NOTE — Telephone Encounter (Signed)
Spoke with patient.   10/31 -patient called for irregular menses, OV scheduled for 11/5 with Dr. Sabra Heck. See telephone encounter.   Flow became heavier over the weekend, heavier than menses, changing saturated pad q2hrs. Patient became concerned and "overwhelmed" on Saturday, started driving to hospital, pulled over and called EMS. EMS arrived, patient declined transport to ER. Decided to continue to monitor.   Patient states bleeding continued, changing saturated pad q2hrs, went to [facility] on 11/3, checked in and left AMA after 3 hrs. UA completed and vital signs.   Patient reports flow is lighter today, feels weaker than her normal. Denies pain, SOB, lightheadedness or dizziness. Patient calling to provide update. Advised will review schedule with nursing supervisor and return call, patient is agreeable to OV today.

## 2018-03-27 ENCOUNTER — Ambulatory Visit: Payer: Self-pay | Admitting: Obstetrics & Gynecology

## 2018-03-27 ENCOUNTER — Other Ambulatory Visit: Payer: Self-pay | Admitting: Obstetrics & Gynecology

## 2018-03-27 ENCOUNTER — Ambulatory Visit (INDEPENDENT_AMBULATORY_CARE_PROVIDER_SITE_OTHER): Payer: BC Managed Care – PPO | Admitting: Obstetrics & Gynecology

## 2018-03-27 ENCOUNTER — Ambulatory Visit (INDEPENDENT_AMBULATORY_CARE_PROVIDER_SITE_OTHER): Payer: BC Managed Care – PPO

## 2018-03-27 VITALS — BP 126/80 | HR 68 | Resp 16 | Ht 68.75 in | Wt 221.2 lb

## 2018-03-27 DIAGNOSIS — N938 Other specified abnormal uterine and vaginal bleeding: Secondary | ICD-10-CM

## 2018-03-27 DIAGNOSIS — N83202 Unspecified ovarian cyst, left side: Secondary | ICD-10-CM

## 2018-03-27 LAB — HEMOGLOBIN: Hemoglobin: 13.6

## 2018-03-27 MED ORDER — NORETHIN ACE-ETH ESTRAD-FE 1-20 MG-MCG PO TABS: 1.0000 | ORAL_TABLET | Freq: Every day | ORAL | 3 refills | Status: DC

## 2018-03-27 NOTE — Progress Notes (Signed)
Patient scheduled while in office for PUS on 07/05/18 at 3:30pm with consult to follow at 4pm with Dr. Sabra Heck. Patient verbalizes understanding and is agreeable.

## 2018-03-27 NOTE — Progress Notes (Signed)
42 y.o. G0P0000 Single White or Caucasian female here for pelvic ultrasound due to irregular bleeding that seems to have started after having a steroid injection with ortho.  Reports she's had irregular bleeding since cycle in September.  Cycle was shorter and lighter in early October.  She continued to spot after her cycle occurred.  Reports on day 18, which was Friday Nov 1st, bleeding started back like her cycle was going to start back again.  On Saturday, her bleeding increased and was much heavier than normal.  She called EMS but decided to not go to [facility] ER.  Then decided on November 2nd to go to [facility].  After 4 hours, she decided to leave AMA.  She called the office yesterday. Bleeding has improved.  PUS was ordered.  Here for this now.  Patient's last menstrual period was 03/22/2018.  Contraception: abstinence   Findings:  UTERUS: 9.7 x 7.5 x 6.1cm with multiple fibroids, 1.5cm, 3.1cm, 3.4cm, 1.9cm, 1.7cm, 1.7cm, 2.3cm EMS: 3.26mm ADNEXA: Left ovary: 6.4 x 4.5 x 3.8cm with 5.8 x 4.0 x 4.1cm simple appearing cyst       Right ovary: 3.0 x 2.0 x 1.5cm CUL DE SAC: no free fluid  Discussion:  Findings reviewed with pt.  Fibroids and ovarian cyst noted.  Pt not really interested in any treatment for fibroids at this time.  Need to reassess ovary in the future reviewed.  Repeat PUS discussed.  Also, d/w pt treatment with OCPs to help resolve cyst, if it is functional.  Pt comfortable with this.  Pain precautions and torsion risks discussed.  Risks discussed with pt in detail including DUB, DVT/PE, headache, nausea, increased BP.    Hb today:  13.6  Assessment:  Irregular bleeding  5.8cm left ovarian cyst Fibroid uterus  Plan:  Will start loestrin 1/20 daily.  #3 month RF/3RF Repeat PUS 3 months  ~30 minutes spent with patient >50% of time was in face to face discussion of above.

## 2018-03-28 ENCOUNTER — Telehealth: Payer: Self-pay | Admitting: Obstetrics & Gynecology

## 2018-03-28 NOTE — Telephone Encounter (Signed)
Spoke with patient, patient reports bleeding has stopped. Started OCP 03/27/18.   Patient aware to call with any concerns, will see Dr. Sabra Heck 07/05/17 for PUS. Patient thankful for assistance.  Routing to provider for final review. Patient is agreeable to disposition. Will close encounter.

## 2018-03-28 NOTE — Telephone Encounter (Signed)
Spoke with patient. Patient states she has a left ovarian cyst seen on PUS, asking of there are any activity restrictions?  Advised not aware of any restrictions. Advised to return call to office if any increased pain, N/V, fever/chills, heavy bleeidng. Advised Dr. Sabra Heck will review, I will return call if any additional recommendations.  Routing to provider for final review. Patient is agreeable to disposition. Will close encounter.

## 2018-03-28 NOTE — Telephone Encounter (Signed)
Patient is calling to give Sharee Pimple an update. After the birth control, the bleeding has completely stopped. Patient stated that she is "very happy and thankful."

## 2018-03-28 NOTE — Telephone Encounter (Signed)
Patient has a question for Dr.Miller's nurse, no details given. 

## 2018-03-30 ENCOUNTER — Encounter: Payer: Self-pay | Admitting: Obstetrics & Gynecology

## 2018-04-06 ENCOUNTER — Telehealth: Payer: Self-pay | Admitting: Obstetrics & Gynecology

## 2018-04-06 MED ORDER — NORETHIN-ETH ESTRAD-FE BIPHAS 1 MG-10 MCG / 10 MCG PO TABS: 1.0000 | ORAL_TABLET | Freq: Every day | ORAL | 3 refills | Status: DC

## 2018-04-06 NOTE — Telephone Encounter (Signed)
Patient states that she does want to try the loloestrin birth control pill. Can be sent to [facility] at [address].

## 2018-04-06 NOTE — Telephone Encounter (Signed)
Call returned to patient. No answer, attempted to leave voicemail, unable to leave message.

## 2018-04-06 NOTE — Telephone Encounter (Signed)
Left detailed message, ok per dpr. Advised as seen below per Dr. Sabra Heck. Advised patient to return call to office if she would like to try LoLoestrin or with any additional questions.

## 2018-04-06 NOTE — Telephone Encounter (Signed)
Spoke with patient. Patient has been on OCP for 1.5 wks. Bleeding has stopped. Reports headache, nausea and sleepiness since staring OCP. Takes OCP in morning. Reviewed common side effects of OCP with patient. Patient is going to try taking at night.   Patient states she would only like to take OCP for month and see how her bleeding does after stopping medication, would like to know Dr. Ammie Ferrier thoughts on this.   Advised Dr. Sabra Heck will review, our office will return call with recommendations.   Routing to Dr. Sabra Heck

## 2018-04-06 NOTE — Telephone Encounter (Signed)
Left detailed message advising LoLoestrin #[id number]/[id number] sent to [facility]. Return call to office of any additional questions.   Encounter closed.

## 2018-04-06 NOTE — Telephone Encounter (Signed)
Patient is returning a call to Jill. °

## 2018-04-06 NOTE — Telephone Encounter (Signed)
She could switch to Loloestrin at this time.  Ok to just start the new pack due to side effects.  If this helps symptoms, I would recommend staying on for longer than a month if she really wants to have some improved cycle regulation.  She might have spotting with the lower dosed OCP but I think ok to change now due to increased headaches and nausea.

## 2018-04-06 NOTE — Telephone Encounter (Signed)
Patient called and said she'd like Dr. Sabra Heck to know she thinks her birth control she is taking to help control bleeding may be giving her headaches, nausea, and sleepiness. She'd like to know if she can try it for just one month and then see how she does without it.

## 2018-05-17 ENCOUNTER — Telehealth: Payer: Self-pay | Admitting: Obstetrics & Gynecology

## 2018-05-17 NOTE — Telephone Encounter (Signed)
Patient is on Lo loestrin fe birth control for heavy period. Cycle started during last week of active pills. Would like to know if she should she switch back to Junel Fe for higher estrogen? Patient is concerned about switching back to high estrogen due to having HPV.

## 2018-05-17 NOTE — Telephone Encounter (Signed)
Spoke with patient.  Advised needs to continue on Loloestrin.  She has only taken one month and did not realize she needed to take the last two white pills in the pack.   Education provided to take all pills in pack, 1 pill each day, even if develops bleeding and then start onto new pack. Some breakthrough bleeding normal. Call back if continues. Has not had any missed pills, reports maybe a late pill sometimes, encouraged to set alarm for pill reminder.   Advised to call back with any further concerns and that I would call her back with any instructions from Dr. Sabra Heck, if any.  She is agreeable to plan. Will take pills as instructed and call back with any concerns.   Routing to Dr. Sabra Heck, okay to close

## 2018-05-23 DIAGNOSIS — A6920 Lyme disease, unspecified: Secondary | ICD-10-CM

## 2018-05-23 HISTORY — DX: Lyme disease, unspecified: A69.20

## 2018-05-24 ENCOUNTER — Ambulatory Visit (INDEPENDENT_AMBULATORY_CARE_PROVIDER_SITE_OTHER): Payer: BC Managed Care – PPO

## 2018-05-24 VITALS — BP 114/70 | HR 64 | Resp 16 | Ht 68.75 in | Wt 220.0 lb

## 2018-05-24 DIAGNOSIS — Z23 Encounter for immunization: Secondary | ICD-10-CM | POA: Diagnosis not present

## 2018-05-24 NOTE — Progress Notes (Signed)
Patient in today for 3rd Gardasil injection.   Contraception: LO LOESTRIN LMP: 05/13/18 Last AEX: 02/02/18 with DL  Injection given in Right Deltoid. Patient tolerated shot well.   Patient has completed the HPV vaccination series.  Routed to provider for final review.  Encounter closed.

## 2018-07-03 ENCOUNTER — Other Ambulatory Visit: Payer: Self-pay | Admitting: *Deleted

## 2018-07-03 DIAGNOSIS — N83202 Unspecified ovarian cyst, left side: Secondary | ICD-10-CM

## 2018-07-05 ENCOUNTER — Ambulatory Visit: Payer: BC Managed Care – PPO | Admitting: Obstetrics & Gynecology

## 2018-07-05 ENCOUNTER — Ambulatory Visit (INDEPENDENT_AMBULATORY_CARE_PROVIDER_SITE_OTHER): Payer: BC Managed Care – PPO

## 2018-07-05 VITALS — BP 104/80 | HR 60 | Resp 16 | Ht 68.75 in | Wt 219.4 lb

## 2018-07-05 DIAGNOSIS — N83202 Unspecified ovarian cyst, left side: Secondary | ICD-10-CM | POA: Diagnosis not present

## 2018-07-05 DIAGNOSIS — D251 Intramural leiomyoma of uterus: Secondary | ICD-10-CM

## 2018-07-05 NOTE — Progress Notes (Signed)
43 y.o. G0P0000 Single White or Caucasian female here for pelvic ultrasound due to recheck left ovary as she had a 6cm ovarian cyst noted 03/22/18 on ultrasound.  She was on OCPs x 3 months after ovarian cyst was noted.  She stopped this on 06/16/2018.  Cycles were better the first and second month on the OCP but started two weeks early on the third pack.  Has several questions which were addressed.   Patient's last menstrual period was 06/16/2018 (exact date).  Contraception: Not SA  Findings:  UTERUS: 8.6 x 7.3 x 6.8 cm with several fibroids.  No change noted from prior ultrasound. EMS: 7.2 mm ADNEXA: Left ovary: 2.3 x 2.2 x 2.0 cm with resolution of prior cyst but with a new collapsed corpus luteum that measures 1.5 cm       Right ovary: 2.6 x 2.1 x 1.3 cm  CUL DE SAC: No free fluid  Discussion: Findings reviewed and pictures shown to patient.  These were compared to a prior ultrasound that was performed in November.  She is interested to see will happen with her cycles now that she is off the pill.  We did review options if her cycles are heavy or irregular.  She is aware of having uterine fibroids and the increased risk of bleeding abnormalities associated with this.  If she needs to go back on the pill, she is more likely to desire progesterone only with the next trial versus an estrogen containing pill as she did feel she had some side effects.  Assessment: Resolution of 5.8 cm left ovarian cyst Uterine fibroids  Plan: Patient is going to monitor her symptoms.  She does call she has any heavy or particularly unusual bleeding.  Precautions given for specific bleeding abnormalities.  She does have a follow-up routine exam scheduled with French Ana and she will plan to keep that appointment.  Of course if things change and she needs to come in sooner, we will adjust her appointment as needed.  ~20 minutes spent with patient >50% of time was in face to face discussion of  above.

## 2018-07-11 ENCOUNTER — Encounter: Payer: Self-pay | Admitting: Obstetrics & Gynecology

## 2018-07-11 DIAGNOSIS — M47818 Spondylosis without myelopathy or radiculopathy, sacral and sacrococcygeal region: Secondary | ICD-10-CM | POA: Insufficient documentation

## 2018-07-11 DIAGNOSIS — F419 Anxiety disorder, unspecified: Secondary | ICD-10-CM | POA: Insufficient documentation

## 2018-07-11 DIAGNOSIS — B977 Papillomavirus as the cause of diseases classified elsewhere: Secondary | ICD-10-CM | POA: Insufficient documentation

## 2018-08-13 ENCOUNTER — Ambulatory Visit: Payer: BC Managed Care – PPO | Admitting: Family Medicine

## 2018-09-28 ENCOUNTER — Telehealth: Payer: Self-pay | Admitting: Family

## 2018-09-28 NOTE — Telephone Encounter (Signed)
Okay with me 

## 2018-09-28 NOTE — Telephone Encounter (Signed)
Pt would like to transfer care to Dr. Juleen China. Need ok from both providers. Please advise.

## 2018-09-28 NOTE — Telephone Encounter (Signed)
Okay with me also

## 2018-10-02 NOTE — Progress Notes (Signed)
Virtual Visit via Video   Due to the COVID-19 pandemic, this visit was completed with telemedicine (audio/video) technology to reduce patient and provider exposure as well as to preserve personal protective equipment.   I connected with Joyce Gray by a video enabled telemedicine application and verified that I am speaking with the correct person using two identifiers. Location patient: Home Location provider: [facility], Office Persons participating in the virtual visit: Joyce Gray, Joyce Deutscher, DO Joyce Gray, CMA acting as scribe for Dr. Briscoe Gray.   I discussed the limitations of evaluation and management by telemedicine and the availability of in person appointments. The patient expressed understanding and agreed to proceed.  Care Team   Patient Care Team: Joyce Deutscher, DO as PCP - General (Family Medicine)  Subjective:   HPI: Patient presents for new patient appointment and to discuss SI pain, weight gain, depression, anxiety, insomnia. Teacher at local high school. Dating. Likes to run and do combat fitness.  Hip Pain Ongoing SI joint dysfunction. Previously followed at [facility]. Pain continues, right side. Worse with running or sitting for long periods of time. Has tried injections, PT x 3 months, chiropractor, Mobic. PT and injections most helpful. Chiropractor adjustment helped short term. Mobic caused leg swelling per patient.  Depression/Anxiety  Depression screen [facility] 2/9 10/03/2018 12/08/2016  Decreased Interest 1 0  Down, Depressed, Hopeless 1 0  PHQ - 2 Score 2 0  Altered sleeping 2 -  Tired, decreased energy 2 -  Change in appetite 0 -  Feeling bad or failure about yourself  1 -  Trouble concentrating 0 -  Moving slowly or fidgety/restless 0 -  Suicidal thoughts 0 -  PHQ-9 Score 7 -  Difficult doing work/chores Somewhat difficult -   Review of Systems  Constitutional: Negative for chills and fever.  HENT: Negative for hearing loss and  tinnitus.   Eyes: Negative for blurred vision and double vision.  Respiratory: Negative for cough and hemoptysis.   Cardiovascular: Negative for chest pain and palpitations.  Gastrointestinal: Negative for heartburn and nausea.  Genitourinary: Negative for dysuria and urgency.  Musculoskeletal: Negative for myalgias and neck pain.  Skin: Negative for rash.  Neurological: Negative for dizziness and headaches.  Endo/Heme/Allergies: Does not bruise/bleed easily.  Psychiatric/Behavioral: Negative for depression and suicidal ideas.    Patient Active Problem List   Diagnosis Date Noted  . Mixed hyperlipidemia 10/03/2018  . Obesity (BMI 30.0-34.9) 10/03/2018  . 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 Anxiety 07/11/2018  . 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 Contraception management 03/05/2014  . Depression     Social History   Tobacco Use  . Smoking status: Never Smoker  . Smokeless tobacco: Never Used  Substance Use Topics  . Alcohol use: Yes    Alcohol/week: 2.0 standard drinks    Types: 2 Standard drinks or equivalent per week   Current Outpatient Medications:  .  escitalopram (LEXAPRO) 5 MG tablet, Take 5 mg by mouth daily., Disp: , Rfl:  .  Glucosamine 750 MG TABS, Take by mouth., Disp: , Rfl:  .  Multiple Vitamins-Minerals (MULTIVITAMIN PO), Take by mouth daily., Disp: , Rfl:  .  TURMERIC PO, Take by mouth., Disp: , Rfl:   No Known Allergies  Objective:   VITALS: Per patient if applicable, see vitals. GENERAL: Alert, appears well and in no acute distress. HEENT: Atraumatic, conjunctiva clear, no obvious abnormalities on inspection of external nose and ears. NECK: Normal movements of the head and neck.  CARDIOPULMONARY: No increased WOB. Speaking in clear sentences. I:E ratio WNL.  MS: Moves all visible extremities without noticeable abnormality. PSYCH: Pleasant and cooperative, well-groomed. Speech normal rate and rhythm. Affect is appropriate. Insight and  judgement are appropriate. Attention is focused, linear, and appropriate.  NEURO: CN grossly intact. Oriented as arrived to appointment on time with no prompting. Moves both UE equally.  SKIN: No obvious lesions, wounds, erythema, or cyanosis noted on face or hands.  Assessment and Plan:   Joyce Gray was seen today for establish care.  Diagnoses and all orders for this visit:  Anxiety  HPV (human papilloma virus) infection  Reactive depression  SI joint arthritis -     naproxen (NAPROSYN) 500 MG tablet; Take 1 tablet (500 mg total) by mouth 2 (two) times daily with a meal. -     Ambulatory referral to Sports Medicine  Mixed hyperlipidemia -     Lipid panel; Future  Obesity (BMI 30.0-34.9) -     CBC with Differential/Platelet; Future -     Comprehensive metabolic panel; Future -     TSH; Future -     Hemoglobin A1c; Future -     Insulin, Free (Bioactive); Future  Vitamin B 12 deficiency -     Vitamin B12; Future  Insomnia due to other mental disorder -     traZODone (DESYREL) 50 MG tablet; Take 0.5-1 tablets (25-50 mg total) by mouth at bedtime as needed for sleep.  Hyperglycemia -     Hemoglobin A1c; Future -     Insulin, Free (Bioactive); Future  Malaise and fatigue -     CBC with Differential/Platelet; Future -     Comprehensive metabolic panel; Future -     TSH; Future -     Vitamin B12; Future   . COVID-19 Education: The signs and symptoms of COVID-19 were discussed with the patient and how to seek care for testing if needed. The importance of social distancing was discussed today. . Reviewed expectations re: course of current medical issues. . Discussed self-management of symptoms. . Outlined signs and symptoms indicating need for more acute intervention. . Patient verbalized understanding and all questions were answered. Marland Kitchen Health Maintenance issues including appropriate healthy diet, exercise, and smoking avoidance were discussed with patient. . See orders for  this visit as documented in the electronic medical record.  Joyce Deutscher, DO  Records requested if needed. Time spent: 45 minutes, of which >50% was spent in obtaining information about her symptoms, reviewing her previous labs, evaluations, and treatments, counseling her about her condition (please see the discussed topics above), and developing a plan to further investigate it; she had a number of questions which I addressed.

## 2018-10-03 ENCOUNTER — Encounter: Payer: Self-pay | Admitting: Family Medicine

## 2018-10-03 ENCOUNTER — Ambulatory Visit (INDEPENDENT_AMBULATORY_CARE_PROVIDER_SITE_OTHER): Payer: BC Managed Care – PPO | Admitting: Family Medicine

## 2018-10-03 ENCOUNTER — Other Ambulatory Visit: Payer: Self-pay

## 2018-10-03 VITALS — Ht 69.0 in | Wt 220.0 lb

## 2018-10-03 DIAGNOSIS — F329 Major depressive disorder, single episode, unspecified: Secondary | ICD-10-CM | POA: Diagnosis not present

## 2018-10-03 DIAGNOSIS — E66811 Obesity, class 1: Secondary | ICD-10-CM | POA: Insufficient documentation

## 2018-10-03 DIAGNOSIS — E785 Hyperlipidemia, unspecified: Secondary | ICD-10-CM | POA: Insufficient documentation

## 2018-10-03 DIAGNOSIS — F5105 Insomnia due to other mental disorder: Secondary | ICD-10-CM

## 2018-10-03 DIAGNOSIS — R739 Hyperglycemia, unspecified: Secondary | ICD-10-CM

## 2018-10-03 DIAGNOSIS — F419 Anxiety disorder, unspecified: Secondary | ICD-10-CM

## 2018-10-03 DIAGNOSIS — E538 Deficiency of other specified B group vitamins: Secondary | ICD-10-CM

## 2018-10-03 DIAGNOSIS — M47818 Spondylosis without myelopathy or radiculopathy, sacral and sacrococcygeal region: Secondary | ICD-10-CM | POA: Diagnosis not present

## 2018-10-03 DIAGNOSIS — E782 Mixed hyperlipidemia: Secondary | ICD-10-CM

## 2018-10-03 DIAGNOSIS — B977 Papillomavirus as the cause of diseases classified elsewhere: Secondary | ICD-10-CM

## 2018-10-03 DIAGNOSIS — R5381 Other malaise: Secondary | ICD-10-CM

## 2018-10-03 DIAGNOSIS — F99 Mental disorder, not otherwise specified: Secondary | ICD-10-CM

## 2018-10-03 DIAGNOSIS — R5383 Other fatigue: Secondary | ICD-10-CM

## 2018-10-03 DIAGNOSIS — E669 Obesity, unspecified: Secondary | ICD-10-CM

## 2018-10-03 MED ORDER — TRAZODONE HCL 50 MG PO TABS: 25.0000 mg | ORAL_TABLET | Freq: Every evening | ORAL | 3 refills | Status: DC | PRN

## 2018-10-03 MED ORDER — NAPROXEN 500 MG PO TABS: 500.0000 mg | ORAL_TABLET | Freq: Two times a day (BID) | ORAL | 0 refills | Status: DC

## 2018-10-08 ENCOUNTER — Encounter: Payer: Self-pay | Admitting: Family Medicine

## 2018-10-09 ENCOUNTER — Other Ambulatory Visit (INDEPENDENT_AMBULATORY_CARE_PROVIDER_SITE_OTHER): Payer: BC Managed Care – PPO

## 2018-10-09 ENCOUNTER — Other Ambulatory Visit: Payer: Self-pay

## 2018-10-09 DIAGNOSIS — E669 Obesity, unspecified: Secondary | ICD-10-CM

## 2018-10-09 DIAGNOSIS — R5383 Other fatigue: Secondary | ICD-10-CM

## 2018-10-09 DIAGNOSIS — E538 Deficiency of other specified B group vitamins: Secondary | ICD-10-CM | POA: Diagnosis not present

## 2018-10-09 DIAGNOSIS — E782 Mixed hyperlipidemia: Secondary | ICD-10-CM | POA: Diagnosis not present

## 2018-10-09 DIAGNOSIS — R739 Hyperglycemia, unspecified: Secondary | ICD-10-CM

## 2018-10-09 DIAGNOSIS — R5381 Other malaise: Secondary | ICD-10-CM

## 2018-10-09 LAB — CBC WITH DIFFERENTIAL/PLATELET
Basophils Absolute: 0 10*3/uL (ref 0.0–0.1)
Basophils Relative: 0.3 % (ref 0.0–3.0)
Eosinophils Absolute: 0.2 10*3/uL (ref 0.0–0.7)
Eosinophils Relative: 2 % (ref 0.0–5.0)
HCT: 38.3 % (ref 36.0–46.0)
Hemoglobin: 13.1 g/dL (ref 12.0–15.0)
Lymphocytes Relative: 27 % (ref 12.0–46.0)
Lymphs Abs: 2.8 10*3/uL (ref 0.7–4.0)
MCHC: 34.2 g/dL (ref 30.0–36.0)
MCV: 88.9 fl (ref 78.0–100.0)
Monocytes Absolute: 0.5 10*3/uL (ref 0.1–1.0)
Monocytes Relative: 5.1 % (ref 3.0–12.0)
Neutro Abs: 6.8 10*3/uL (ref 1.4–7.7)
Neutrophils Relative %: 65.6 % (ref 43.0–77.0)
Platelets: 335 10*3/uL (ref 150.0–400.0)
RBC: 4.31 Mil/uL (ref 3.87–5.11)
RDW: 13.4 % (ref 11.5–15.5)
WBC: 10.3 10*3/uL (ref 4.0–10.5)

## 2018-10-09 LAB — LIPID PANEL
Cholesterol: 184 mg/dL (ref 0–200)
HDL: 40.8 mg/dL (ref >39.00)
LDL Cholesterol: 114 mg/dL — ABNORMAL HIGH (ref 0–99)
NonHDL: 143.06
Total CHOL/HDL Ratio: 5
Triglycerides: 145 mg/dL (ref 0.0–149.0)
VLDL: 29 mg/dL (ref 0.0–40.0)

## 2018-10-09 LAB — COMPREHENSIVE METABOLIC PANEL
ALT: 10 U/L (ref 0–35)
AST: 13 U/L (ref 0–37)
Albumin: 4 g/dL (ref 3.5–5.2)
Alkaline Phosphatase: 67 U/L (ref 39–117)
BUN: 13 mg/dL (ref 6–23)
CO2: 29 mEq/L (ref 19–32)
Calcium: 8.9 mg/dL (ref 8.4–10.5)
Chloride: 102 mEq/L (ref 96–112)
Creatinine, Ser: 0.68 mg/dL (ref 0.40–1.20)
GFR: 94.86 mL/min (ref >60.00)
Glucose, Bld: 79 mg/dL (ref 70–99)
Potassium: 4.1 mEq/L (ref 3.5–5.1)
Sodium: 138 mEq/L (ref 135–145)
Total Bilirubin: 0.4 mg/dL (ref 0.2–1.2)
Total Protein: 6.7 g/dL (ref 6.0–8.3)

## 2018-10-09 LAB — HEMOGLOBIN A1C: Hgb A1c MFr Bld: 5.3 % (ref 4.6–6.5)

## 2018-10-09 LAB — TSH: TSH: 2.79 u[IU]/mL (ref 0.35–4.50)

## 2018-10-09 LAB — VITAMIN B12: Vitamin B-12: 465 pg/mL (ref 211–911)

## 2018-10-10 ENCOUNTER — Encounter: Payer: Self-pay | Admitting: Physician Assistant

## 2018-10-10 ENCOUNTER — Ambulatory Visit (INDEPENDENT_AMBULATORY_CARE_PROVIDER_SITE_OTHER): Payer: BC Managed Care – PPO | Admitting: Physician Assistant

## 2018-10-10 VITALS — Ht 69.0 in | Wt 220.0 lb

## 2018-10-10 DIAGNOSIS — Z713 Dietary counseling and surveillance: Secondary | ICD-10-CM

## 2018-10-10 DIAGNOSIS — E669 Obesity, unspecified: Secondary | ICD-10-CM | POA: Diagnosis not present

## 2018-10-10 NOTE — Progress Notes (Signed)
Virtual Visit via Video   I connected with Joyce Gray on 10/10/18 at  3:00 PM EDT by a video enabled telemedicine application and verified that I am speaking with the correct person using two identifiers. Location patient: Home Location provider: [facility], Office Persons participating in the virtual visit: Joyce Gray, Joyce Coke PA-C, Anselmo Pickler, LPN   I discussed the limitations of evaluation and management by telemedicine and the availability of in person appointments. The patient expressed understanding and agreed to proceed.  I acted as a Education administrator for [employer], [employer], LPN  Subjective:   HPI:  Nutrition Counseling Pt to discuss diet and nutrition, she would like to lose 45 lbs.   Dietary recall: Wakes up at 6-6:30 Breakfast -- coffee with sweetened creamer and sugar; old fashioned oats with 1 tbsp PB and 1 tbsp brown sugar Midmorning snack -- 2 x granola bars Lunch -- cheese toast Afternoon snack -- 2 x granola bars; or apple and granola bar Dinner -- homemade soup; chicken; eats out 5-6 days a week ([employer], etc) After dinner snack -- 2 x granola bars Beverages -- 16-24 oz of juice -- either green V8 juice or orange juice ETOH -- monthly  Weight: Wt Readings from Last 3 Encounters:  10/10/18 220 lb (99.8 kg)  10/03/18 220 lb (99.8 kg)  07/05/18 219 lb 6.4 oz (99.5 kg)   Exercise: Pt is exercising 3- 4 times a week, walking and yoga.   Support system: family  Sleep: Variable, reluctant to try trazodone  Goals: 1- lose weight 2- feel satisfied with meals 3- feel better  Estimated daily energy needs: Calories: 1500-1650 kcal Protein: 65-80 g Fluid: at least 2000 ml   ROS: See pertinent positives and negatives per HPI.  Patient Active Problem List   Diagnosis Date Noted  . Mixed hyperlipidemia 10/03/2018  . Obesity (BMI 30.0-34.9) 10/03/2018  . 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 Anxiety  07/11/2018  . 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 Contraception management 03/05/2014  . Depression     Social History   Tobacco Use  . Smoking status: Never Smoker  . Smokeless tobacco: Never Used  Substance Use Topics  . Alcohol use: Yes    Alcohol/week: 2.0 standard drinks    Types: 2 Standard drinks or equivalent per week    Current Outpatient Medications:  .  escitalopram (LEXAPRO) 5 MG tablet, Take 5 mg by mouth daily., Disp: , Rfl:  .  GLUCOSAMINE-CHONDROITIN PO, Take by mouth. Glucosamine 1500 mg & Chondroitin 120 mg, Disp: , Rfl:  .  Multiple Vitamins-Minerals (MULTIVITAMIN PO), Take by mouth daily., Disp: , Rfl:  .  naproxen (NAPROSYN) 500 MG tablet, Take 1 tablet (500 mg total) by mouth 2 (two) times daily with a meal., Disp: 30 tablet, Rfl: 0 .  traZODone (DESYREL) 50 MG tablet, Take 0.5-1 tablets (25-50 mg total) by mouth at bedtime as needed for sleep., Disp: 30 tablet, Rfl: 3  No Known Allergies  Objective:   VITALS: Per patient if applicable, see vitals. GENERAL: Alert, appears well and in no acute distress. HEENT: Atraumatic, conjunctiva clear, no obvious abnormalities on inspection of external nose and ears. NECK: Normal movements of the head and neck. CARDIOPULMONARY: No increased WOB. Speaking in clear sentences. I:E ratio WNL.  MS: Moves all visible extremities without noticeable abnormality. PSYCH: Pleasant and cooperative, well-groomed. Speech normal rate and rhythm. Affect is appropriate. Insight and judgement are appropriate. Attention is focused, linear, and appropriate.  NEURO: CN grossly intact. Oriented as arrived to appointment on time with no prompting. Moves both UE equally.  SKIN: No obvious lesions, wounds, erythema, or cyanosis noted on face or hands.  Assessment and Plan:   [name] was seen today for nutrition counseling.  Diagnoses and all orders for this visit:  Encounter for nutritional counseling; Obesity (BMI 30.0-34.9) Discussed  specific, individualized recommendations regarding nutrition including decreasing sugary beverages, limiting portions, balancing out meals, and eating regularly throughout the day. Handouts provided included: Balanced Plate, Balanced Snack List, Balanced Breakfast, 1800 Calorie Sample Menus. Provided emotional support and encouraged slow, steady weight loss. Patient's questions answered throughout encounter. Follow-up with me prn.  Specific recommendations include: 1. Reduce the juice. Lets aim for no more than 4-8 oz a day. 2. Add more protein to your breakfast (see breakfast handout.) 3. Instead of granola bars, lets find a snack that is less processed. Lets try to choose a good source of carbohydrate and protein for each snack (see snack list). Drink a full glass of water with these snacks. If you are still hungry, add some non-starchy vegetables (all vegetables except for potatoes, corn, peas.) 4. Avoid low-fat products, they usually have extra sugar and salt!  . Reviewed expectations re: course of current medical issues. . Discussed self-management of symptoms. . Outlined signs and symptoms indicating need for more acute intervention. . Patient verbalized understanding and all questions were answered. Marland Kitchen Health Maintenance issues including appropriate healthy diet, exercise, and smoking avoidance were discussed with patient. . See orders for this visit as documented in the electronic medical record.  I discussed the assessment and treatment plan with the patient. The patient was provided an opportunity to ask questions and all were answered. The patient agreed with the plan and demonstrated an understanding of the instructions.   The patient was advised to call back or seek an in-person evaluation if the symptoms worsen or if the condition fails to improve as anticipated.   CMA or LPN served as scribe during this visit. History, Physical, and Plan performed by medical provider. The above  documentation has been reviewed and is accurate and complete.  I spent 40 minutes with this patient, greater than 50% was face-to-face time counseling regarding the above diagnoses.  [place], [place] 10/10/2018

## 2018-10-10 NOTE — Patient Instructions (Addendum)
It was great to talk to you!  Recommendations from today:  1. Reduce the juice. Lets aim for no more than 4-8 oz a day.  2. Add more protein to your breakfast (see breakfast handout.)  3. Instead of granola bars, lets find a snack that is less processed. Lets try to choose a good source of carbohydrate and protein for each snack (see snack list). Drink a full glass of water with these snacks. If you are still hungry, add some non-starchy vegetables (all vegetables except for potatoes, corn, peas.)  4. Avoid low-fat products, they usually have extra sugar and salt!  Take care!   Aldona Bar

## 2018-10-13 LAB — INSULIN, FREE (BIOACTIVE): Insulin, Free: 5.3 u[IU]/mL (ref 1.5–14.9)

## 2018-10-16 NOTE — Progress Notes (Signed)
[name] [place], [place] [address] Phone: [phone] Subjective:   Fontaine No, am serving as a scribe for Dr. Hulan Saas.  I'm seeing this patient by the request  of:    CC: low back pain   OMV:[id number]  Joyce Gray is a 43 y.o. female coming in with complaint of SI jt pain on the right side. Has had pain since 2018. She tried to do Zumba and increase her physical activity which seemed to cause acute back pain. Was trying glucosamine for her pain. Came off of it because she thought it was increasing her blood pressure. Did have MRI with Percell Miller and Noemi Chapel in 2018 due to the ongoing back pain. States that MRI was normal. Was told that she has arthritis on the right side. Does have pain going down the back of thigh as well as in the back of her calf. Is having numbness in the 2nd-4th. Does have rx for naproxen and only uses it prn.  Patient states any time she seems to do increasing activity has more difficulty.  Has seen multiple providers for this previously.  Had had an injection one time that did give her some improvement for months.    Past Medical History:  Diagnosis Date  . Abnormal Pap smear of cervix 1999, 2016  . Anxiety   . Depression   . Incomplete right bundle branch block    Past Surgical History:  Procedure Laterality Date  . COLPOSCOPY  2016  . CRYOTHERAPY  1999   for abnormal pap smear  . NO PAST SURGERIES    . TYMPANOSTOMY TUBE PLACEMENT     Social History   Socioeconomic History  . Marital status: Single    Spouse name: Not on file  . Number of children: 0  . Years of education: 87  . Highest education level: Not on file  Occupational History  . Occupation: Product manager: [employer]  . Financial resource strain: Not on file  . Food insecurity:    Worry: Not on file    Inability: Not on file  . Transportation needs:    Medical: Not on file    Non-medical: Not on  file  Tobacco Use  . Smoking status: Never Smoker  . Smokeless tobacco: Never Used  Substance and Sexual Activity  . Alcohol use: Yes    Alcohol/week: 2.0 standard drinks    Types: 2 Standard drinks or equivalent per week  . Drug use: No  . Sexual activity: Not Currently    Partners: Male    Birth control/protection: Abstinence  Lifestyle  . Physical activity:    Days per week: Not on file    Minutes per session: Not on file  . Stress: Not on file  Relationships  . Social connections:    Talks on phone: Not on file    Gets together: Not on file    Attends religious service: Not on file    Active member of club or organization: Not on file    Attends meetings of clubs or organizations: Not on file    Relationship status: Not on file  Other Topics Concern  . Not on file  Social History Narrative   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 Fun/Hobby: Hiking, reading, coffee with friends, dancing.    No Known Allergies Family History  Problem Relation Age of Onset  . Hyperlipidemia Mother   . Alcohol abuse Mother   . Melanoma  Father   . Melanoma Paternal Aunt 53  . Heart disease Maternal Grandfather   . Breast cancer Maternal Aunt 63       Current Outpatient Medications (Analgesics):  .  naproxen (NAPROSYN) 500 MG tablet, Take 1 tablet (500 mg total) by mouth 2 (two) times daily with a meal.   Current Outpatient Medications (Other):  .  escitalopram (LEXAPRO) 5 MG tablet, Take 5 mg by mouth daily. Marland Kitchen  GLUCOSAMINE-CHONDROITIN PO, Take by mouth. Glucosamine 1500 mg & Chondroitin 120 mg .  Multiple Vitamins-Minerals (MULTIVITAMIN PO), Take by mouth daily. .  traZODone (DESYREL) 50 MG tablet, Take 0.5-1 tablets (25-50 mg total) by mouth at bedtime as needed for sleep. .  Diclofenac Sodium (PENNSAID) 2 % SOLN, Place 2 g onto the skin 2 (two) times daily. Marland Kitchen  gabapentin (NEURONTIN) 100 MG capsule, Take 2 capsules (200 mg total) by mouth at bedtime.    Past medical history, social,  surgical and family history all reviewed in electronic medical record.  No pertanent information unless stated regarding to the chief complaint.   Review of Systems:  No headache, visual changes, nausea, vomiting, diarrhea, constipation, dizziness, abdominal pain, skin rash, fevers, chills, night sweats, weight loss, swollen lymph nodes, body aches, joint swelling, chest pain, shortness of breath, mood changes.  Positive muscle aches  Objective  Blood pressure 102/72, pulse 76, height 5\' 9"  (1.753 m), weight 220 lb (99.8 kg), last menstrual period 09/26/2018, SpO2 96 %.    General: No apparent distress alert and oriented x3 mood and affect normal, dressed appropriately.  HEENT: Pupils equal, extraocular movements intact  Respiratory: Patient's speak in full sentences and does not appear short of breath  Cardiovascular: No lower extremity edema, non tender, no erythema  Skin: Warm dry intact with no signs of infection or rash on extremities or on axial skeleton.  Abdomen: Soft nontender  Neuro: Cranial nerves II through XII are intact, neurovascularly intact in all extremities with 2+ DTRs and 2+ pulses.  Lymph: No lymphadenopathy of posterior or anterior cervical chain or axillae bilaterally.  Gait normal with good balance and coordination.  MSK:  Non tender with full range of motion and good stability and symmetric strength and tone of shoulders, elbows, wrist, hip, knee and ankles bilaterally.  Low back exam shows the patient does have some mild loss of lordosis.  Patient does have some poor core strength. Negative straight leg test.  Positive Faber test minorly.  Weakness the hip abductor 3-5 strength in the right compared to 4-5 strength on the left.  Tender to palpation of the right sacroiliac joint  [id number]; 15 additional minutes spent for Therapeutic exercises as stated in above notes.  This included exercises focusing on stretching, strengthening, with significant focus on eccentric  aspects.   Long term goals include an improvement in range of motion, strength, endurance as well as avoiding reinjury. Patient's frequency would include in 1-2 times a day, 3-5 times a week for a duration of 6-12 weeks. Low back exercises that included:  Pelvic tilt/bracing instruction to focus on control of the pelvic girdle and lower abdominal muscles  Glute strengthening exercises, focusing on proper firing of the glutes without engaging the low back muscles Proper stretching techniques for maximum relief for the hamstrings, hip flexors, low back and some rotation where tolerated   Proper technique shown and discussed handout in great detail with ATC.  All questions were discussed and answered.      Impression and Recommendations:  This case required medical decision making of moderate complexity. The above documentation has been reviewed and is accurate and complete Lyndal Pulley, DO       Note: This dictation was prepared with Dragon dictation along with smaller phrase technology. Any transcriptional errors that result from this process are unintentional.

## 2018-10-17 ENCOUNTER — Ambulatory Visit (INDEPENDENT_AMBULATORY_CARE_PROVIDER_SITE_OTHER): Payer: BC Managed Care – PPO | Admitting: Family Medicine

## 2018-10-17 ENCOUNTER — Other Ambulatory Visit: Payer: Self-pay

## 2018-10-17 ENCOUNTER — Encounter: Payer: Self-pay | Admitting: Family Medicine

## 2018-10-17 VITALS — BP 102/72 | HR 76 | Ht 69.0 in | Wt 220.0 lb

## 2018-10-17 DIAGNOSIS — M533 Sacrococcygeal disorders, not elsewhere classified: Secondary | ICD-10-CM

## 2018-10-17 DIAGNOSIS — G8929 Other chronic pain: Secondary | ICD-10-CM

## 2018-10-17 DIAGNOSIS — M47818 Spondylosis without myelopathy or radiculopathy, sacral and sacrococcygeal region: Secondary | ICD-10-CM

## 2018-10-17 MED ORDER — DICLOFENAC SODIUM 2 % TD SOLN: 2.0000 g | Freq: Two times a day (BID) | TRANSDERMAL | 3 refills | Status: DC

## 2018-10-17 MED ORDER — GABAPENTIN 100 MG PO CAPS: 200.0000 mg | ORAL_CAPSULE | Freq: Every day | ORAL | 3 refills | Status: DC

## 2018-10-17 NOTE — Assessment & Plan Note (Signed)
Patient has had discomfort and pain for quite some time.  Has had an MRI from an outside facility that was fairly unremarkable for any nerve root impingement.  Differential includes sacroiliac joint dysfunction, mild arthritis, piriformis syndrome causing more of a nerve impingement.  Discussed that patient has done a very good job with more of the range of motion and the stretching but I believe that patient is unfortunately continued to have weakness that continues to cause fatigue of the muscles.  We discussed over-the-counter medications, we discussed gabapentin to help with some of the neurologic symptoms.  Home exercises, work with Product/process development scientist.  Follow-up with me again 4 weeks.  Could be a candidate for manipulation at follow-up.

## 2018-10-17 NOTE — Patient Instructions (Addendum)
Good to see you  Ice is your friend Ice 20 minutes 2 times daily. Usually after activity and before bed. pennsaid pinkie amount topically 2 times daily as needed.  Gabapentin 200mg  at night Over the counter get  Vitamin D 2000 Iu daily  Turmeric 500mg  daily  Tart cherry extract any dose at night [employer] orthotics "total support" online would be great  Exercises 3 times a week.   See me again in 4 weeks and ifnot better we will consider pT or manipulation  Stay safe

## 2018-10-26 ENCOUNTER — Other Ambulatory Visit: Payer: Self-pay | Admitting: Family Medicine

## 2018-10-26 DIAGNOSIS — F5105 Insomnia due to other mental disorder: Secondary | ICD-10-CM

## 2018-11-09 ENCOUNTER — Ambulatory Visit (INDEPENDENT_AMBULATORY_CARE_PROVIDER_SITE_OTHER): Payer: BC Managed Care – PPO | Admitting: Physician Assistant

## 2018-11-09 ENCOUNTER — Encounter: Payer: Self-pay | Admitting: Physician Assistant

## 2018-11-09 VITALS — Ht 69.0 in | Wt 221.0 lb

## 2018-11-09 DIAGNOSIS — Z713 Dietary counseling and surveillance: Secondary | ICD-10-CM

## 2018-11-09 DIAGNOSIS — E669 Obesity, unspecified: Secondary | ICD-10-CM | POA: Diagnosis not present

## 2018-11-09 NOTE — Progress Notes (Signed)
Virtual Visit via Video   I connected with Joyce Gray on 11/09/18 at 11:00 AM EDT by a video enabled telemedicine application and verified that I am speaking with the correct person using two identifiers. Location patient: Home Location provider: [facility], Office Persons participating in the virtual visit: Selita Staiger, [name] PA-C.  I discussed the limitations of evaluation and management by telemedicine and the availability of in person appointments. The patient expressed understanding and agreed to proceed.  I acted as a Education administrator for [employer], PA-C [employer], LPN  Subjective:   HPI:  Nutrition Pt following up today from nutrition visit on 5/20.  Since her last visit with me she has reduced her granola bar and sweets intake, however she continues to struggle with cravings. She has started exercising more -- found that meeting with Dr. Charlann Boxer was extremely helpful and her joint pain is improving with the recommendations that he made.  She trialed MyFitness pal for a bit and found it helpful. She is overall up one pound since she last saw me.  Wt Readings from Last 5 Encounters:  11/09/18 221 lb (100.2 kg)  10/17/18 220 lb (99.8 kg)  10/10/18 220 lb (99.8 kg)  10/03/18 220 lb (99.8 kg)  07/05/18 219 lb 6.4 oz (99.5 kg)     ROS: See pertinent positives and negatives per HPI.  Patient Active Problem List   Diagnosis Date Noted  . Mixed hyperlipidemia 10/03/2018  . Obesity (BMI 30.0-34.9) 10/03/2018  . 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 Anxiety 07/11/2018  . 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 Contraception management 03/05/2014  . Depression     Social History   Tobacco Use  . Smoking status: Never Smoker  . Smokeless tobacco: Never Used  Substance Use Topics  . Alcohol use: Yes    Alcohol/week: 2.0 standard drinks    Types: 2 Standard drinks or equivalent per week    Current Outpatient Medications:  .   escitalopram (LEXAPRO) 5 MG tablet, Take 5 mg by mouth daily., Disp: , Rfl:  .  GLUCOSAMINE-CHONDROITIN PO, Take by mouth. Glucosamine 1500 mg & Chondroitin 120 mg, Disp: , Rfl:  .  Multiple Vitamins-Minerals (MULTIVITAMIN PO), Take by mouth daily., Disp: , Rfl:  .  naproxen (NAPROSYN) 500 MG tablet, Take 1 tablet (500 mg total) by mouth 2 (two) times daily with a meal. (Patient taking differently: Take 500 mg by mouth as needed. ), Disp: 30 tablet, Rfl: 0 .  gabapentin (NEURONTIN) 100 MG capsule, Take 2 capsules (200 mg total) by mouth at bedtime. (Patient not taking: Reported on 11/09/2018), Disp: 60 capsule, Rfl: 3 .  traZODone (DESYREL) 50 MG tablet, TAKE 0.5-1 TABLETS (25-50 MG TOTAL) BY MOUTH AT BEDTIME AS NEEDED FOR SLEEP. (Patient not taking: Reported on 11/09/2018), Disp: 90 tablet, Rfl: 2  No Known Allergies  Objective:   VITALS: Per patient if applicable, see vitals. GENERAL: Alert, appears well and in no acute distress. HEENT: Atraumatic, conjunctiva clear, no obvious abnormalities on inspection of external nose and ears. NECK: Normal movements of the head and neck. CARDIOPULMONARY: No increased WOB. Speaking in clear sentences. I:E ratio WNL.  MS: Moves all visible extremities without noticeable abnormality. PSYCH: Pleasant and cooperative, well-groomed. Speech normal rate and rhythm. Affect is appropriate. Insight and judgement are appropriate. Attention is focused, linear, and appropriate.  NEURO: CN grossly intact. Oriented as arrived to appointment on time with no prompting. Moves both UE equally.  SKIN: No obvious  lesions, wounds, erythema, or cyanosis noted on face or hands.  Assessment and Plan:   Karletta was seen today for f/u nutrition.  Diagnoses and all orders for this visit:  Obesity (BMI 30.0-34.9)  Encounter for nutritional counseling   Emotional support and recommendations made today. Encouraged her to try MyFitness Pal for a full week to see what sort of  information she can gain from her current eating habits. I also recommended that she find some healthier options to satisfy her sweet tooth, such as yogurts, fruit, trail mix. Watch portion sizes with take-out.  Follow-up with me prn.  . Reviewed expectations re: course of current medical issues. . Discussed self-management of symptoms. . Outlined signs and symptoms indicating need for more acute intervention. . Patient verbalized understanding and all questions were answered. Marland Kitchen Health Maintenance issues including appropriate healthy diet, exercise, and smoking avoidance were discussed with patient. . See orders for this visit as documented in the electronic medical record.  I discussed the assessment and treatment plan with the patient. The patient was provided an opportunity to ask questions and all were answered. The patient agreed with the plan and demonstrated an understanding of the instructions.   The patient was advised to call back or seek an in-person evaluation if the symptoms worsen or if the condition fails to improve as anticipated.   CMA or LPN served as scribe during this visit. History, Physical, and Plan performed by medical provider. The above documentation has been reviewed and is accurate and complete.   [place], [place] 11/09/2018

## 2018-11-13 ENCOUNTER — Ambulatory Visit: Payer: BC Managed Care – PPO | Admitting: Family Medicine

## 2018-11-14 ENCOUNTER — Ambulatory Visit: Payer: BC Managed Care – PPO | Admitting: Physician Assistant

## 2018-11-14 ENCOUNTER — Ambulatory Visit: Payer: BC Managed Care – PPO | Admitting: Family Medicine

## 2018-11-28 ENCOUNTER — Encounter: Payer: Self-pay | Admitting: Certified Nurse Midwife

## 2019-02-06 ENCOUNTER — Other Ambulatory Visit: Payer: Self-pay

## 2019-02-08 ENCOUNTER — Ambulatory Visit: Payer: BC Managed Care – PPO | Admitting: Certified Nurse Midwife

## 2019-02-09 DIAGNOSIS — A770 Spotted fever due to Rickettsia rickettsii: Secondary | ICD-10-CM | POA: Insufficient documentation

## 2019-02-15 ENCOUNTER — Ambulatory Visit (INDEPENDENT_AMBULATORY_CARE_PROVIDER_SITE_OTHER): Payer: BC Managed Care – PPO | Admitting: Family Medicine

## 2019-02-15 ENCOUNTER — Encounter: Payer: Self-pay | Admitting: Family Medicine

## 2019-02-15 VITALS — Temp 98.6°F

## 2019-02-15 DIAGNOSIS — S80261A Insect bite (nonvenomous), right knee, initial encounter: Secondary | ICD-10-CM | POA: Diagnosis not present

## 2019-02-15 DIAGNOSIS — M25561 Pain in right knee: Secondary | ICD-10-CM | POA: Diagnosis not present

## 2019-02-15 DIAGNOSIS — W57XXXA Bitten or stung by nonvenomous insect and other nonvenomous arthropods, initial encounter: Secondary | ICD-10-CM

## 2019-02-15 MED ORDER — DOXYCYCLINE HYCLATE 100 MG PO TABS: 100.0000 mg | ORAL_TABLET | Freq: Two times a day (BID) | ORAL | 0 refills | Status: AC

## 2019-02-15 NOTE — Progress Notes (Signed)
Virtual Visit via Video Note  Subjective  CC:  Chief Complaint  Patient presents with  . Tick Removal    Noticed yesterday; right knee Swelling, tenderness, diarrhea, and redness.Marland Kitchen Has not tried anything     I connected with [name] on 02/16/19 at  4:20 PM EDT by a video enabled telemedicine application and verified that I am speaking with the correct person using two identifiers. Location patient: Home Location provider: [facility] [place], Office Persons participating in the virtual visit: Zakiyah Dasari, Leamon Arnt, MD Lilli Light, [place] discussed the limitations of evaluation and management by telemedicine and the availability of in person appointments. The patient expressed understanding and agreed to proceed. HPI: Joyce Gray is a 43 y.o. female who was contacted today to address the problems listed above in the chief complaint. . As above, was at park last weekend. Yesterday noted red sore spot on right knee with bite mark and surrounding erythema. Also with some joint aches and malaise w/o fever. Worries it may be from tick bite although never found a tick. Mom dxd with lymes disease. No n/v, headache.   Assessment  1. Tick bite, initial encounter   2. Arthralgia of right knee      Plan   Possible tick bite with rash and arthralgia:  Discussed different possibilities. Will cover with doxy and monitor for improvement in rash and sxs. F/u if worsens.  I discussed the assessment and treatment plan with the patient. The patient was provided an opportunity to ask questions and all were answered. The patient agreed with the plan and demonstrated an understanding of the instructions.   The patient was advised to call back or seek an in-person evaluation if the symptoms worsen or if the condition fails to improve as anticipated. Follow up: Return if symptoms worsen or fail to improve.  Visit date not found  Meds ordered this encounter   Medications  . doxycycline (VIBRA-TABS) 100 MG tablet    Sig: Take 1 tablet (100 mg total) by mouth 2 (two) times daily for 7 days.    Dispense:  14 tablet    Refill:  0      I reviewed the patients updated PMH, FH, and SocHx.    Patient Active Problem List   Diagnosis Date Noted  . Mixed hyperlipidemia 10/03/2018  . Obesity (BMI 30.0-34.9) 10/03/2018  . 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 Anxiety 07/11/2018  . 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 Contraception management 03/05/2014  . Depression    Current Meds  Medication Sig  . cholecalciferol (VITAMIN D3) 25 MCG (1000 UT) tablet Take 1,000 Units by mouth daily.  Marland Kitchen escitalopram (LEXAPRO) 5 MG tablet Take 5 mg by mouth daily.  Marland Kitchen gabapentin (NEURONTIN) 100 MG capsule Take 2 capsules (200 mg total) by mouth at bedtime.  Marland Kitchen GLUCOSAMINE-CHONDROITIN PO Take by mouth. Glucosamine 1500 mg & Chondroitin 120 mg  . Multiple Vitamins-Minerals (MULTIVITAMIN PO) Take by mouth daily.  . traZODone (DESYREL) 50 MG tablet TAKE 0.5-1 TABLETS (25-50 MG TOTAL) BY MOUTH AT BEDTIME AS NEEDED FOR SLEEP.    Allergies: Patient has No Known Allergies. Family History: Patient family history includes Alcohol abuse in her mother; Breast cancer (age of onset: 50) in her maternal aunt; Heart disease in her maternal grandfather; Hyperlipidemia in her mother; Melanoma in her father; Melanoma (age of onset: 79) in her paternal aunt. Social History:  Patient  reports that she  has never smoked. She has never used smokeless tobacco. She reports current alcohol use of about 2.0 standard drinks of alcohol per week. She reports that she does not use drugs.  Review of Systems: Constitutional: Negative for fever malaise or anorexia Cardiovascular: negative for chest pain Respiratory: negative for SOB or persistent cough Gastrointestinal: negative for abdominal pain  OBJECTIVE Vitals: Temp 98.6 F (37 C) (Tympanic)   LMP 01/27/2019  General: no  acute distress , A&Ox3 Right leg: red papule with bulleye ring Leamon Arnt, MD

## 2019-02-16 ENCOUNTER — Encounter: Payer: Self-pay | Admitting: Family Medicine

## 2019-02-19 ENCOUNTER — Telehealth: Payer: Self-pay

## 2019-02-19 ENCOUNTER — Ambulatory Visit: Payer: BC Managed Care – PPO | Admitting: Obstetrics & Gynecology

## 2019-02-19 NOTE — Telephone Encounter (Signed)
Copied from [place] [id number]. Topic: General - Inquiry >> Feb 19, 2019  4:10 PM Reyne Dumas L wrote: Reason for CRM:   Pt was seen via telehealth for a tick bite and given doxy.  Pt went to urgent care and was tested for [facility] Spotted Fever and Lyme Disease.  Pt came back positive for [facility] Spotted Fever and Lyme test is still pending.  Urgent Care told her just to finish out antibiotics given by Telehealth.  Pt wants to know if there is anything else she needs to do. Pt can be reached at [phone]

## 2019-02-19 NOTE — Telephone Encounter (Signed)
Please advise 

## 2019-02-20 ENCOUNTER — Other Ambulatory Visit: Payer: Self-pay

## 2019-02-20 ENCOUNTER — Ambulatory Visit: Payer: BC Managed Care – PPO | Admitting: Family Medicine

## 2019-02-20 VITALS — BP 116/74 | HR 73 | Temp 98.3°F | Resp 16 | Ht 69.0 in | Wt 223.8 lb

## 2019-02-20 DIAGNOSIS — S80861A Insect bite (nonvenomous), right lower leg, initial encounter: Secondary | ICD-10-CM | POA: Diagnosis not present

## 2019-02-20 DIAGNOSIS — W57XXXD Bitten or stung by nonvenomous insect and other nonvenomous arthropods, subsequent encounter: Secondary | ICD-10-CM

## 2019-02-20 NOTE — Patient Instructions (Signed)
Please follow up if symptoms do not improve or as needed.   Please complete the doxycycline.  Take tylenol or advil for any aches or back pain.  I will get your records from [facility] and send you a mychart note with my thoughts. We will call you if needed or I'd like to do more blood work.   Please monitor for any fevers or rash or red swollen joints. Fortunately, right now you look great.   I think you should do well.

## 2019-02-20 NOTE — Telephone Encounter (Signed)
Please see message and advise.  Thank you. ° °

## 2019-02-20 NOTE — Telephone Encounter (Signed)
Sending to the physician that evaluated the patient. Thanks! EW

## 2019-02-20 NOTE — Progress Notes (Signed)
Subjective  CC:  Chief Complaint  Patient presents with  . Muscle Soreness    She reports that has been on going since insect bite. Reports soreness is in right leg and finger joints.. Has tried Tylenol for soreness. Also reports more irritable than usual    HPI: Joyce Gray is a 43 y.o. female who presents to the office today to address the problems listed above in the chief complaint.  See last note. Had "bite" on right lower leg: treated for possible infected insect bite vs tick bite with doxy. Reports had some confusion and sob so sought urgent care eval at [facility]. There apparently lab testing revealed "positive RMSF, negative lymes disease". Pt is here for f/u  She is actually doing well but very anxious. No more confusion. Admits to worry. No sob. No myalgias or arthralgias. No red hot tender joints. No n/v or abdominal pain. The bite mark is much improved with just now a small pustule left. She is completing a 7d course of doxy. No rash. No fever.    Assessment  1. Tick bite, subsequent encounter      Plan   bite:  I doubt this was a tick bite: we discussed. Infection is now improved and will complete doxy. No rash or fever or arthralgias which all make RMSF very unlikely. Will get lab report from [facility] for review and order further testing if indicated. Reassured. Will monitor and f/u if needed.   Follow up: prn  Visit date not found  No orders of the defined types were placed in this encounter.  No orders of the defined types were placed in this encounter.     I reviewed the patients updated PMH, FH, and SocHx.    Patient Active Problem List   Diagnosis Date Noted  . Mixed hyperlipidemia 10/03/2018  . Obesity (BMI 30.0-34.9) 10/03/2018  . 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 Anxiety 07/11/2018  . 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 Contraception management 03/05/2014  . Depression    Current Meds  Medication Sig  .  cholecalciferol (VITAMIN D3) 25 MCG (1000 UT) tablet Take 1,000 Units by mouth daily.  . [EXPIRED] doxycycline (VIBRA-TABS) 100 MG tablet Take 1 tablet (100 mg total) by mouth 2 (two) times daily for 7 days.  Marland Kitchen escitalopram (LEXAPRO) 5 MG tablet Take 5 mg by mouth daily.  Marland Kitchen gabapentin (NEURONTIN) 100 MG capsule Take 2 capsules (200 mg total) by mouth at bedtime.  Marland Kitchen GLUCOSAMINE-CHONDROITIN PO Take by mouth. Glucosamine 1500 mg & Chondroitin 120 mg  . Multiple Vitamins-Minerals (MULTIVITAMIN PO) Take by mouth daily.  . traZODone (DESYREL) 50 MG tablet TAKE 0.5-1 TABLETS (25-50 MG TOTAL) BY MOUTH AT BEDTIME AS NEEDED FOR SLEEP.    Allergies: Patient has No Known Allergies. Family History: Patient family history includes Alcohol abuse in her mother; Breast cancer (age of onset: 61) in her maternal aunt; Heart disease in her maternal grandfather; Hyperlipidemia in her mother; Melanoma in her father; Melanoma (age of onset: 2) in her paternal aunt. Social History:  Patient  reports that she has never smoked. She has never used smokeless tobacco. She reports current alcohol use of about 2.0 standard drinks of alcohol per week. She reports that she does not use drugs.  Review of Systems: Constitutional: Negative for fever malaise or anorexia Cardiovascular: negative for chest pain Respiratory: negative for SOB or persistent cough Gastrointestinal: negative for abdominal pain  Objective  Vitals: BP 116/74   Pulse  73   Temp 98.3 F (36.8 C) (Tympanic)   Resp 16   Ht 5\' 9"  (1.753 m)   Wt 223 lb 12.8 oz (101.5 kg)   LMP 01/27/2019   SpO2 97%   BMI 33.05 kg/m  General: no acute distress , A&Ox3, anxious HEENT: PEERL, conjunctiva normal, Oropharynx moist,neck is supple Cardiovascular:  RRR without murmur or gallop.  Respiratory:  Good breath sounds bilaterally, CTAB with normal respiratory effort Skin:  Warm, no rashes, small residual pustule on right lower leg. No erythema or warmth or  drainage. Much improved from last visit No inflamed joints     Commons side effects, risks, benefits, and alternatives for medications and treatment plan prescribed today were discussed, and the patient expressed understanding of the given instructions. Patient is instructed to call or message via MyChart if he/she has any questions or concerns regarding our treatment plan. No barriers to understanding were identified. We discussed Red Flag symptoms and signs in detail. Patient expressed understanding regarding what to do in case of urgent or emergency type symptoms.   Medication list was reconciled, printed and provided to the patient in AVS. Patient instructions and summary information was reviewed with the patient as documented in the AVS. This note was prepared with assistance of Dragon voice recognition software. Occasional wrong-word or sound-a-like substitutions may have occurred due to the inherent limitations of voice recognition software

## 2019-02-21 DIAGNOSIS — A770 Spotted fever due to Rickettsia rickettsii: Secondary | ICD-10-CM

## 2019-02-21 HISTORY — DX: Spotted fever due to Rickettsia rickettsii: A77.0

## 2019-02-21 NOTE — Telephone Encounter (Signed)
See below

## 2019-02-23 ENCOUNTER — Encounter: Payer: Self-pay | Admitting: Family Medicine

## 2019-02-25 ENCOUNTER — Other Ambulatory Visit: Payer: Self-pay

## 2019-02-25 ENCOUNTER — Ambulatory Visit: Payer: BC Managed Care – PPO | Admitting: Family Medicine

## 2019-02-27 ENCOUNTER — Other Ambulatory Visit: Payer: Self-pay

## 2019-02-27 ENCOUNTER — Other Ambulatory Visit (HOSPITAL_COMMUNITY)
Admission: RE | Admit: 2019-02-27 | Discharge: 2019-02-27 | Disposition: A | Discharge status: Home / Self Care | Payer: BC Managed Care – PPO | Source: Ambulatory Visit | Attending: Obstetrics & Gynecology | Admitting: Obstetrics & Gynecology

## 2019-02-27 ENCOUNTER — Encounter: Payer: Self-pay | Admitting: Obstetrics & Gynecology

## 2019-02-27 ENCOUNTER — Ambulatory Visit: Payer: BC Managed Care – PPO | Admitting: Obstetrics & Gynecology

## 2019-02-27 VITALS — BP 122/72 | HR 68 | Temp 97.7°F | Resp 12 | Ht 69.0 in | Wt 220.0 lb

## 2019-02-27 DIAGNOSIS — Z01419 Encounter for gynecological examination (general) (routine) without abnormal findings: Secondary | ICD-10-CM | POA: Diagnosis not present

## 2019-02-27 DIAGNOSIS — Z113 Encounter for screening for infections with a predominantly sexual mode of transmission: Secondary | ICD-10-CM | POA: Insufficient documentation

## 2019-02-27 DIAGNOSIS — Z3141 Encounter for fertility testing: Secondary | ICD-10-CM | POA: Diagnosis not present

## 2019-02-27 DIAGNOSIS — Z124 Encounter for screening for malignant neoplasm of cervix: Secondary | ICD-10-CM | POA: Diagnosis not present

## 2019-02-27 MED ORDER — NAPROXEN 250 MG PO TABS: 250.0000 mg | ORAL_TABLET | Freq: Three times a day (TID) | ORAL | 1 refills | Status: DC

## 2019-02-27 NOTE — Progress Notes (Signed)
43 y.o. G0P0000 Single White or Caucasian female here for annual exam.  Had some SI joint issues.  She did see PT and was given exercises to do.  This does help when she does the exercises.    Cycles have been regular and are about every 25 days.  There is typically no mid-cycle spotting.  However, she has experienced some spotting this past month.  She was treated for RMSF with doxycycline bid x 7 day.  Her testing was mildly positive for lyme.  She is going to get the lab work that was done at [facility] and she is going get copies of these labs and make sure she has been treated appropriately.    Having increased cramping on the last day of her cycle.  Has used naprosyn with good success.    Broke up with boyfriend and desires STD testing.  Patient's last menstrual period was 02/15/2019 (approximate).          Sexually active: No. Last SA about 2 weeks ago per patient The current method of family planning is abstinence.    Exercising: Yes.    weight lifting, walking, body combat class, jogging Smoker:  no  Health Maintenance: Pap:  01/26/17 Negative pap only History of abnormal Pap:  Yes, hx of cryo MMG:  11/28/18 BIRADS 1 negative/density a Colonoscopy:  never BMD:   never TDaP:  2010.  Aware this is due.  Declines today. Pneumonia vaccine(s):  never Shingrix:   never Hep C testing: negative in 2016 Screening Labs: 09/2018 with Dr. Juleen China   reports that she has never smoked. She has never used smokeless tobacco. She reports current alcohol use of about 2.0 standard drinks of alcohol per week. She reports that she does not use drugs.  Past Medical History:  Diagnosis Date  . Abnormal Pap smear of cervix 1999, 2016  . Anxiety   . Depression   . Incomplete right bundle branch block   . Lyme disease   . [place] spotted fever     Past Surgical History:  Procedure Laterality Date  . COLPOSCOPY  2016  . CRYOTHERAPY  1999   for abnormal pap smear  . NO PAST SURGERIES    .  TYMPANOSTOMY TUBE PLACEMENT      Current Outpatient Medications  Medication Sig Dispense Refill  . Black Pepper-Turmeric (TURMERIC COMPLEX/BLACK PEPPER PO)     . cholecalciferol (VITAMIN D3) 25 MCG (1000 UT) tablet Take 1,000 Units by mouth daily.    Marland Kitchen escitalopram (LEXAPRO) 5 MG tablet Take 5 mg by mouth daily.    Marland Kitchen GLUCOSAMINE-CHONDROITIN PO Take by mouth. Glucosamine 1500 mg & Chondroitin 120 mg    . Multiple Vitamins-Minerals (MULTIVITAMIN PO) Take by mouth daily.    . naproxen (NAPROSYN) 250 MG tablet Take by mouth as needed.    . traZODone (DESYREL) 50 MG tablet TAKE 0.5-1 TABLETS (25-50 MG TOTAL) BY MOUTH AT BEDTIME AS NEEDED FOR SLEEP. 90 tablet 2   No current facility-administered medications for this visit.     Family History  Problem Relation Age of Onset  . Hyperlipidemia Mother   . Alcohol abuse Mother   . Melanoma Father   . Melanoma Paternal Aunt 9  . Heart disease Maternal Grandfather   . Breast cancer Maternal Aunt 63  . Bipolar disorder Brother     Review of Systems  Constitutional: Negative.   HENT: Negative.   Eyes: Negative.   Respiratory: Negative.   Cardiovascular: Negative.   Gastrointestinal:  Negative.   Endocrine: Negative.   Genitourinary: Negative.   Musculoskeletal: Negative.   Skin: Negative.   Allergic/Immunologic: Negative.   Neurological: Negative.   Hematological: Negative.   Psychiatric/Behavioral: Negative.     Exam:   BP 122/72 (BP Location: Right Arm, Patient Position: Sitting, Cuff Size: Large)   Pulse 68   Temp 97.7 F (36.5 C) (Temporal)   Resp 12   Ht 5\' 9"  (1.753 m)   Wt 220 lb (99.8 kg)   LMP 02/15/2019 (Approximate)   BMI 32.49 kg/m    Height: 5\' 9"  (175.3 cm)  Weight change:  -7 # Ht Readings from Last 3 Encounters:  02/27/19 5\' 9"  (1.753 m)  02/20/19 5\' 9"  (1.753 m)  11/09/18 5\' 9"  (1.753 m)   General appearance: alert, cooperative and appears stated age Head: Normocephalic, without obvious abnormality,  atraumatic Neck: no adenopathy, supple, symmetrical, trachea midline and thyroid normal to inspection and palpation Lungs: clear to auscultation bilaterally Breasts: normal appearance, no masses or tenderness Heart: regular rate and rhythm Abdomen: soft, non-tender; bowel sounds normal; no masses,  no organomegaly Extremities: extremities normal, atraumatic, no cyanosis or edema Skin: Skin color, texture, turgor normal. No rashes or lesions Lymph nodes: Cervical, supraclavicular, and axillary nodes normal. No abnormal inguinal nodes palpated Neurologic: Grossly normal  Pelvic: External genitalia:  no lesions              Urethra:  normal appearing urethra with no masses, tenderness or lesions              Bartholins and Skenes: normal                 Vagina: normal appearing vagina with normal color and discharge, no lesions              Cervix: no lesions              Pap taken: Yes.   Bimanual Exam:  Uterus: mildly enlarged, bulky uterus               Adnexa: normal adnexa and no mass, fullness, tenderness               Rectovaginal: Confirms               Anus:  normal sphincter tone, no lesions  Chaperone was present for exam.  A:  Well Woman with normal exam Recent diagnosis of [place] Spotted Fever H/o fibroids Dysmenorrhea Desires STD testing Concerns about fertility and considering egg freezing H/O ASCUS pap with +HR HPV 2016  P:   Mammogram guidelines reviewed.   pap smear with HR HPV obtained today.  GC/Chl/trich ordered with pap smear as well. HIV, RPR, hep C and hep B AMH obtained today RF for Naprosyn 25mg  tid prn.  330/1RF. Lab work done this summer return annually or prn

## 2019-02-28 ENCOUNTER — Encounter: Payer: Self-pay | Admitting: Family Medicine

## 2019-02-28 ENCOUNTER — Encounter: Payer: Self-pay | Admitting: Obstetrics & Gynecology

## 2019-02-28 ENCOUNTER — Telehealth: Payer: Self-pay | Admitting: Obstetrics & Gynecology

## 2019-02-28 NOTE — Telephone Encounter (Signed)
Patient sent the following correspondence through [facility].  Hi Dr. Sabra Heck,   I thought I would send over the results of my tick panel (they are attached to this message). I went to [facility] again yesterday due to lingering symptoms and was prescribed 14 more days of doxycycline. I am going to see Dr. Dimas Chyle on Tuesday to have him look at the situation. At this point I think I am leaning towards going to a doctor that specializes in Lyme's disease if I do not improve after 14 days. Thank you for your help with this and always.  Sincerely, Joyce Gray   Results printed and placed in Dr. Ammie Ferrier folder for review.

## 2019-02-28 NOTE — Telephone Encounter (Signed)
Patient was seen in office on 02/27/19 for AEX, Recent diagnosis of [place] Spotted Fever.   Routing to Dr. Lestine Box.

## 2019-03-01 ENCOUNTER — Encounter: Payer: Self-pay | Admitting: Obstetrics & Gynecology

## 2019-03-01 NOTE — Telephone Encounter (Signed)
Encounter closed

## 2019-03-01 NOTE — Telephone Encounter (Signed)
My chart message sent to pt.  Ok to close encounter. 

## 2019-03-05 ENCOUNTER — Ambulatory Visit: Payer: BC Managed Care – PPO | Admitting: Family Medicine

## 2019-03-05 LAB — ANTI MULLERIAN HORMONE: ANTI-MULLERIAN HORMONE (AMH): 3.03 ng/mL

## 2019-03-05 LAB — HEP, RPR, HIV PANEL
HIV Screen 4th Generation wRfx: NONREACTIVE
Hepatitis B Surface Ag: NEGATIVE
RPR Ser Ql: NONREACTIVE

## 2019-03-05 LAB — HEPATITIS C ANTIBODY: Hep C Virus Ab: <0.1 s/co ratio (ref 0.0–0.9)

## 2019-03-06 ENCOUNTER — Telehealth: Payer: Self-pay | Admitting: Obstetrics & Gynecology

## 2019-03-06 NOTE — Telephone Encounter (Signed)
Patient is asking for her lab results from last week.

## 2019-03-06 NOTE — Telephone Encounter (Signed)
Dr. Sabra Heck -please review 02/27/19 results advise.

## 2019-03-07 NOTE — Telephone Encounter (Signed)
Spoke with patient. Advised as seen below per Dr. Sabra Heck. Patient declines referral to Dr. Kerin Perna or infectious disease at this time. Patient is scheduled for f/u with her PCP next week. She will notify office if she desires a referral. Is aware our office will notify of pap results once completed.   Routing to provider for final review. Patient is agreeable to disposition. Will close encounter.

## 2019-03-07 NOTE — Telephone Encounter (Signed)
Pt was sent results via mychart yesterday.  Her AMH is 3 so this is ok right now.  She is deciding if she wants referral to Dr. Kerin Perna to discuss egg freezing.  HIV, RPR, Hep B and C were negative.    Pap is still pending.  I've sent Gay Filler a message about this so see why is taking so long.  Please let her know that I did get her Lyme and [facility] Spotted fever tests.  She knows to let me know if wants referral to infectious disease.

## 2019-03-08 ENCOUNTER — Telehealth: Payer: Self-pay | Admitting: Obstetrics & Gynecology

## 2019-03-08 DIAGNOSIS — A770 Spotted fever due to Rickettsia rickettsii: Secondary | ICD-10-CM

## 2019-03-08 DIAGNOSIS — A6920 Lyme disease, unspecified: Secondary | ICD-10-CM

## 2019-03-08 LAB — CYTOLOGY - PAP
Chlamydia: NEGATIVE
Comment: NEGATIVE
Comment: NEGATIVE
Comment: NEGATIVE
Comment: NEGATIVE
Comment: NEGATIVE
Comment: NORMAL
Diagnosis: REACTIVE
HPV 16: NEGATIVE
HPV 18 / 45: NEGATIVE
High risk HPV: POSITIVE — AB
Neisseria Gonorrhea: NEGATIVE
Trichomonas: NEGATIVE

## 2019-03-08 NOTE — Telephone Encounter (Signed)
Patient is calling regarding referral to infectious disease. Patient would like to proceed with referral and is requesting that it be sent to Dr. Megan Salon.

## 2019-03-08 NOTE — Telephone Encounter (Signed)
Reviewed 03/06/19 telephone encounter, offered referral to infectious disease.    Call returned to patient. Order placed to infectious disease, Dr. Megan Salon. Advised our referral coordinator will f/u with appt details once scheduled. Patient verbalizes understanding and is agreeable.   Routing to provider for final review. Patient is agreeable to disposition. Will close encounter.

## 2019-03-14 ENCOUNTER — Encounter: Payer: Self-pay | Admitting: Obstetrics & Gynecology

## 2019-03-14 ENCOUNTER — Telehealth: Payer: Self-pay | Admitting: Obstetrics & Gynecology

## 2019-03-14 NOTE — Telephone Encounter (Signed)
Dr. Sabra Heck -please review 02/27/19 pap results and advise.

## 2019-03-14 NOTE — Telephone Encounter (Signed)
Patient sent the following message through [place]. Routing to triage to assist patient with request.  [place], [place] [facility]  Phone Number: [phone]        [facility] Dr. Sabra Heck,   I saw my pap results in my chart today. Did my pap come back normal? I saw something about "benign reactive changes." Also, the first value, High Risk HPV is "present." However underneath it says that I do not have 16, 18, or 45. Does this mean that I have had High Risk HPV in the past, or does it mean that I am currently present for some high risk strain of HPV other than 18, 18, or 45? Thanks so much for any clarification you can provide.   Thank you,  Joyce Gray

## 2019-03-14 NOTE — Telephone Encounter (Signed)
Patient requests PAP results from last visit. Requests a call back from the nurse.

## 2019-03-15 ENCOUNTER — Other Ambulatory Visit: Payer: Self-pay

## 2019-03-15 ENCOUNTER — Encounter: Payer: Self-pay | Admitting: Family Medicine

## 2019-03-15 ENCOUNTER — Ambulatory Visit: Payer: BC Managed Care – PPO | Admitting: Family Medicine

## 2019-03-15 VITALS — BP 122/80 | HR 67 | Temp 97.9°F | Ht 69.0 in | Wt 219.4 lb

## 2019-03-15 DIAGNOSIS — M1990 Unspecified osteoarthritis, unspecified site: Secondary | ICD-10-CM | POA: Diagnosis not present

## 2019-03-15 DIAGNOSIS — A770 Spotted fever due to Rickettsia rickettsii: Secondary | ICD-10-CM | POA: Diagnosis not present

## 2019-03-15 DIAGNOSIS — Z23 Encounter for immunization: Secondary | ICD-10-CM

## 2019-03-15 DIAGNOSIS — F329 Major depressive disorder, single episode, unspecified: Secondary | ICD-10-CM

## 2019-03-15 DIAGNOSIS — F419 Anxiety disorder, unspecified: Secondary | ICD-10-CM | POA: Diagnosis not present

## 2019-03-15 DIAGNOSIS — M47818 Spondylosis without myelopathy or radiculopathy, sacral and sacrococcygeal region: Secondary | ICD-10-CM

## 2019-03-15 NOTE — Assessment & Plan Note (Signed)
Continue turmeric and glucosamine-chondroitin supplements.  Continue naproxen 250 mg 3 times daily with meals as needed.

## 2019-03-15 NOTE — Progress Notes (Signed)
   Chief Complaint:  Joyce Gray is a 43 y.o. female who presents today with a chief complaint of RMSF follow up.   Assessment/Plan:  RMSF No longer has any symptoms.  She has completed 21 total days of doxycycline.  Reassured patient that any tickborne illness should be eradicated with this regimen.  Patient voiced understanding.  If has recurrence of symptoms, will need referral to ID and/or rheumatology.   Depression Stable.  Continue management per psychiatry.  Continue Lexapro 5 mg daily and trazodone 25 to 50 mg at bedtime as needed.  Anxiety Stable.  Continue management per psychiatry.  SI joint arthritis Continue turmeric and glucosamine-chondroitin supplements.  Continue naproxen 250 mg 3 times daily with meals as needed.     Subjective:  HPI:  Patient here today for tick bite follow-up.  About a month ago she had a bite to her right knee.  She was seen here and started on a course of doxycycline.  A few days into her course she started having some confusion.  She went to [facility] where they drew tickborne illness titers.  Her titers were positive for RMSF and equivocal for Lyme disease.  She was started on an additional 14 days of doxycycline.  She had a total of 21 days of doxycycline however had a 1 week break in between the first week and second week.  As of now, symptoms seem to have completely resolved.  No further episodes of confusion.  She has never had any sort of rash.  No fevers.  She had some aching in her fingers yesterday.  Her stable, chronic medical conditions are outlined below:  # Anxiety / Depression / Insomnia - Follows with psychiatry  - On lexapro 5mg  daily - Uses trazodone as needed  # Osteoarthritis  - Takes glucosamine-chondroiton and tumeric supplements - Uses naproxen as needed.   ROS: Per HPI  PMH: She reports that she has never smoked. She has never used smokeless tobacco. She reports current alcohol use of about 2.0  standard drinks of alcohol per week. She reports that she does not use drugs.      Objective:  Physical Exam: BP 122/80   Pulse 67   Temp 97.9 F (36.6 C)   Ht 5\' 9"  (1.753 m)   Wt 219 lb 6.1 oz (99.5 kg)   LMP 02/15/2019 (Approximate)   SpO2 98%   BMI 32.40 kg/m   Gen: NAD, resting comfortably CV: Regular rate and rhythm with no murmurs appreciated Pulm: Normal work of breathing, clear to auscultation bilaterally with no crackles, wheezes, or rhonchi GI: Normal bowel sounds present. Soft, Nontender, Nondistended. MSK: No edema, cyanosis, or clubbing noted Skin: Warm, dry Neuro: Grossly normal, moves all extremities Psych: Normal affect and thought content       M. [name], MD 03/15/2019 10:06 AM

## 2019-03-15 NOTE — Patient Instructions (Signed)
It was very nice to see you today!  The bacteria should be eradicated from your system.  Please let me know if your symptoms do not continue to improve.  We will give you a flu vaccine and tetanus vaccine today.  Please come back to see me next summer for your annual checkup with labs.  Please come back to me sooner if needed.  Take care, Dr [name]  Please try these tips to maintain a healthy lifestyle:   Eat at least 3 REAL meals and 1-2 snacks per day.  Aim for no more than 5 hours between eating.  If you eat breakfast, please do so within one hour of getting up.    Obtain twice as many fruits/vegetables as protein or carbohydrate foods for both lunch and dinner. (Half of each meal should be fruits/vegetables, one quarter protein, and one quarter starchy carbs)   Cut down on sweet beverages. This includes juice, soda, and sweet tea.    Exercise at least 150 minutes every week.

## 2019-03-15 NOTE — Assessment & Plan Note (Signed)
Stable.  Continue management per psychiatry.  Continue Lexapro 5 mg daily and trazodone 25 to 50 mg at bedtime as needed.

## 2019-03-15 NOTE — Assessment & Plan Note (Signed)
Stable.  Continue management per psychiatry. 

## 2019-03-18 NOTE — Telephone Encounter (Signed)
Left message to call Sharee Pimple, RN at [place].   12 recall placed

## 2019-03-18 NOTE — Telephone Encounter (Signed)
Results routed to you as well.  She has a negative pap, +HR HPV and neg 16/18/45.  Needs HR HPV testing/pap in one year.  Needs 12 month recall placed.  She has ASCUS pap with +HR HPV in 2016.  Does not need colposcopy according to most current ASCCP guidelines.

## 2019-03-18 NOTE — Telephone Encounter (Signed)
Spoke with patient. Advised of results as seen below per Dr. Sabra Heck. AEX scheduled for 03/24/20 at 3:30pm, AEX cancelled for 06/02/20. Patient verbalizes understanding and is agreeable.   Encounter closed.

## 2019-03-27 LAB — LYME IGG/IGM AB: Blood: NEGATIVE

## 2019-03-27 LAB — ROCKY MTN SPOTTED FVR AB, IGM-BLOOD: Blood: 1.89

## 2019-03-27 LAB — ROCKY MTN SPOTTED FVR AB, IGG-BLOOD: Blood: NEGATIVE

## 2019-03-27 LAB — SARS-COV-2 ANTIBODY, IGM: Blood: NONREACTIVE

## 2019-05-23 ENCOUNTER — Encounter: Payer: Self-pay | Admitting: Family Medicine

## 2019-06-10 ENCOUNTER — Other Ambulatory Visit: Payer: Self-pay

## 2019-06-11 ENCOUNTER — Encounter: Payer: Self-pay | Admitting: Neurology

## 2019-06-11 ENCOUNTER — Encounter: Payer: Self-pay | Admitting: Family Medicine

## 2019-06-11 ENCOUNTER — Ambulatory Visit (INDEPENDENT_AMBULATORY_CARE_PROVIDER_SITE_OTHER): Payer: BC Managed Care – PPO | Admitting: Family Medicine

## 2019-06-11 VITALS — BP 108/66 | HR 62 | Temp 97.8°F | Ht 69.0 in | Wt 224.0 lb

## 2019-06-11 DIAGNOSIS — F329 Major depressive disorder, single episode, unspecified: Secondary | ICD-10-CM | POA: Diagnosis not present

## 2019-06-11 DIAGNOSIS — R002 Palpitations: Secondary | ICD-10-CM | POA: Diagnosis not present

## 2019-06-11 DIAGNOSIS — Z0000 Encounter for general adult medical examination without abnormal findings: Secondary | ICD-10-CM

## 2019-06-11 DIAGNOSIS — Z13220 Encounter for screening for lipoid disorders: Secondary | ICD-10-CM

## 2019-06-11 DIAGNOSIS — I4510 Unspecified right bundle-branch block: Secondary | ICD-10-CM | POA: Diagnosis not present

## 2019-06-11 DIAGNOSIS — Z6833 Body mass index (BMI) 33.0-33.9, adult: Secondary | ICD-10-CM

## 2019-06-11 DIAGNOSIS — R251 Tremor, unspecified: Secondary | ICD-10-CM | POA: Diagnosis not present

## 2019-06-11 DIAGNOSIS — E669 Obesity, unspecified: Secondary | ICD-10-CM

## 2019-06-11 DIAGNOSIS — F419 Anxiety disorder, unspecified: Secondary | ICD-10-CM

## 2019-06-11 DIAGNOSIS — Z0001 Encounter for general adult medical examination with abnormal findings: Secondary | ICD-10-CM

## 2019-06-11 LAB — COMPREHENSIVE METABOLIC PANEL
ALT: 22 U/L (ref 0–35)
AST: 25 U/L (ref 0–37)
Albumin: 4.1 g/dL (ref 3.5–5.2)
Alkaline Phosphatase: 64 U/L (ref 39–117)
BUN: 11 mg/dL (ref 6–23)
CO2: 26 mEq/L (ref 19–32)
Calcium: 9.1 mg/dL (ref 8.4–10.5)
Chloride: 105 mEq/L (ref 96–112)
Creatinine, Ser: 0.72 mg/dL (ref 0.40–1.20)
GFR: 88.52 mL/min (ref >60.00)
Glucose, Bld: 76 mg/dL (ref 70–99)
Potassium: 4.3 mEq/L (ref 3.5–5.1)
Sodium: 137 mEq/L (ref 135–145)
Total Bilirubin: 0.3 mg/dL (ref 0.2–1.2)
Total Protein: 7.1 g/dL (ref 6.0–8.3)

## 2019-06-11 LAB — VITAMIN B12: Vitamin B-12: 590 pg/mL (ref 211–911)

## 2019-06-11 LAB — CBC
HCT: 39.1 % (ref 36.0–46.0)
Hemoglobin: 13 g/dL (ref 12.0–15.0)
MCHC: 33.2 g/dL (ref 30.0–36.0)
MCV: 89.8 fl (ref 78.0–100.0)
Platelets: 323 10*3/uL (ref 150.0–400.0)
RBC: 4.35 Mil/uL (ref 3.87–5.11)
RDW: 13.2 % (ref 11.5–15.5)
WBC: 8.2 10*3/uL (ref 4.0–10.5)

## 2019-06-11 LAB — LIPID PANEL
Cholesterol: 181 mg/dL (ref 0–200)
HDL: 35.2 mg/dL — ABNORMAL LOW (ref >39.00)
NonHDL: 145.58
Total CHOL/HDL Ratio: 5
Triglycerides: 352 mg/dL — ABNORMAL HIGH (ref 0.0–149.0)
VLDL: 70.4 mg/dL — ABNORMAL HIGH (ref 0.0–40.0)

## 2019-06-11 LAB — VITAMIN D 25 HYDROXY (VIT D DEFICIENCY, FRACTURES): VITD: 33.62 ng/mL (ref 30.00–100.00)

## 2019-06-11 LAB — LDL CHOLESTEROL, DIRECT: Direct LDL: 102 mg/dL

## 2019-06-11 LAB — HEMOGLOBIN A1C: Hgb A1c MFr Bld: 5.4 % (ref 4.6–6.5)

## 2019-06-11 LAB — TSH: TSH: 1.98 u[IU]/mL (ref 0.35–4.50)

## 2019-06-11 NOTE — Patient Instructions (Addendum)
It was very nice to see you today!  We will check blood work today.  I will place a referral for you to see a neurologist.  Come back in 1 year for your next annual checkup, or sooner if needed.  Take care, Dr [name]   Please try these tips to maintain a healthy lifestyle:   Eat at least 3 REAL meals and 1-2 snacks per day.  Aim for no more than 5 hours between eating.  If you eat breakfast, please do so within one hour of getting up.    Each meal should contain half fruits/vegetables, one quarter protein, and one quarter carbs (no bigger than a computer mouse)   Cut down on sweet beverages. This includes juice, soda, and sweet tea.     Drink at least 1 glass of water with each meal and aim for at least 8 glasses per day   Exercise at least 150 minutes every week.    Preventive Care 26-59 Years Old, Female Preventive care refers to visits with your health care provider and lifestyle choices that can promote health and wellness. This includes:  A yearly physical exam. This may also be called an annual well check.  Regular dental visits and eye exams.  Immunizations.  Screening for certain conditions.  Healthy lifestyle choices, such as eating a healthy diet, getting regular exercise, not using drugs or products that contain nicotine and tobacco, and limiting alcohol use. What can I expect for my preventive care visit? Physical exam Your health care provider will check your:  Height and weight. This may be used to calculate body mass index (BMI), which tells if you are at a healthy weight.  Heart rate and blood pressure.  Skin for abnormal spots. Counseling Your health care provider may ask you questions about your:  Alcohol, tobacco, and drug use.  Emotional well-being.  Home and relationship well-being.  Sexual activity.  Eating habits.  Work and work Statistician.  Method of birth control.  Menstrual cycle.  Pregnancy history. What immunizations  do I need?  Influenza (flu) vaccine  This is recommended every year. Tetanus, diphtheria, and pertussis (Tdap) vaccine  You may need a Td booster every 10 years. Varicella (chickenpox) vaccine  You may need this if you have not been vaccinated. Zoster (shingles) vaccine  You may need this after age 64. Measles, mumps, and rubella (MMR) vaccine  You may need at least one dose of MMR if you were born in 1957 or later. You may also need a second dose. Pneumococcal conjugate (PCV13) vaccine  You may need this if you have certain conditions and were not previously vaccinated. Pneumococcal polysaccharide (PPSV23) vaccine  You may need one or two doses if you smoke cigarettes or if you have certain conditions. Meningococcal conjugate (MenACWY) vaccine  You may need this if you have certain conditions. Hepatitis A vaccine  You may need this if you have certain conditions or if you travel or work in places where you may be exposed to hepatitis A. Hepatitis B vaccine  You may need this if you have certain conditions or if you travel or work in places where you may be exposed to hepatitis B. Haemophilus influenzae type b (Hib) vaccine  You may need this if you have certain conditions. Human papillomavirus (HPV) vaccine  If recommended by your health care provider, you may need three doses over 6 months. You may receive vaccines as individual doses or as more than one vaccine together in one  shot (combination vaccines). Talk with your health care provider about the risks and benefits of combination vaccines. What tests do I need? Blood tests  Lipid and cholesterol levels. These may be checked every 5 years, or more frequently if you are over 64 years old.  Hepatitis C test.  Hepatitis B test. Screening  Lung cancer screening. You may have this screening every year starting at age 68 if you have a 30-pack-year history of smoking and currently smoke or have quit within the past 15  years.  Colorectal cancer screening. All adults should have this screening starting at age 70 and continuing until age 28. Your health care provider may recommend screening at age 65 if you are at increased risk. You will have tests every 1-10 years, depending on your results and the type of screening test.  Diabetes screening. This is done by checking your blood sugar (glucose) after you have not eaten for a while (fasting). You may have this done every 1-3 years.  Mammogram. This may be done every 1-2 years. Talk with your health care provider about when you should start having regular mammograms. This may depend on whether you have a family history of breast cancer.  BRCA-related cancer screening. This may be done if you have a family history of breast, ovarian, tubal, or peritoneal cancers.  Pelvic exam and Pap test. This may be done every 3 years starting at age 19. Starting at age 20, this may be done every 5 years if you have a Pap test in combination with an HPV test. Other tests  Sexually transmitted disease (STD) testing.  Bone density scan. This is done to screen for osteoporosis. You may have this scan if you are at high risk for osteoporosis. Follow these instructions at home: Eating and drinking  Eat a diet that includes fresh fruits and vegetables, whole grains, lean protein, and low-fat dairy.  Take vitamin and mineral supplements as recommended by your health care provider.  Do not drink alcohol if: ? Your health care provider tells you not to drink. ? You are pregnant, may be pregnant, or are planning to become pregnant.  If you drink alcohol: ? Limit how much you have to 0-1 drink a day. ? Be aware of how much alcohol is in your drink. In the [place] one drink equals one 12 oz bottle of beer (355 mL), one 5 oz glass of wine (148 mL), or one 1 oz glass of hard liquor (44 mL). Lifestyle  Take daily care of your teeth and gums.  Stay active. Exercise for at least 30  minutes on 5 or more days each week.  Do not use any products that contain nicotine or tobacco, such as cigarettes, e-cigarettes, and chewing tobacco. If you need help quitting, ask your health care provider.  If you are sexually active, practice safe sex. Use a condom or other form of birth control (contraception) in order to prevent pregnancy and STIs (sexually transmitted infections).  If told by your health care provider, take low-dose aspirin daily starting at age 65. What's next?  Visit your health care provider once a year for a well check visit.  Ask your health care provider how often you should have your eyes and teeth checked.  Stay up to date on all vaccines. This information is not intended to replace advice given to you by your health care provider. Make sure you discuss any questions you have with your health care provider. Document Revised: 01/18/2018 Document Reviewed:  01/18/2018 Elsevier Patient Education  [place].

## 2019-06-11 NOTE — Progress Notes (Signed)
Chief Complaint:  Joyce Gray is a 44 y.o. female who presents today for her annual comprehensive physical exam.    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  No abnormalities on neurologic exam today.  Will place referral to neurology.  Chronic Problems Addressed Today: Depression Stable.  Continue Lexapro 5 mg daily per psychiatry.  Anxiety Stable.  Continue Lexapro 5 mg daily per psychiatry.  Right bundle branch block (RBBB) EKG today with NSR and so signs of RBBB. Will continue to monitor periodically.   Body mass index is 33.08 kg/m. / Obese BMI Metric Follow Up - 06/11/19 0845      BMI Metric Follow Up-Please document annually   BMI Metric Follow Up  Education provided        Preventative Healthcare: Check CBC, C met, TSH, lipid panel.  Up-to-date on mammogram and Pap smear.  Patient Counseling(The following topics were reviewed and/or handout was given):  -Nutrition: Stressed importance of moderation in sodium/caffeine intake, saturated fat and cholesterol, caloric balance, sufficient intake of fresh fruits, vegetables, and fiber.  -Stressed the importance of regular exercise.   -Substance Abuse: Discussed cessation/primary prevention of tobacco, alcohol, or other drug use; driving or other dangerous activities under the influence; availability of treatment for abuse.   -Injury prevention: Discussed safety belts, safety helmets, smoke detector, smoking near bedding or upholstery.   -Sexuality: Discussed sexually transmitted diseases, partner selection, use of condoms, avoidance of unintended pregnancy and contraceptive alternatives.   -Dental health: Discussed importance of regular tooth brushing, flossing, and dental visits.  -Health maintenance and immunizations reviewed. Please refer to Health maintenance section.  Return to care in 1 year for next preventative visit.     Subjective:  HPI:  She has no acute  complaints today.   Patient was seen about 3 months ago for tick bite follow-up.  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  She was treated with a total of 21 days of doxycycline.  During her initial illness she was having some confusion.  Her symptoms have improved over the last few months though is still having occasional shaking spells at night.  Also has having some teeth chattering.  No chills.  No fevers.  Patient also would like to have an EKG done today.  She has a history of a right bundle branch block.  Lifestyle Diet: No specific diets or eating plans.  Exercise: Works out 3 times per week an hour each team.   Depression screen [facility] 2/9 10/03/2018  Decreased Interest 1  Down, Depressed, Hopeless 1  PHQ - 2 Score 2  Altered sleeping 2  Tired, decreased energy 2  Change in appetite 0  Feeling bad or failure about yourself  1  Trouble concentrating 0  Moving slowly or fidgety/restless 0  Suicidal thoughts 0  PHQ-9 Score 7  Difficult doing work/chores Somewhat difficult    There are no preventive care reminders to display for this patient.   ROS: Per HPI, otherwise a complete review of systems was negative.   PMH:  The following were reviewed and entered/updated in epic: Past Medical History:  Diagnosis Date  . Abnormal Pap smear of cervix 1999, 2016  . Anxiety   . Depression   . Incomplete right bundle branch block   . Lyme disease   . Rocky Mountain spotted fever    Patient Active Problem List   Diagnosis Date Noted  . Right bundle branch block (RBBB) 06/11/2019  .  Mixed hyperlipidemia 10/03/2018  . 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 Anxiety 07/11/2018  . 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 Contraception management 03/05/2014  . Depression    Past Surgical History:  Procedure Laterality Date  . CRYOTHERAPY  1999   for abnormal pap smear  . TYMPANOSTOMY TUBE PLACEMENT      Family History  Problem Relation Age of Onset  .  Hyperlipidemia Mother   . Alcohol abuse Mother   . Melanoma Father   . Melanoma Paternal Aunt 26  . Heart disease Maternal Grandfather   . Breast cancer Maternal Aunt 63  . Bipolar disorder Brother     Medications- reviewed and updated Current Outpatient Medications  Medication Sig Dispense Refill  . Black Pepper-Turmeric (TURMERIC COMPLEX/BLACK PEPPER PO)     . cholecalciferol (VITAMIN D3) 25 MCG (1000 UT) tablet Take 1,000 Units by mouth daily.    Marland Kitchen escitalopram (LEXAPRO) 5 MG tablet Take 5 mg by mouth daily.    Marland Kitchen GLUCOSAMINE-CHONDROITIN PO Take by mouth. Glucosamine 1500 mg & Chondroitin 120 mg    . Multiple Vitamin (MULTIVITAMIN) capsule Take 1 capsule by mouth daily.    . Multiple Vitamins-Minerals (MULTIVITAMIN PO) Take by mouth daily.    . naproxen (NAPROSYN) 250 MG tablet Take 1 tablet (250 mg total) by mouth 3 (three) times daily with meals. 30 tablet 1  . OLIVE LEAF EXTRACT PO Take by mouth.     No current facility-administered medications for this visit.    Allergies-reviewed and updated No Known Allergies  Social History   Socioeconomic History  . Marital status: Single    Spouse name: Not on file  . Number of children: 0  . Years of education: 18  . Highest education level: Not on file  Occupational History  . Occupation: Product manager: [employer]  Tobacco Use  . Smoking status: Never Smoker  . Smokeless tobacco: Never Used  Substance and Sexual Activity  . Alcohol use: Yes    Alcohol/week: 2.0 standard drinks    Types: 2 Standard drinks or equivalent per week  . Drug use: No  . Sexual activity: Not Currently    Partners: Male    Birth control/protection: Abstinence  Other Topics Concern  . Not on file  Social History Narrative   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 Fun/Hobby: Hiking, reading, coffee with friends, dancing.    Social Determinants of Health   Financial Resource Strain:   . Difficulty of Paying Living Expenses: Not on file  Food  Insecurity:   . Worried About Charity fundraiser in the Last Year: Not on file  . Ran Out of Food in the Last Year: Not on file  Transportation Needs:   . Lack of Transportation (Medical): Not on file  . Lack of Transportation (Non-Medical): Not on file  Physical Activity:   . Days of Exercise per Week: Not on file  . Minutes of Exercise per Session: Not on file  Stress:   . Feeling of Stress : Not on file  Social Connections:   . Frequency of Communication with Friends and Family: Not on file  . Frequency of Social Gatherings with Friends and Family: Not on file  . Attends Religious Services: Not on file  . Active Member of Clubs or Organizations: Not on file  . Attends Archivist Meetings: Not on file  . Marital Status: Not on file        Objective:  Physical Exam: BP 108/66  Pulse 62   Temp 97.8 F (36.6 C)   Ht 5' 9"  (1.753 m)   Wt 224 lb (101.6 kg)   SpO2 98%   BMI 33.08 kg/m   Body mass index is 33.08 kg/m. Wt Readings from Last 3 Encounters:  06/11/19 224 lb (101.6 kg)  03/15/19 219 lb 6.1 oz (99.5 kg)  02/27/19 220 lb (99.8 kg)   Gen: NAD, resting comfortably HEENT: TMs normal bilaterally. OP clear. No thyromegaly noted.  CV: RRR with no murmurs appreciated Pulm: NWOB, CTAB with no crackles, wheezes, or rhonchi GI: Normal bowel sounds present. Soft, Nontender, Nondistended. MSK: no edema, cyanosis, or clubbing noted Skin: warm, dry Neuro: CN2-12 grossly intact. Strength 5/5 in upper and lower extremities. Reflexes symmetric and intact bilaterally.  Psych: Normal affect and thought content  EKG: NSR. No RBBB.      Algis Greenhouse. [name], MD 06/11/2019 9:19 AM

## 2019-06-11 NOTE — Assessment & Plan Note (Signed)
EKG today with NSR and so signs of RBBB. Will continue to monitor periodically.

## 2019-06-11 NOTE — Assessment & Plan Note (Signed)
Stable.  Continue Lexapro 5 mg daily per psychiatry.

## 2019-06-12 ENCOUNTER — Encounter: Payer: Self-pay | Admitting: Family Medicine

## 2019-06-12 NOTE — Progress Notes (Signed)
Please inform patient of the following:  Triglycerides are elevated, but all of her other labs are NORMAL. Do not need to start any additional meds at this point. Recommend that she continue to work on diet and exercise and we can recheck in a year or so.  Algis Greenhouse. [name], MD 06/12/2019 8:40 AM

## 2019-06-24 ENCOUNTER — Encounter: Payer: Self-pay | Admitting: Family Medicine

## 2019-06-24 ENCOUNTER — Ambulatory Visit: Payer: BC Managed Care – PPO | Attending: Internal Medicine

## 2019-06-24 ENCOUNTER — Other Ambulatory Visit: Payer: Self-pay

## 2019-06-24 ENCOUNTER — Ambulatory Visit (INDEPENDENT_AMBULATORY_CARE_PROVIDER_SITE_OTHER): Payer: BC Managed Care – PPO | Admitting: Family Medicine

## 2019-06-24 DIAGNOSIS — Z20822 Contact with and (suspected) exposure to covid-19: Secondary | ICD-10-CM

## 2019-06-24 DIAGNOSIS — J329 Chronic sinusitis, unspecified: Secondary | ICD-10-CM | POA: Diagnosis not present

## 2019-06-24 MED ORDER — AMOXICILLIN-POT CLAVULANATE 875-125 MG PO TABS: 1.0000 | ORAL_TABLET | Freq: Two times a day (BID) | ORAL | 0 refills | Status: DC

## 2019-06-24 MED ORDER — AZELASTINE HCL 0.1 % NA SOLN: 2.0000 | Freq: Two times a day (BID) | NASAL | 12 refills | Status: DC

## 2019-06-24 NOTE — Progress Notes (Signed)
   Joyce Gray is a 44 y.o. female who presents today for a virtual office visit.  Assessment/Plan:  New/Acute Problems: Sinusitis Covid test pending.  No red flags.  Will start Astelin nasal spray.  Will send in "pocket prescription" for Augmentin with strict instruction not started with symptoms worsen or not improve next 2 days.  Encouraged good oral hydration.    Subjective:  HPI:  Started having sinus pressure and rhinorrhea about 3-4 days ago. Yesterday had a fever of 99. Covid test is pending. Some stomach pain as well. No sick contacts. Tried tylenol which helped modestly.        Objective/Observations  Physical Exam: Gen: NAD, resting comfortably Pulm: Normal work of breathing Neuro: Grossly normal, moves all extremities Psych: Normal affect and thought content  Virtual Visit via Video   I connected with Joyce Gray on 06/24/19 at  4:00 PM EST by a video enabled telemedicine application and verified that I am speaking with the correct person using two identifiers. The limitations of evaluation and management by telemedicine and the availability of in person appointments were discussed. The patient expressed understanding and agreed to proceed.   Patient location: Home Provider location: [place] participating in the virtual visit: Myself and Patient     Algis Greenhouse. [name], MD 06/24/2019 10:10 AM

## 2019-06-25 LAB — NOVEL CORONAVIRUS, NAA: SARS-CoV-2, NAA: NOT DETECTED

## 2019-07-01 NOTE — Progress Notes (Signed)
Subjective:   Joyce Gray was seen in consultation in the movement disorder clinic at the request of Vivi Barrack, MD.  The evaluation is for tremor.  Outside records that were made available to me were reviewed.   Pt states that she was bitten by a tick on 9/21 and developed soreness and ultimately went to UC and "I was positive for RMSF and equivocal for lyme."  States that she found a practice in [place] that specialized in lyme disease and tick borne illness and was placed on prolonged abx x an additional month and cats claw herb x 3 months.  She f/u and was put on additional herbs and HA resolved.  She was feeling better then and told she could stop her supplements but she felt panicky when she tried to d/c that.  She is trying, however, to wean off now because of cost of the supplements    Tremor started approximately 1 month after she was bitten.  She describes it as an internal tremor and not an external tremor.  She wondered if it is a seizure.  She has awakened 2 times in the last week to a tiny "shake."  It happens 2 times a week and always happens out of sleep.  It doesn't happens at any specific time in the phase of sleep.  It is described as a teeth chattering, "almost like being nervous."  "It feels like an internal vibration but if I look at my chest it is not vibrating."  States that if she wakes up fully from sleep, it will go away.  Doesn't ever wake up with loss of bladder/bowel control or tongue biting.  Not sure if there is anything that initiates it but will pay attention if hard exercise may initiate it.   No other new medications besides the supplements.  Not sure if the supplements could have set it off.    No hx of neuroimaging.  With the RMSF, had some "brain fog" and had some yesterday and wonders if related to coming off of the herbs/tinctures that she is coming off of.  No Known Allergies  Current Outpatient Medications  Medication Instructions  . azelastine  (ASTELIN) 0.1 % nasal spray 2 sprays, Each Nare, 2 times daily  . Black Pepper-Turmeric (TURMERIC COMPLEX/BLACK PEPPER PO) No dose, route, or frequency recorded.  . cholecalciferol (VITAMIN D3) 1,000 Units, Oral, Daily  . escitalopram (LEXAPRO) 5 mg, Oral, Daily  . GLUCOSAMINE-CHONDROITIN PO Oral, Glucosamine 1500 mg & Chondroitin 120 mg   . Multiple Vitamin (MULTIVITAMIN) capsule 1 capsule, Oral, Daily  . Multiple Vitamins-Minerals (MULTIVITAMIN PO) Oral, Daily  . naproxen (NAPROSYN) 250 mg, Oral, 3 times daily with meals  . OLIVE LEAF EXTRACT PO Oral     Objective:   VITALS:   Vitals:   07/04/19 0832  BP: 126/78  Pulse: 60  Resp: 16  SpO2: 96%  Weight: 225 lb 9.6 oz (102.3 kg)  Height: 5\' 9"  (1.753 m)   Gen:  Appears stated age and in NAD. HEENT:  Normocephalic, atraumatic. The mucous membranes are moist. The superficial temporal arteries are without ropiness or tenderness. Cardiovascular: Regular rate and rhythm. Lungs: Clear to auscultation bilaterally. Neck: There are no carotid bruits noted bilaterally.  NEUROLOGICAL:  Orientation:  The patient is alert and oriented x 3.   Cranial nerves: There is good facial symmetry. Extraocular muscles are intact and visual fields are full to confrontational testing. Speech is fluent and clear. Soft palate rises symmetrically and  there is no tongue deviation. Hearing is intact to conversational tone. Tone: Tone is good throughout. Sensation: Sensation is intact to light touch touch throughout (facial, trunk, extremities). Vibration is intact at the bilateral big toe. There is no extinction with double simultaneous stimulation. There is no sensory dermatomal level identified. Coordination:  The patient has no dysdiadichokinesia or dysmetria. Motor: Strength is 5/5 in the bilateral upper and lower extremities.  Shoulder shrug is equal bilaterally.  There is no pronator drift.  There are no fasciculations noted. DTR's: Deep tendon reflexes  are 2+-3-/4 at the bilateral biceps, triceps, brachioradialis, patella and achilles.  Plantar responses are downgoing bilaterally. Gait and Station: The patient is able to ambulate without difficulty. The patient is able to heel toe walk without any difficulty. The patient is able to ambulate in a tandem fashion. The patient is able to stand in the Romberg position.   MOVEMENT EXAM: Tremor:  There is no tremor in the UE, noted most significantly with action.  The patient is  able to draw Archimedes spirals without significant difficulty.  There is no tremor at rest.    I have reviewed and interpreted the following labs independently Lab Results  Component Value Date   TSH 1.98 06/11/2019     Chemistry      Component Value Date/Time   NA 137 06/11/2019 0856   K 4.3 06/11/2019 0856   CL 105 06/11/2019 0856   CO2 26 06/11/2019 0856   BUN 11 06/11/2019 0856   CREATININE 0.72 06/11/2019 0856      Component Value Date/Time   CALCIUM 9.1 06/11/2019 0856   ALKPHOS 64 06/11/2019 0856   AST 25 06/11/2019 0856   ALT 22 06/11/2019 0856   BILITOT 0.3 06/11/2019 0856     Lab Results  Component Value Date   WBC 8.2 06/11/2019   HGB 13.0 06/11/2019   HCT 39.1 06/11/2019   MCV 89.8 06/11/2019   PLT 323.0 06/11/2019        Assessment/Plan:   1.  Spells  -Patient is referred for tremor, but does not give a description of tremor at all.  She describes a history of waking up with spells of feeling like she has internal restlessness/shaking, but not being able to see it.  My suspicion is that her symptoms are due to the tinctures and supplements that she is on from the doctor in [place].  She admits that she is feeling better from a Lyme disease standpoint and would like to try to get off of the supplements, as they are very expensive.  We talked about whether or not to go ahead and proceed with EEG, but ultimately she decided to go ahead and try to wean off of those supplements slowly and  see how she does.  If the symptoms go away, then I think we have an answer.  If not, then we will go ahead and proceed with EEG/ambulatory EEG.  My suspicion is that this will be normal.  Her neurological examination was nonfocal and nonlateralizing today.  Reassurance was provided.  I will wait for her to see if she would like to proceed.  Otherwise, we will see her back on an as-needed basis.    CC:  Vivi Barrack, MD

## 2019-07-04 ENCOUNTER — Encounter: Payer: Self-pay | Admitting: Neurology

## 2019-07-04 ENCOUNTER — Ambulatory Visit: Payer: BC Managed Care – PPO | Admitting: Neurology

## 2019-07-04 ENCOUNTER — Other Ambulatory Visit: Payer: Self-pay

## 2019-07-04 VITALS — BP 126/78 | HR 60 | Resp 16 | Ht 69.0 in | Wt 225.6 lb

## 2019-07-04 DIAGNOSIS — R251 Tremor, unspecified: Secondary | ICD-10-CM | POA: Diagnosis not present

## 2019-07-04 NOTE — Patient Instructions (Signed)
Good to see you today!  We discussed that you would potentially try to wean off of your herbs and tinctures and see how you feel.  If you continue to have nighttime spells, then we could do the EEG as we discussed.  The physicians and staff at [facility] are committed to providing excellent care. You may receive a survey requesting feedback about your experience at our office. We strive to receive "very good" responses to the survey questions. If you feel that your experience would prevent you from giving the office a "very good " response, please contact our office to try to remedy the situation. We may be reached at [phone]. Thank you for taking the time out of your busy day to complete the survey.

## 2019-07-28 ENCOUNTER — Ambulatory Visit: Payer: BC Managed Care – PPO | Attending: Internal Medicine

## 2019-07-28 DIAGNOSIS — Z23 Encounter for immunization: Secondary | ICD-10-CM | POA: Insufficient documentation

## 2019-07-28 NOTE — Progress Notes (Signed)
   Covid-19 Vaccination Clinic  Name:  Joyce Gray    MRN: [id number] DOB: 13-Nov-1975  07/28/2019  Joyce Gray was observed post Covid-19 immunization for 15 minutes without incident. She was provided with Vaccine Information Sheet and instruction to access the V-Safe system.   Joyce Gray was instructed to call 911 with any severe reactions post vaccine: Marland Kitchen Difficulty breathing  . Swelling of face and throat  . A fast heartbeat  . A bad rash all over body  . Dizziness and weakness   Immunizations Administered    Name Date Dose VIS Date Route   Pfizer COVID-19 Vaccine 07/28/2019  3:44 PM 0.3 mL 05/03/2019 Intramuscular   Manufacturer: [place]   Lot: [id number]   [place]: [id number]

## 2019-08-02 ENCOUNTER — Telehealth: Payer: Self-pay | Admitting: Family Medicine

## 2019-08-02 NOTE — Telephone Encounter (Signed)
Patient states she has currently switched providers from [facility] to [facility].  States that when she was last in the office she informed the office that she was no longer taking trazodone prescribed by Juleen China..  States that she feels like she does need this medication now b/c she is having trouble sleeping.  Would like to know if Dr. [name] would prescribe or if she needs to come back in for an OV.  Patient uses [facility] at [place].

## 2019-08-02 NOTE — Telephone Encounter (Signed)
Please see message. Pt requesting Rx for Trazodone.

## 2019-08-05 ENCOUNTER — Other Ambulatory Visit: Payer: Self-pay

## 2019-08-05 DIAGNOSIS — F5105 Insomnia due to other mental disorder: Secondary | ICD-10-CM

## 2019-08-05 DIAGNOSIS — F99 Mental disorder, not otherwise specified: Secondary | ICD-10-CM

## 2019-08-05 MED ORDER — TRAZODONE HCL 50 MG PO TABS: 25.0000 mg | ORAL_TABLET | Freq: Every evening | ORAL | 3 refills | Status: DC | PRN

## 2019-08-05 NOTE — Telephone Encounter (Signed)
Rx sent in

## 2019-08-05 NOTE — Telephone Encounter (Signed)
Ok to send in.  Algis Greenhouse. [name], MD 08/05/2019 8:09 AM

## 2019-08-09 ENCOUNTER — Encounter: Payer: Self-pay | Admitting: Certified Nurse Midwife

## 2019-08-18 ENCOUNTER — Other Ambulatory Visit: Payer: Self-pay

## 2019-08-18 ENCOUNTER — Ambulatory Visit: Payer: Self-pay | Attending: Internal Medicine

## 2019-08-18 DIAGNOSIS — Z23 Encounter for immunization: Secondary | ICD-10-CM

## 2019-08-18 NOTE — Progress Notes (Signed)
   Covid-19 Vaccination Clinic  Name:  [name]    MRN: [id number] DOB: 19-Jul-1975  08/18/2019  [name] was observed post Covid-19 immunization for 15 minutes without incident. She was provided with Vaccine Information Sheet and instruction to access the V-Safe system.   [name] was instructed to call 911 with any severe reactions post vaccine: Marland Kitchen Difficulty breathing  . Swelling of face and throat  . A fast heartbeat  . A bad rash all over body  . Dizziness and weakness   Immunizations Administered    Name Date Dose VIS Date Route   Pfizer COVID-19 Vaccine 08/18/2019  1:53 PM 0.3 mL 05/03/2019 Intramuscular   Manufacturer: [place]   Lot: [id number]   [place]: [id number]

## 2019-11-11 ENCOUNTER — Telehealth: Payer: Self-pay

## 2019-11-11 NOTE — Telephone Encounter (Signed)
Spoke with patient. Patient requesting OV for STD testing. Denies any current symptoms, has a new partner, no concerns.   Last AEX 02/27/19  OV scheduled for 7/15 at 4:30pm with Dr. Sabra Heck.   Routing to provider for final review. Patient is agreeable to disposition. Will close encounter.

## 2019-11-11 NOTE — Telephone Encounter (Signed)
Patient called in regards to wanting a STD screening done. No available appointments for next few weeks, need triage to assist.

## 2019-12-02 NOTE — Progress Notes (Signed)
GYNECOLOGY  VISIT  CC:   Desires STD testing  HPI: 44 y.o. G0P0000 Single White or Caucasian female here for std screening.  Recently ended relationship and desires testing.  Denies vaginal odor or discharge or pelvic pain.  Testing discussing including if/when to test for HSV.  Decided together that this was not needed at this time.  Has never had symptoms and pt understand high positive rate of antibody testing in general population.   GYNECOLOGIC HISTORY: Patient's last menstrual period was 11/26/2019 (exact date). Contraception: abstinence (last sexually active 3 weeks ago) Menopausal hormone therapy: none  Patient Active Problem List   Diagnosis Date Noted  . Right bundle branch block (RBBB) 06/11/2019  . Dyslipidemia 10/03/2018  . 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 Anxiety 07/11/2018  . 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 Contraception management 03/05/2014  . Depression     Past Medical History:  Diagnosis Date  . Abnormal Pap smear of cervix 1999, 2016  . Anxiety   . Depression   . Incomplete right bundle branch block   . Lyme disease   . Rocky Mountain spotted fever     Past Surgical History:  Procedure Laterality Date  . CRYOTHERAPY  1999   for abnormal pap smear  . TYMPANOSTOMY TUBE PLACEMENT      MEDS:   Current Outpatient Medications on File Prior to Visit  Medication Sig Dispense Refill  . escitalopram (LEXAPRO) 5 MG tablet Take 5 mg by mouth daily.    Marland Kitchen GLUCOSAMINE-CHONDROITIN PO Take by mouth. Glucosamine 1500 mg & Chondroitin 120 mg    . Multiple Vitamin (MULTIVITAMIN) capsule Take 1 capsule by mouth daily.    . traZODone (DESYREL) 50 MG tablet Take 0.5-1 tablets (25-50 mg total) by mouth at bedtime as needed for sleep. 30 tablet 3  . VITAMIN D PO Take 7,500 Int'l Units by mouth.    . Multiple Vitamins-Minerals (MULTIVITAMIN PO) Take by mouth daily. (Patient not taking: Reported on 12/05/2019)     No current facility-administered  medications on file prior to visit.    ALLERGIES: Patient has no known allergies.  Family History  Problem Relation Age of Onset  . Hyperlipidemia Mother   . Alcohol abuse Mother   . Melanoma Father   . Melanoma Paternal Aunt 84  . Heart disease Maternal Grandfather   . Breast cancer Maternal Aunt 63  . Bipolar disorder Brother     SH:  Single, non smoker  Review of Systems  Constitutional: Negative.   HENT: Negative.   Eyes: Negative.   Respiratory: Negative.   Cardiovascular: Negative.   Gastrointestinal: Negative.   Endocrine: Negative.   Genitourinary: Negative.   Musculoskeletal: Negative.   Skin: Negative.   Allergic/Immunologic: Negative.   Neurological: Negative.   Hematological: Negative.   Psychiatric/Behavioral: Negative.     PHYSICAL EXAMINATION:    Wt 225 lb (102.1 kg)   LMP 11/26/2019 (Exact Date)   BMI 33.23 kg/m     General appearance: alert, cooperative and appears stated age Abd:  Soft, NT, ND, no masses Lymph:  no inguinal LAD noted  Pelvic: External genitalia:  no lesions              Urethra:  normal appearing urethra with no masses, tenderness or lesions              Bartholins and Skenes: normal                 Vagina: normal appearing vagina  with normal color and discharge, no lesions              Cervix: no lesions              Bimanual Exam:  Uterus:  normal size, contour, position, consistency, mobility, non-tender              Adnexa: no mass, fullness, tenderness              Chaperone, Royal Hawthorn, CMA, was present for exam.  Assessment: Recently ending of relationship with desire for STD testing  Plan: HIV, RPR, Hep B and C testing obtained GC/Chl/Trich testing obtained as well Results will be communicated to pt

## 2019-12-05 ENCOUNTER — Encounter: Payer: Self-pay | Admitting: Obstetrics & Gynecology

## 2019-12-05 ENCOUNTER — Other Ambulatory Visit: Payer: Self-pay

## 2019-12-05 ENCOUNTER — Ambulatory Visit: Payer: BC Managed Care – PPO | Admitting: Obstetrics & Gynecology

## 2019-12-05 VITALS — BP 120/80 | HR 70 | Resp 20 | Wt 225.0 lb

## 2019-12-05 DIAGNOSIS — Z113 Encounter for screening for infections with a predominantly sexual mode of transmission: Secondary | ICD-10-CM | POA: Diagnosis not present

## 2019-12-06 LAB — HEPATITIS C ANTIBODY: Hep C Virus Ab: <0.1 s/co ratio (ref 0.0–0.9)

## 2019-12-06 LAB — HEP, RPR, HIV PANEL
HIV Screen 4th Generation wRfx: NONREACTIVE
Hepatitis B Surface Ag: NEGATIVE
RPR Ser Ql: NONREACTIVE

## 2019-12-07 LAB — CHLAMYDIA/GONOCOCCUS/TRICHOMONAS, NAA
Chlamydia by NAA: NEGATIVE
Gonococcus by NAA: NEGATIVE
Trich vag by NAA: NEGATIVE

## 2019-12-19 ENCOUNTER — Encounter: Payer: Self-pay | Admitting: Obstetrics & Gynecology

## 2019-12-24 ENCOUNTER — Telehealth: Payer: Self-pay | Admitting: Neurology

## 2019-12-24 DIAGNOSIS — R251 Tremor, unspecified: Secondary | ICD-10-CM

## 2019-12-24 NOTE — Telephone Encounter (Signed)
Ok.  See last note

## 2019-12-24 NOTE — Telephone Encounter (Signed)
Spoke with patient and she states her shaking at night has gotten a little better and she would like to have that test.   Do you want to proceed with EEG?

## 2019-12-24 NOTE — Telephone Encounter (Signed)
Patient notified that test has been ordered. Informed her that the woman who performs and schedules the test is on vacation. Informed her that once she returns she will contact her to schedule her appointment and give her more information about the test. She voiced understanding.   Order placed and message sent to Manuela Schwartz the [employer].

## 2019-12-24 NOTE — Telephone Encounter (Signed)
Patient reports she's still shaking at night internally. She is requesting some testing be done that Dr Tat mentioned. Please call.

## 2020-01-01 ENCOUNTER — Encounter: Payer: Self-pay | Admitting: Physician Assistant

## 2020-01-01 ENCOUNTER — Other Ambulatory Visit: Payer: Self-pay

## 2020-01-01 ENCOUNTER — Ambulatory Visit: Payer: BC Managed Care – PPO | Admitting: Physician Assistant

## 2020-01-01 VITALS — BP 126/82 | HR 64 | Temp 98.6°F | Ht 69.0 in | Wt 227.0 lb

## 2020-01-01 DIAGNOSIS — H0100A Unspecified blepharitis right eye, upper and lower eyelids: Secondary | ICD-10-CM | POA: Diagnosis not present

## 2020-01-01 MED ORDER — ERYTHROMYCIN 5 MG/GM OP OINT: 1.0000 "application " | TOPICAL_OINTMENT | Freq: Two times a day (BID) | OPHTHALMIC | 0 refills | Status: DC

## 2020-01-01 NOTE — Patient Instructions (Addendum)
It was great to see you.  Start eye ointment and compressions  If worsening symptoms, please let us know (see below)   Blepharitis Blepharitis is inflammation of the eyelids. Blepharitis may happen with:  Reddish, scaly skin around the scalp and eyebrows.  Burning or itching of the eyelids.  Eye discharge at night that causes the eyelashes to stick together in the morning.  Eyelashes that fall out.  Sensitivity to light. Follow these instructions at home: Pay attention to any changes in how your eyes look or feel. Tell your health care provider about any changes. Follow these instructions to help with your condition. Keeping Coca-Cola your hands often.  Wash your eyelids with warm water or with warm water that is mixed with a small amount of baby shampoo. Do this two times per day or as often as needed.  Wash your face and eyebrows at least once a day.  Use a clean towel each time you dry your eyelids. Do not use this towel to clean or dry other areas of your body. Do not share your towel with anyone. General instructions  Avoid wearing makeup until you get better. Do not share makeup with anyone.  Avoid rubbing your eyes.  Apply warm or cool compresses to your eyes 2 times per day for 10 minutes at a time, or as told by your health care provider.  If you were prescribed an antibiotic ointment or steroid drops, apply or use the medicine as told by your health care provider. Do not stop using the medicine even if you feel better.  Keep all follow-up visits as told by your health care provider. This is important. Contact a health care provider if:  Your eyelids feel hot.  You have blisters or a rash on your eyelids.  The condition does not go away in 2-4 days.  The inflammation gets worse. Get help right away if:  You have pain or redness that gets worse or spreads to other parts of your face.  Your vision changes.  You have pain when looking at lights or  moving objects.  You have a fever. Summary  Blepharitis is inflammation of the eyelids.  Pay attention to any changes in how your eyes look or feel. Tell your health care provider about any changes.  Follow home care instructions as told by your health care provider. Wash your hands often. Avoid wearing makeup. Do not rub your eyes.  To treat this condition, use warm compresses and prescription ointments or eye drops.  Let your health care provider know if you have vision changes, blisters or rash on eyelids, pain that spreads to your face, or warmth on your eyelids. This information is not intended to replace advice given to you by your health care provider. Make sure you discuss any questions you have with your health care provider. Document Revised: 10/30/2017 Document Reviewed: 10/30/2017 Elsevier Patient Education  [place].

## 2020-01-01 NOTE — Progress Notes (Signed)
Joyce Gray is a 44 y.o. female here for a new problem.  I, Joyce Gray,acting as a Education administrator for [employer], [employer].,have documented all relevant documentation on the behalf of Joyce Gray, [place], as directed by  Joyce Coke, PA while in the presence of Joyce Gray, [place].  History of Present Illness:   Chief Complaint  Patient presents with  . Belepharitis    right > Left x 1 day     HPI   Eye Pain Pt c/o swollen, dry and scaly right eye with pain. Has been dry and scaly for about a week. Swelling started today in right eye more than left. Admits to applying her mascara a little more aggressively than she normally does. Wears monthly contacts often. Denies any URI symptoms, vision changes, fever, headaches, nausea or vomiting. She has a little drainage in right eye but not much.    Past Medical History:  Diagnosis Date  . Abnormal Pap smear of cervix 1999, 2016  . Anxiety   . Depression   . Incomplete right bundle branch block   . Lyme disease   . Rocky [place] spotted fever      Social History   Tobacco Use  . Smoking status: Never Smoker  . Smokeless tobacco: Never Used  Vaping Use  . Vaping Use: Never used  Substance Use Topics  . Alcohol use: Not Currently  . Drug use: No    Past Surgical History:  Procedure Laterality Date  . CRYOTHERAPY  1999   for abnormal pap smear  . TYMPANOSTOMY TUBE PLACEMENT      Family History  Problem Relation Age of Onset  . Hyperlipidemia Mother   . Alcohol abuse Mother   . Melanoma Father   . Melanoma Paternal Aunt 59  . Heart disease Maternal Grandfather   . Breast cancer Maternal Aunt 63  . Bipolar disorder Brother     No Known Allergies  Current Medications:   Current Outpatient Medications:  .  doxycycline (VIBRA-TABS) 100 MG tablet, Take 100 mg by mouth 2 (two) times daily., Disp: , Rfl:  .  escitalopram (LEXAPRO) 5 MG tablet, Take 5 mg by mouth daily., Disp: , Rfl:  .  GLUCOSAMINE-CHONDROITIN  PO, Take by mouth. Glucosamine 1500 mg & Chondroitin 120 mg, Disp: , Rfl:  .  Multiple Vitamin (MULTIVITAMIN) capsule, Take 1 capsule by mouth daily., Disp: , Rfl:  .  Multiple Vitamins-Minerals (MULTIVITAMIN PO), Take by mouth daily. , Disp: , Rfl:  .  traZODone (DESYREL) 50 MG tablet, Take 0.5-1 tablets (25-50 mg total) by mouth at bedtime as needed for sleep., Disp: 30 tablet, Rfl: 3 .  VITAMIN D PO, Take 7,500 Int'l Units by mouth., Disp: , Rfl:  .  erythromycin ophthalmic ointment, Place 1 application into the right eye in the morning and at bedtime., Disp: 3.5 g, Rfl: 0   Review of Systems:   ROS  Negative unless otherwise specified per HPI.  Vitals:   Vitals:   01/01/20 1350  BP: 126/82  Pulse: 64  Temp: 98.6 F (37 C)  TempSrc: Temporal  SpO2: 98%  Weight: 227 lb (103 kg)  Height: 5\' 9"  (1.753 m)     Body mass index is 33.52 kg/m.  Physical Exam:   Physical Exam Vitals and nursing note reviewed.  Constitutional:      General: She is not in acute distress.    Appearance: She is well-developed. She is not ill-appearing or toxic-appearing.  HENT:     Head:  Normocephalic and atraumatic.     Right Ear: External ear normal.     Left Ear: External ear normal.     Nose: Nose normal.     Right Sinus: No maxillary sinus tenderness or frontal sinus tenderness.     Left Sinus: No maxillary sinus tenderness or frontal sinus tenderness.  Eyes:     General: Lids are normal.     Extraocular Movements: Extraocular movements intact.     Conjunctiva/sclera: Conjunctivae normal.     Comments: Slight erythema and mild swelling to upper and lower R eyelid  Neck:     Trachea: Trachea normal.  Cardiovascular:     Rate and Rhythm: Normal rate and regular rhythm.     Heart sounds: Normal heart sounds, S1 normal and S2 normal.  Pulmonary:     Effort: Pulmonary effort is normal.     Breath sounds: Normal breath sounds. No decreased breath sounds, wheezing, rhonchi or rales.   Lymphadenopathy:     Cervical: No cervical adenopathy.  Skin:    General: Skin is warm and dry.  Neurological:     Mental Status: She is alert.  Psychiatric:        Speech: Speech normal.        Behavior: Behavior normal. Behavior is cooperative.       Assessment and Plan:   Selinda was seen today for belepharitis.  Diagnoses and all orders for this visit:  Blepharitis of both upper and lower eyelid of right eye, unspecified type  Other orders -     erythromycin ophthalmic ointment; Place 1 application into the right eye in the morning and at bedtime.   No red flags on exam.  Discussed eye hygiene -- new mascara and fresh contacts after current symptoms resolve. Start topical erythromycin ointment. Handout provided and worsening precautions advised.   CMA or LPN served as scribe during this visit. History, Physical, and Plan performed by medical provider. The above documentation has been reviewed and is accurate and complete.  Joyce Coke, PA-C

## 2020-01-08 ENCOUNTER — Telehealth: Payer: Self-pay | Admitting: *Deleted

## 2020-01-08 NOTE — Telephone Encounter (Signed)
LMOM to call back to schedule her 48 hour ambulatory EEG. As of right now there may be an August 30th appointment available.

## 2020-01-21 NOTE — Telephone Encounter (Signed)
Scheduled her 48hour ambulatory EEG in December per patient request. 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

## 2020-03-02 ENCOUNTER — Ambulatory Visit (INDEPENDENT_AMBULATORY_CARE_PROVIDER_SITE_OTHER): Payer: BC Managed Care – PPO

## 2020-03-02 ENCOUNTER — Ambulatory Visit (INDEPENDENT_AMBULATORY_CARE_PROVIDER_SITE_OTHER): Payer: BC Managed Care – PPO | Admitting: Podiatry

## 2020-03-02 ENCOUNTER — Other Ambulatory Visit: Payer: Self-pay

## 2020-03-02 DIAGNOSIS — S9002XA Contusion of left ankle, initial encounter: Secondary | ICD-10-CM

## 2020-03-02 DIAGNOSIS — D1723 Benign lipomatous neoplasm of skin and subcutaneous tissue of right leg: Secondary | ICD-10-CM | POA: Diagnosis not present

## 2020-03-02 DIAGNOSIS — D1724 Benign lipomatous neoplasm of skin and subcutaneous tissue of left leg: Secondary | ICD-10-CM | POA: Diagnosis not present

## 2020-03-02 DIAGNOSIS — S93402A Sprain of unspecified ligament of left ankle, initial encounter: Secondary | ICD-10-CM | POA: Diagnosis not present

## 2020-03-02 MED ORDER — MELOXICAM 15 MG PO TABS: 15.0000 mg | ORAL_TABLET | Freq: Every day | ORAL | 1 refills | Status: DC

## 2020-03-02 NOTE — Progress Notes (Signed)
   Subjective:  44 y.o. female presenting today as a new patient for evaluation of left ankle pain is been going on approximately 1-2 months now.  Sudden onset.  She noticed during fitness class that her ankle was starting to bother her.  She rates the pain 2/10.  She denies any history of trauma.  She has not done anything for treatment   Past Medical History:  Diagnosis Date  . Abnormal Pap smear of cervix 1999, 2016  . Anxiety   . Depression   . Incomplete right bundle branch block   . Lyme disease   . [facility] spotted fever      Objective / Physical Exam:  General:  The patient is alert and oriented x3 in no acute distress. Dermatology:  Skin is warm, dry and supple bilateral lower extremities. Negative for open lesions or macerations. Vascular:  Palpable pedal pulses bilaterally. No edema or erythema noted. Capillary refill within normal limits. Neurological:  Epicritic and protective threshold grossly intact bilaterally.  Musculoskeletal Exam:  Pain on palpation to the anterior lateral medial aspects of the patient's left ankle. Mild edema noted. Range of motion within normal limits to all pedal and ankle joints bilateral. Muscle strength 5/5 in all groups bilateral.  ankle lipomas also noted to the anterior lateral aspect of the bilateral ankle joints  Radiographic Exam:  Normal osseous mineralization. Joint spaces preserved. No fracture/dislocation/boney destruction.    Assessment: 1.  Mild ankle sprain left 2.  Ankle lipomas bilateral  Plan of Care:  1. Patient was evaluated. X-Rays reviewed.  2.  Ankle brace dispensed.  Wear daily x4 weeks 3.  Prescription for meloxicam 15 mg daily as needed 4.  Return to clinic as needed  *Merchant navy officer at [facility]   Edrick Kins, DPM [facility]  Dr. Edrick Kins, [place] [address] [place]                                        [place], [place] [address]                Office [phone]    Fax [phone]

## 2020-03-24 ENCOUNTER — Other Ambulatory Visit (HOSPITAL_COMMUNITY)
Admission: RE | Admit: 2020-03-24 | Discharge: 2020-03-24 | Disposition: A | Discharge status: Home / Self Care | Payer: BC Managed Care – PPO | Source: Ambulatory Visit | Attending: Obstetrics & Gynecology | Admitting: Obstetrics & Gynecology

## 2020-03-24 ENCOUNTER — Other Ambulatory Visit: Payer: Self-pay

## 2020-03-24 ENCOUNTER — Encounter: Payer: Self-pay | Admitting: Obstetrics & Gynecology

## 2020-03-24 ENCOUNTER — Ambulatory Visit: Payer: BC Managed Care – PPO | Admitting: Obstetrics & Gynecology

## 2020-03-24 VITALS — BP 122/74 | HR 64 | Resp 16 | Ht 69.0 in | Wt 220.0 lb

## 2020-03-24 DIAGNOSIS — Z01419 Encounter for gynecological examination (general) (routine) without abnormal findings: Secondary | ICD-10-CM

## 2020-03-24 DIAGNOSIS — B977 Papillomavirus as the cause of diseases classified elsewhere: Secondary | ICD-10-CM

## 2020-03-24 DIAGNOSIS — Z124 Encounter for screening for malignant neoplasm of cervix: Secondary | ICD-10-CM

## 2020-03-24 NOTE — Progress Notes (Signed)
44 y.o. G0P0000 Single White or Caucasian female here for annual exam.  Doing well.  Did have rocky [place] spotted fever last year.  Was treated with doxycycline.    Cycles have been ever 26 days.  Flow is heavy and lasts 5-6 days.  Last Hb was 13.0.  Does take a MVI.   Has tried to work on weight this year.  Exercising more.    Patient's last menstrual period was 03/04/2020.          Sexually active: No.  The current method of family planning is abstinence.    Exercising: Yes.    3 times a week Smoker:  no  Health Maintenance: Pap:   02/27/19 Neg:Pos HR HPV:Neg 16/18/45  01/26/17 Negative  History of abnormal Pap:  yes MMG:  12/19/19 BIRADS 1 negative/density a Colonoscopy:  n/a BMD:   n/a TDaP:  03/15/19 Pneumonia vaccine(s):  n/a Shingrix:   n/a Hep C testing: 12/05/19 Neg Screening Labs: PCP   reports that she has never smoked. She has never used smokeless tobacco. She reports current alcohol use of about 1.0 - 2.0 standard drink of alcohol per week. She reports that she does not use drugs.  Past Medical History:  Diagnosis Date  . Abnormal Pap smear of cervix 1999, 2016  . Anxiety   . Depression   . Incomplete right bundle branch block   . Lyme disease   . Rocky [place] spotted fever     Past Surgical History:  Procedure Laterality Date  . CRYOTHERAPY  1999   for abnormal pap smear  . TYMPANOSTOMY TUBE PLACEMENT      Current Outpatient Medications  Medication Sig Dispense Refill  . escitalopram (LEXAPRO) 5 MG tablet Take 5 mg by mouth daily.    Marland Kitchen GLUCOSAMINE-CHONDROITIN PO Take by mouth. Glucosamine 1500 mg & Chondroitin 120 mg    . meloxicam (MOBIC) 15 MG tablet Take 1 tablet (15 mg total) by mouth daily. 30 tablet 1  . Multiple Vitamin (MULTIVITAMIN) capsule Take 1 capsule by mouth daily.    . Multiple Vitamins-Minerals (MULTIVITAMIN PO) Take by mouth daily.     . traZODone (DESYREL) 50 MG tablet Take 0.5-1 tablets (25-50 mg total) by mouth at bedtime as needed  for sleep. 30 tablet 3  . VITAMIN D PO Take 7,500 Int'l Units by mouth.     No current facility-administered medications for this visit.    Family History  Problem Relation Age of Onset  . Hyperlipidemia Mother   . Alcohol abuse Mother   . Melanoma Father   . Melanoma Paternal Aunt 45  . Heart disease Maternal Grandfather   . Breast cancer Maternal Aunt 63  . Bipolar disorder Brother     Review of Systems  All other systems reviewed and are negative.   Exam:   BP 122/74 (BP Location: Right Arm, Patient Position: Sitting, Cuff Size: Normal)   Pulse 64   Resp 16   Ht 5\' 9"  (1.753 m)   Wt 220 lb (99.8 kg)   LMP 03/04/2020   BMI 32.49 kg/m   Height: 5\' 9"  (175.3 cm)  General appearance: alert, cooperative and appears stated age Head: Normocephalic, without obvious abnormality, atraumatic Neck: no adenopathy, supple, symmetrical, trachea midline and thyroid normal to inspection and palpation Lungs: clear to auscultation bilaterally Breasts: normal appearance, no masses or tenderness Heart: regular rate and rhythm Abdomen: soft, non-tender; bowel sounds normal; no masses,  no organomegaly Extremities: extremities normal, atraumatic, no cyanosis or edema  Skin: Skin color, texture, turgor normal. No rashes or lesions Lymph nodes: Cervical, supraclavicular, and axillary nodes normal. No abnormal inguinal nodes palpated Neurologic: Grossly normal   Pelvic: External genitalia:  no lesions              Urethra:  normal appearing urethra with no masses, tenderness or lesions              Bartholins and Skenes: normal                 Vagina: normal appearing vagina with normal color and discharge, no lesions              Cervix: no lesions              Pap taken: Yes.   Bimanual Exam:  Uterus:  normal size, contour, position, consistency, mobility, non-tender              Adnexa: normal adnexa and no mass, fullness, tenderness               Rectovaginal: Confirms                Anus:  normal sphincter tone, no lesions  Chaperone, Terence Lux, CMA, was present for exam.  A:  Well Woman with normal exam Uterine fibroids Dysmenorrhea H/o ASCUS pap with +HR HPV 2016, neg pap with +HR HPV 2020 (neg 16/18/45) H/o rocky mt spotted fever  P:   Mammogram guidelines reviewed.  Doing yearly. pap smear with HR HPV obtained today No blood work obtained today Declines treatment for bleeding today Colon cancer screening guidelines reviewed Return annually or prn

## 2020-03-27 LAB — CYTOLOGY - PAP
Comment: NEGATIVE
Diagnosis: NEGATIVE
High risk HPV: NEGATIVE

## 2020-03-30 ENCOUNTER — Telehealth: Payer: Self-pay

## 2020-03-30 NOTE — Telephone Encounter (Signed)
-----   Message from Megan Salon, MD sent at 03/29/2020  6:05 AM EST ----- Please let pt know her pap was normal and HR HPV was negative.  She had a normal pap with +HR HPV in 2020 so she does need repeat HR HPV based testing/pap 1 year.  12 month recall needed.  Please remove from current recall.  Also, there was yeast present.  If having any symptoms, ok to treat with Terzaol 7, one applicator nightly x 7 nights or diflucan 150mg  po x 1.  Thanks.  If in any recall, ok to remove.

## 2020-03-30 NOTE — Telephone Encounter (Signed)
Tried calling patient with results. No answer, left message for patient to call me back.  

## 2020-03-31 ENCOUNTER — Telehealth: Payer: Self-pay

## 2020-03-31 NOTE — Telephone Encounter (Signed)
See below

## 2020-03-31 NOTE — Telephone Encounter (Signed)
Pt states the condition she was seen for on 01/01/20, Blepharitis, has returned. She is requesting a refill of the topical cream she was prescribed, as well as the steroid drops that were mentioned to her.

## 2020-03-31 NOTE — Telephone Encounter (Signed)
I didn't see her for this. Recommend she schedule OV.  Algis Greenhouse. [name], MD 03/31/2020 1:00 PM

## 2020-03-31 NOTE — Telephone Encounter (Signed)
LVM to return call, schedule appointment with PCP

## 2020-03-31 NOTE — Telephone Encounter (Signed)
Tried calling patient with results. No answer, left message for patient to call me back.  

## 2020-04-01 ENCOUNTER — Ambulatory Visit: Payer: BC Managed Care – PPO | Admitting: Physician Assistant

## 2020-04-03 ENCOUNTER — Other Ambulatory Visit: Payer: Self-pay

## 2020-04-03 MED ORDER — FLUCONAZOLE 150 MG PO TABS
150.0000 mg | ORAL_TABLET | Freq: Once | ORAL | 0 refills | Status: AC
Start: 2020-04-03 — End: 2020-04-03

## 2020-04-03 NOTE — Telephone Encounter (Signed)
Patient notified of results and states that she has had some vaginal itching. Per patient, would like Diflucan sent to [facility]. Prescription sent to pharmacy. Patient verbalizes understanding and is agreeable. Okay to close encounter.

## 2020-04-28 ENCOUNTER — Other Ambulatory Visit: Payer: Self-pay | Admitting: Podiatry

## 2020-04-29 NOTE — Telephone Encounter (Signed)
Please advise 

## 2020-05-11 ENCOUNTER — Other Ambulatory Visit: Payer: BC Managed Care – PPO

## 2020-05-30 LAB — NOVEL CORONAVIRUS, NAA: SARS-CoV-2, NAA: POSITIVE

## 2020-06-02 ENCOUNTER — Ambulatory Visit: Payer: BC Managed Care – PPO | Admitting: Obstetrics & Gynecology

## 2020-06-11 ENCOUNTER — Encounter: Payer: BC Managed Care – PPO | Admitting: Family Medicine

## 2020-06-22 ENCOUNTER — Ambulatory Visit (INDEPENDENT_AMBULATORY_CARE_PROVIDER_SITE_OTHER): Payer: BC Managed Care – PPO | Admitting: Family Medicine

## 2020-06-22 ENCOUNTER — Encounter: Payer: Self-pay | Admitting: Family Medicine

## 2020-06-22 ENCOUNTER — Other Ambulatory Visit: Payer: Self-pay

## 2020-06-22 VITALS — BP 122/74 | HR 73 | Temp 98.2°F | Ht 69.0 in | Wt 218.4 lb

## 2020-06-22 DIAGNOSIS — K8050 Calculus of bile duct without cholangitis or cholecystitis without obstruction: Secondary | ICD-10-CM

## 2020-06-22 MED ORDER — ONDANSETRON 8 MG PO TBDP: 8.0000 mg | ORAL_TABLET | Freq: Three times a day (TID) | ORAL | 0 refills | Status: DC | PRN

## 2020-06-22 NOTE — Patient Instructions (Signed)
It was very nice to see you today!  I think you are having gallstones.  We will check blood work today and check an ultrasound to confirm this.  Please take Zofran if your nausea returns.  If your symptoms return or last for more than 2 to 3 hours please let me know.  Take care, Dr [name]  Please try these tips to maintain a healthy lifestyle:   Eat at least 3 REAL meals and 1-2 snacks per day.  Aim for no more than 5 hours between eating.  If you eat breakfast, please do so within one hour of getting up.    Each meal should contain half fruits/vegetables, one quarter protein, and one quarter carbs (no bigger than a computer mouse)   Cut down on sweet beverages. This includes juice, soda, and sweet tea.     Drink at least 1 glass of water with each meal and aim for at least 8 glasses per day   Exercise at least 150 minutes every week.    Biliary Colic, Adult  Biliary colic is severe pain caused by a problem with the gallbladder. The gallbladder is a small organ in the upper right part of the abdomen. The gallbladder stores a digestive fluid produced in the liver (bile) that helps the body break down fat. Bile and other digestive enzymes are carried from the liver to the small intestine through tube-like structures called bile ducts. The gallbladder and the bile ducts form the biliary tract. Sometimes, hard deposits of digestive fluids (gallstones) form in the gallbladder and block the flow of bile from the gallbladder, causing biliary colic. This condition is also called a gallbladder attack. Gallstones can be as small as a grain of sand or as big as a golf ball. There could be just one gallstone in the gallbladder, or there could be many. What are the causes? This condition is usually caused by gallstones. Less often, a tumor could block the flow of bile from the gallbladder and trigger biliary colic. What increases the risk? The following factors may make you more likely to  develop this condition:  Being female.  Having a family history of gallstones.  Being obese.  Losing weight suddenly or quickly.  Eating a diet that is high in calories, low in fiber, and rich in refined carbohydrates, such as white bread and white rice.  Having certain health conditions, such as: ? An intestinal disease that affects nutrient absorption, such as Crohn's disease. ? A metabolic condition, such as diabetes or metabolic syndrome. Metabolic syndrome occurs when someone has high blood pressure, high cholesterol, and diabetes. ? A blood condition, such as hemolytic anemia or sickle cell disease. What are the signs or symptoms? The main symptom of this condition is severe pain in the upper right side of the abdomen. You may feel this pain below the chest but above the hip. This pain often occurs at night or after eating a meal that is high in fat. This pain may get worse for up to an hour and last as long as 12 hours. In most cases, the pain fades (subsides) within 2 hours. Other symptoms of this condition include:  Nausea and vomiting.  Pain under the right shoulder. How is this diagnosed? This condition is diagnosed based on your medical history, your symptoms, and a physical exam. You may also have tests, including:  Blood tests to rule out infection or inflammation of the bile ducts, gallbladder, pancreas, or liver.  Imaging studies, such  as: ? An ultrasound. ? A CT scan. ? An MRI. In some cases, you may need to have an imaging study done using a small amount of radioactive material (nuclear medicine) to confirm the diagnosis. How is this treated? This condition may be treated with medicines to:  Relieve your pain or nausea.  Dissolve the gallstones. It may take months or years before the gallstones are completely gone. If you have gallstones, or if you have a tumor in the gallbladder that is causing biliary colic, you may need surgery to remove the gallbladder  (cholecystectomy). Follow these instructions at home: Eating and drinking  Drink enough fluid to keep your urine pale yellow.  Follow instructions from your health care provider about eating or drinking restrictions. These may include avoiding: ? Fatty, greasy, and fried foods. ? Any foods that make the pain worse. ? Overeating. ? Having a large meal after not eating for a while. General instructions  Take over-the-counter and prescription medicines only as told by your health care provider.  Keep all follow-up visits as told by your health care provider. This is important. How is this prevented? Steps to prevent this condition include:  Maintaining a healthy body weight.  Getting regular exercise.  Eating a healthy diet that is high in fiber and low in fat.  Limiting how much sugar and refined carbohydrates you eat. Contact a health care provider if:  Your pain lasts more than 5 hours.  You vomit.  You have a fever and chills.  Your pain gets worse. Get help right away if:  Your skin or the whites of your eyes look yellow (jaundice).  Your have tea-colored urine and light-colored stools (feces).  You are dizzy or you faint. Summary  Biliary colic is severe pain caused by a problem with the gallbladder. The gallbladder is a small organ in the upper right part of your abdomen.  Treatment for this condition may include medicine to relieve your pain or nausea, or medicine to slowly dissolve the gallstones.  If you have gallstones, or if you have a tumor in the gallbladder that is causing biliary colic, you may need surgery to remove the gallbladder (cholecystectomy). This information is not intended to replace advice given to you by your health care provider. Make sure you discuss any questions you have with your health care provider. Document Revised: 05/21/2019 Document Reviewed: 03/12/2019 Elsevier Patient Education  [place].

## 2020-06-22 NOTE — Progress Notes (Signed)
   Joyce Gray is a 45 y.o. female who presents today for an office visit.  Assessment/Plan:  New/Acute Problems: Biliary Colic Symptoms consistent with biliary colic.  No recurrent symptoms and reassuring abdominal exam.  Will check labs today including CBC, CMET and lipase.  Will check right upper quadrant ultrasound.  Will need surgical referral depending on results of ultrasound.  Gastritis/PUD also on differential though much less likely given that she has not had any recurrence of symptoms.  Discussed ER precautions.  Will give Zofran to use as needed in case symptoms return.    Subjective:  HPI:  Patient here with an episode of abdominal pain that happened 10 days ago.  She was diagnosed with Covid about 3 weeks ago is not sure if this is contributed.  Symptoms started the middle the night after eating a slice of pizza.  Had severe pain in the middle lower abdomen and lower chest.  Pain was so severe that she called 911.  She had an episode of vomiting which seemed to resolve the pain.  In total pain lasted for about 2 to 2-1/2 hours.  She has not had any recurrence of symptoms since then.  No reported bowel changes.       Objective:  Physical Exam: BP 122/74   Pulse 73   Temp 98.2 F (36.8 C)   Ht 5\' 9"  (1.753 m)   Wt 218 lb 6.4 oz (99.1 kg)   SpO2 100%   BMI 32.25 kg/m   Gen: No acute distress, resting comfortably CV: Regular rate and rhythm with no murmurs appreciated Pulm: Normal work of breathing, clear to auscultation bilaterally with no crackles, wheezes, or rhonchi GI: Bowel sounds present.  Mildly tender to palpation in right upper quadrant and epigastric area.  No rebound or guarding. Neuro: Grossly normal, moves all extremities Psych: Normal affect and thought content      Joyce Gray M. [name], MD 06/22/2020 3:35 PM

## 2020-06-23 LAB — COMPREHENSIVE METABOLIC PANEL
ALT: 19 U/L (ref 0–35)
AST: 21 U/L (ref 0–37)
Albumin: 4.3 g/dL (ref 3.5–5.2)
Alkaline Phosphatase: 74 U/L (ref 39–117)
BUN: 15 mg/dL (ref 6–23)
CO2: 26 mEq/L (ref 19–32)
Calcium: 9.8 mg/dL (ref 8.4–10.5)
Chloride: 103 mEq/L (ref 96–112)
Creatinine, Ser: 0.83 mg/dL (ref 0.40–1.20)
GFR: 86.14 mL/min (ref >60.00)
Glucose, Bld: 116 mg/dL — ABNORMAL HIGH (ref 70–99)
Potassium: 3.8 mEq/L (ref 3.5–5.1)
Sodium: 137 mEq/L (ref 135–145)
Total Bilirubin: 0.4 mg/dL (ref 0.2–1.2)
Total Protein: 7.6 g/dL (ref 6.0–8.3)

## 2020-06-23 LAB — CBC
HCT: 39.3 % (ref 36.0–46.0)
Hemoglobin: 13.1 g/dL (ref 12.0–15.0)
MCHC: 33.4 g/dL (ref 30.0–36.0)
MCV: 88.6 fl (ref 78.0–100.0)
Platelets: 356 10*3/uL (ref 150.0–400.0)
RBC: 4.44 Mil/uL (ref 3.87–5.11)
RDW: 13.8 % (ref 11.5–15.5)
WBC: 13.2 10*3/uL — ABNORMAL HIGH (ref 4.0–10.5)

## 2020-06-23 LAB — LIPASE: Lipase: 28 U/L (ref 11.0–59.0)

## 2020-06-24 NOTE — Progress Notes (Signed)
Please inform patient of the following:  Her white blood cell numbers were elevated but everything else was normal. We still need to check her ultrasound.   I would like for her to come back in a few weeks to recheck a CBC.  Algis Greenhouse. [name], MD 06/24/2020 11:36 AM

## 2020-06-25 ENCOUNTER — Ambulatory Visit
Admission: RE | Admit: 2020-06-25 | Discharge: 2020-06-25 | Disposition: A | Discharge status: Home / Self Care | Payer: BC Managed Care – PPO | Source: Ambulatory Visit | Attending: Family Medicine | Admitting: Family Medicine

## 2020-06-25 ENCOUNTER — Other Ambulatory Visit: Payer: Self-pay

## 2020-06-25 DIAGNOSIS — D72829 Elevated white blood cell count, unspecified: Secondary | ICD-10-CM

## 2020-06-25 DIAGNOSIS — K8050 Calculus of bile duct without cholangitis or cholecystitis without obstruction: Secondary | ICD-10-CM

## 2020-06-25 NOTE — Progress Notes (Signed)
Please inform patient of the following:  Good news is that she had no gallstones. We do not have a good explanation for her symptoms otherwise. She possibly could be having inflammation in her stomach. Recommend GI referral for further evaluation.  Algis Greenhouse. [name], MD 06/25/2020 4:01 PM

## 2020-06-26 ENCOUNTER — Other Ambulatory Visit: Payer: Self-pay | Admitting: *Deleted

## 2020-06-26 DIAGNOSIS — K2970 Gastritis, unspecified, without bleeding: Secondary | ICD-10-CM

## 2020-07-10 ENCOUNTER — Other Ambulatory Visit: Payer: Self-pay

## 2020-07-10 ENCOUNTER — Other Ambulatory Visit (INDEPENDENT_AMBULATORY_CARE_PROVIDER_SITE_OTHER): Payer: BC Managed Care – PPO

## 2020-07-10 DIAGNOSIS — D72829 Elevated white blood cell count, unspecified: Secondary | ICD-10-CM | POA: Diagnosis not present

## 2020-07-10 LAB — CBC WITH DIFFERENTIAL/PLATELET
Absolute Monocytes: 704 cells/uL (ref 200–950)
Basophils Absolute: 32 cells/uL (ref 0–200)
Basophils Relative: 0.3 %
Eosinophils Absolute: 294 cells/uL (ref 15–500)
Eosinophils Relative: 2.8 %
HCT: 36.4 % (ref 35.0–45.0)
Hemoglobin: 12.5 g/dL (ref 11.7–15.5)
Lymphs Abs: 4148 cells/uL — ABNORMAL HIGH (ref 850–3900)
MCH: 30.3 pg (ref 27.0–33.0)
MCHC: 34.3 g/dL (ref 32.0–36.0)
MCV: 88.3 fL (ref 80.0–100.0)
MPV: 9.8 fL (ref 7.5–12.5)
Monocytes Relative: 6.7 %
Neutro Abs: 5324 cells/uL (ref 1500–7800)
Neutrophils Relative %: 50.7 %
Platelets: 308 10*3/uL (ref 140–400)
RBC: 4.12 10*6/uL (ref 3.80–5.10)
RDW: 12.9 % (ref 11.0–15.0)
Total Lymphocyte: 39.5 %
WBC: 10.5 10*3/uL (ref 3.8–10.8)

## 2020-07-10 NOTE — Addendum Note (Signed)
Addended by: Adah Salvage F on: 07/10/2020 04:09 PM   Modules accepted: Orders

## 2020-07-13 NOTE — Progress Notes (Signed)
Please inform patient of the following:  Blood counts are all NORMAL. Do not need to do any further testing at this time. We can recheck we she comes back in for labs.  Algis Greenhouse. [name], MD 07/13/2020 8:09 AM

## 2020-08-31 ENCOUNTER — Other Ambulatory Visit: Payer: Self-pay

## 2020-08-31 ENCOUNTER — Encounter: Payer: Self-pay | Admitting: Family Medicine

## 2020-08-31 ENCOUNTER — Ambulatory Visit (INDEPENDENT_AMBULATORY_CARE_PROVIDER_SITE_OTHER): Payer: BC Managed Care – PPO | Admitting: Family Medicine

## 2020-08-31 VITALS — BP 128/76 | HR 61 | Temp 97.9°F | Ht 69.5 in | Wt 216.6 lb

## 2020-08-31 DIAGNOSIS — Z0001 Encounter for general adult medical examination with abnormal findings: Secondary | ICD-10-CM

## 2020-08-31 DIAGNOSIS — Z79899 Other long term (current) drug therapy: Secondary | ICD-10-CM | POA: Diagnosis not present

## 2020-08-31 DIAGNOSIS — E669 Obesity, unspecified: Secondary | ICD-10-CM

## 2020-08-31 DIAGNOSIS — R110 Nausea: Secondary | ICD-10-CM | POA: Diagnosis not present

## 2020-08-31 DIAGNOSIS — E785 Hyperlipidemia, unspecified: Secondary | ICD-10-CM

## 2020-08-31 DIAGNOSIS — F329 Major depressive disorder, single episode, unspecified: Secondary | ICD-10-CM

## 2020-08-31 DIAGNOSIS — E559 Vitamin D deficiency, unspecified: Secondary | ICD-10-CM

## 2020-08-31 DIAGNOSIS — F419 Anxiety disorder, unspecified: Secondary | ICD-10-CM

## 2020-08-31 DIAGNOSIS — Z6831 Body mass index (BMI) 31.0-31.9, adult: Secondary | ICD-10-CM

## 2020-08-31 LAB — COMPREHENSIVE METABOLIC PANEL
ALT: 13 U/L (ref 0–35)
AST: 16 U/L (ref 0–37)
Albumin: 4 g/dL (ref 3.5–5.2)
Alkaline Phosphatase: 62 U/L (ref 39–117)
BUN: 11 mg/dL (ref 6–23)
CO2: 24 mEq/L (ref 19–32)
Calcium: 9 mg/dL (ref 8.4–10.5)
Chloride: 103 mEq/L (ref 96–112)
Creatinine, Ser: 0.73 mg/dL (ref 0.40–1.20)
GFR: 100.35 mL/min (ref >60.00)
Glucose, Bld: 86 mg/dL (ref 70–99)
Potassium: 4.6 mEq/L (ref 3.5–5.1)
Sodium: 135 mEq/L (ref 135–145)
Total Bilirubin: 0.3 mg/dL (ref 0.2–1.2)
Total Protein: 6.8 g/dL (ref 6.0–8.3)

## 2020-08-31 LAB — CBC
HCT: 38.9 % (ref 36.0–46.0)
Hemoglobin: 13.1 g/dL (ref 12.0–15.0)
MCHC: 33.7 g/dL (ref 30.0–36.0)
MCV: 89.3 fl (ref 78.0–100.0)
Platelets: 330 10*3/uL (ref 150.0–400.0)
RBC: 4.36 Mil/uL (ref 3.87–5.11)
RDW: 13.7 % (ref 11.5–15.5)
WBC: 10.5 10*3/uL (ref 4.0–10.5)

## 2020-08-31 LAB — TSH: TSH: 2 u[IU]/mL (ref 0.35–4.50)

## 2020-08-31 LAB — LIPID PANEL
Cholesterol: 173 mg/dL (ref 0–200)
HDL: 44.8 mg/dL (ref >39.00)
LDL Cholesterol: 106 mg/dL — ABNORMAL HIGH (ref 0–99)
NonHDL: 128.61
Total CHOL/HDL Ratio: 4
Triglycerides: 112 mg/dL (ref 0.0–149.0)
VLDL: 22.4 mg/dL (ref 0.0–40.0)

## 2020-08-31 LAB — HCG, QUANTITATIVE, PREGNANCY: Quantitative HCG: <0.6 m[IU]/mL

## 2020-08-31 LAB — POCT URINE PREGNANCY: Preg Test, Ur: NEGATIVE

## 2020-08-31 LAB — VITAMIN D 25 HYDROXY (VIT D DEFICIENCY, FRACTURES): VITD: 39.61 ng/mL (ref 30.00–100.00)

## 2020-08-31 LAB — VITAMIN B12: Vitamin B-12: 551 pg/mL (ref 211–911)

## 2020-08-31 NOTE — Assessment & Plan Note (Signed)
Stable on Lexapro 5 mg daily per psychiatry.

## 2020-08-31 NOTE — Assessment & Plan Note (Signed)
Check labs.  Discussed lifestyle modifications. 

## 2020-08-31 NOTE — Patient Instructions (Signed)
It was very nice to see you today!  We will check blood work today.  Please continue working on diet and exercise and making sure that you are getting plenty of fiber in your diet.  I will see you back in year for your next annual physical.  Come back to see me sooner if needed.  Take care, Dr [name]  PLEASE NOTE:  If you had any lab tests please let us know if you have not heard back within a few days. You may see your results on mychart before we have a chance to review them but we will give you a call once they are reviewed by [name]. If we ordered any referrals today, please let us know if you have not heard from their office within the next week.   Please try these tips to maintain a healthy lifestyle:   Eat at least 3 REAL meals and 1-2 snacks per day.  Aim for no more than 5 hours between eating.  If you eat breakfast, please do so within one hour of getting up.    Each meal should contain half fruits/vegetables, one quarter protein, and one quarter carbs (no bigger than a computer mouse)   Cut down on sweet beverages. This includes juice, soda, and sweet tea.     Drink at least 1 glass of water with each meal and aim for at least 8 glasses per day   Exercise at least 150 minutes every week.    Preventive Care 10-80 Years Old, Female Preventive care refers to lifestyle choices and visits with your health care provider that can promote health and wellness. This includes:  A yearly physical exam. This is also called an annual wellness visit.  Regular dental and eye exams.  Immunizations.  Screening for certain conditions.  Healthy lifestyle choices, such as: ? Eating a healthy diet. ? Getting regular exercise. ? Not using drugs or products that contain nicotine and tobacco. ? Limiting alcohol use. What can I expect for my preventive care visit? Physical exam Your health care provider will check your:  Height and weight. These may be used to calculate your BMI  (body mass index). BMI is a measurement that tells if you are at a healthy weight.  Heart rate and blood pressure.  Body temperature.  Skin for abnormal spots. Counseling Your health care provider may ask you questions about your:  Past medical problems.  Family's medical history.  Alcohol, tobacco, and drug use.  Emotional well-being.  Home life and relationship well-being.  Sexual activity.  Diet, exercise, and sleep habits.  Work and work Statistician.  Access to firearms.  Method of birth control.  Menstrual cycle.  Pregnancy history. What immunizations do I need? Vaccines are usually given at various ages, according to a schedule. Your health care provider will recommend vaccines for you based on your age, medical history, and lifestyle or other factors, such as travel or where you work.   What tests do I need? Blood tests  Lipid and cholesterol levels. These may be checked every 5 years, or more often if you are over 55 years old.  Hepatitis C test.  Hepatitis B test. Screening  Lung cancer screening. You may have this screening every year starting at age 17 if you have a 30-pack-year history of smoking and currently smoke or have quit within the past 15 years.  Colorectal cancer screening. ? All adults should have this screening starting at age 81 and continuing until age 44. ?  Your health care provider may recommend screening at age 24 if you are at increased risk. ? You will have tests every 1-10 years, depending on your results and the type of screening test.  Diabetes screening. ? This is done by checking your blood sugar (glucose) after you have not eaten for a while (fasting). ? You may have this done every 1-3 years.  Mammogram. ? This may be done every 1-2 years. ? Talk with your health care provider about when you should start having regular mammograms. This may depend on whether you have a family history of breast cancer.  BRCA-related  cancer screening. This may be done if you have a family history of breast, ovarian, tubal, or peritoneal cancers.  Pelvic exam and Pap test. ? This may be done every 3 years starting at age 41. ? Starting at age 29, this may be done every 5 years if you have a Pap test in combination with an HPV test. Other tests  STD (sexually transmitted disease) testing, if you are at risk.  Bone density scan. This is done to screen for osteoporosis. You may have this scan if you are at high risk for osteoporosis. Talk with your health care provider about your test results, treatment options, and if necessary, the need for more tests. Follow these instructions at home: Eating and drinking  Eat a diet that includes fresh fruits and vegetables, whole grains, lean protein, and low-fat dairy products.  Take vitamin and mineral supplements as recommended by your health care provider.  Do not drink alcohol if: ? Your health care provider tells you not to drink. ? You are pregnant, may be pregnant, or are planning to become pregnant.  If you drink alcohol: ? Limit how much you have to 0-1 drink a day. ? Be aware of how much alcohol is in your drink. In the [place] one drink equals one 12 oz bottle of beer (355 mL), one 5 oz glass of wine (148 mL), or one 1 oz glass of hard liquor (44 mL).   Lifestyle  Take daily care of your teeth and gums. Brush your teeth every morning and night with fluoride toothpaste. Floss one time each day.  Stay active. Exercise for at least 30 minutes 5 or more days each week.  Do not use any products that contain nicotine or tobacco, such as cigarettes, e-cigarettes, and chewing tobacco. If you need help quitting, ask your health care provider.  Do not use drugs.  If you are sexually active, practice safe sex. Use a condom or other form of protection to prevent STIs (sexually transmitted infections).  If you do not wish to become pregnant, use a form of birth control. If you  plan to become pregnant, see your health care provider for a prepregnancy visit.  If told by your health care provider, take low-dose aspirin daily starting at age 57.  Find healthy ways to cope with stress, such as: ? Meditation, yoga, or listening to music. ? Journaling. ? Talking to a trusted person. ? Spending time with friends and family. Safety  Always wear your seat belt while driving or riding in a vehicle.  Do not drive: ? If you have been drinking alcohol. Do not ride with someone who has been drinking. ? When you are tired or distracted. ? While texting.  Wear a helmet and other protective equipment during sports activities.  If you have firearms in your house, make sure you follow all gun safety procedures. What's  next?  Visit your health care provider once a year for an annual wellness visit.  Ask your health care provider how often you should have your eyes and teeth checked.  Stay up to date on all vaccines. This information is not intended to replace advice given to you by your health care provider. Make sure you discuss any questions you have with your health care provider. Document Revised: 02/11/2020 Document Reviewed: 01/18/2018 Elsevier Patient Education  2021 [employer].

## 2020-08-31 NOTE — Assessment & Plan Note (Signed)
Takes vitamin D supplementation.  Will check labs today.

## 2020-08-31 NOTE — Assessment & Plan Note (Signed)
Continue Lexapro 5 mg daily per psychiatry.

## 2020-08-31 NOTE — Progress Notes (Signed)
Chief Complaint:  Joyce Gray is a 45 y.o. female who presents today for her annual comprehensive physical exam.    Assessment/Plan:  New/Acute Problems: Nausea Past menstrual period 08/13/2020.  Patient is wondering if she could be pregnant.  We will check beta-hCG today.  Chronic Problems Addressed Today: Depression Stable on Lexapro 5 mg daily per psychiatry.  Vitamin D deficiency Takes vitamin D supplementation.  Will check labs today.  Dyslipidemia Check labs.  Discussed lifestyle modifications.  Anxiety Continue Lexapro 5 mg daily per psychiatry.   Body mass index is 31.53 kg/m. / Obese    Preventative Healthcare: Check labs.  Up-to-date on vaccines and other screenings.  Patient Counseling(The following topics were reviewed and/or handout was given):  -Nutrition: Stressed importance of moderation in sodium/caffeine intake, saturated fat and cholesterol, caloric balance, sufficient intake of fresh fruits, vegetables, and fiber.  -Stressed the importance of regular exercise.   -Substance Abuse: Discussed cessation/primary prevention of tobacco, alcohol, or other drug use; driving or other dangerous activities under the influence; availability of treatment for abuse.   -Injury prevention: Discussed safety belts, safety helmets, smoke detector, smoking near bedding or upholstery.   -Sexuality: Discussed sexually transmitted diseases, partner selection, use of condoms, avoidance of unintended pregnancy and contraceptive alternatives.   -Dental health: Discussed importance of regular tooth brushing, flossing, and dental visits.  -Health maintenance and immunizations reviewed. Please refer to Health maintenance section.  Return to care in 1 year for next preventative visit.     Subjective:  HPI:  She has no acute complaints today.  She has had some nausea breast tenderness for the last week and a half.  She has been sexually active with a new partner.  She would  like to have pregnancy test done today.  Lifestyle Diet: Balanced  Exercise: Works out multiple times weekly. Cardio and weight training.   Depression screen PHQ 2/9 06/22/2020  Decreased Interest 1  Down, Depressed, Hopeless 1  PHQ - 2 Score 2  Altered sleeping 0  Tired, decreased energy 1  Change in appetite 1  Feeling bad or failure about yourself  1  Trouble concentrating 1  Moving slowly or fidgety/restless 0  Suicidal thoughts -  PHQ-9 Score 6  Difficult doing work/chores -    There are no preventive care reminders to display for this patient.   ROS: Per HPI, otherwise a complete review of systems was negative.   PMH:  The following were reviewed and entered/updated in epic: Past Medical History:  Diagnosis Date  . Abnormal Pap smear of cervix 1999, 2016  . Anxiety   . Depression   . Incomplete right bundle branch block   . Lyme disease   . [facility] spotted fever 02/2019   Patient Active Problem List   Diagnosis Date Noted  . Vitamin D deficiency 08/31/2020  . Right bundle branch block (RBBB) 06/11/2019  . [place] spotted fever 02/09/2019  . Dyslipidemia 10/03/2018  . 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 Anxiety 07/11/2018  . 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 Depression    Past Surgical History:  Procedure Laterality Date  . CRYOTHERAPY  1999   for abnormal pap smear  . TYMPANOSTOMY TUBE PLACEMENT      Family History  Problem Relation Age of Onset  . Hyperlipidemia Mother   . Alcohol abuse Mother   . Melanoma Father   . Melanoma Paternal Aunt 47  . Heart disease Maternal Grandfather   . Breast cancer Maternal  Aunt 63  . Bipolar disorder Brother     Medications- reviewed and updated Current Outpatient Medications  Medication Sig Dispense Refill  . escitalopram (LEXAPRO) 5 MG tablet Take 5 mg by mouth daily.    Marland Kitchen GLUCOSAMINE-CHONDROITIN PO Take by mouth. Glucosamine 1500 mg & Chondroitin 120 mg    . Multiple Vitamin  (MULTIVITAMIN) capsule Take 1 capsule by mouth daily.    . ondansetron (ZOFRAN ODT) 8 MG disintegrating tablet Take 1 tablet (8 mg total) by mouth every 8 (eight) hours as needed for nausea or vomiting. 20 tablet 0  . traZODone (DESYREL) 50 MG tablet Take 0.5-1 tablets (25-50 mg total) by mouth at bedtime as needed for sleep. 30 tablet 3  . VITAMIN D PO Take 7,500 Int'l Units by mouth.     No current facility-administered medications for this visit.    Allergies-reviewed and updated No Known Allergies  Social History   Socioeconomic History  . Marital status: Single    Spouse name: Not on file  . Number of children: 0  . Years of education: 26  . Highest education level: Not on file  Occupational History  . Occupation: Product manager: [employer]  Tobacco Use  . Smoking status: Never Smoker  . Smokeless tobacco: Never Used  Vaping Use  . Vaping Use: Never used  Substance and Sexual Activity  . Alcohol use: Yes    Alcohol/week: 1.0 - 2.0 standard drink    Types: 1 - 2 Standard drinks or equivalent per week  . Drug use: No  . Sexual activity: Not Currently    Partners: Male    Birth control/protection: Abstinence  Other Topics Concern  . Not on file  Social History Narrative   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 Fun/Hobby: Hiking, reading, coffee with friends, dancing.    Social Determinants of Health   Financial Resource Strain: Not on file  Food Insecurity: Not on file  Transportation Needs: Not on file  Physical Activity: Not on file  Stress: Not on file  Social Connections: Not on file        Objective:  Physical Exam: BP 128/76   Pulse 61   Temp 97.9 F (36.6 C) (Temporal)   Ht 5' 9.5" (1.765 m)   Wt 216 lb 9.6 oz (98.2 kg)   LMP 08/13/2020   SpO2 100%   BMI 31.53 kg/m   Body mass index is 31.53 kg/m. Wt Readings from Last 3 Encounters:  08/31/20 216 lb 9.6 oz (98.2 kg)  06/22/20 218 lb 6.4 oz (99.1 kg)  03/24/20 220 lb (99.8 kg)   Gen: NAD,  resting comfortably HEENT: TMs normal bilaterally. OP clear. No thyromegaly noted.  CV: RRR with no murmurs appreciated Pulm: NWOB, CTAB with no crackles, wheezes, or rhonchi GI: Normal bowel sounds present. Soft, Nontender, Nondistended. MSK: no edema, cyanosis, or clubbing noted Skin: warm, dry Neuro: CN2-12 grossly intact. Strength 5/5 in upper and lower extremities. Reflexes symmetric and intact bilaterally.  Psych: Normal affect and thought content     Kristilyn Coltrane M. [name], MD 08/31/2020 9:48 AM

## 2020-09-01 NOTE — Progress Notes (Signed)
Please inform patient of the following:  Her "bad" cholesterol was mildly elevated but everything else is NORMAL. Her pregnancy test is negative. Do not need to make any changes to her treatment plan at this time. We can recheck in a year.  Joyce Gray. [name], MD 09/01/2020 8:48 AM

## 2020-09-03 ENCOUNTER — Other Ambulatory Visit: Payer: Self-pay | Admitting: Nurse Practitioner

## 2020-09-03 ENCOUNTER — Encounter: Payer: Self-pay | Admitting: Nurse Practitioner

## 2020-09-03 ENCOUNTER — Ambulatory Visit: Payer: BC Managed Care – PPO | Admitting: Nurse Practitioner

## 2020-09-03 ENCOUNTER — Other Ambulatory Visit: Payer: Self-pay

## 2020-09-03 VITALS — BP 118/78

## 2020-09-03 DIAGNOSIS — Z30018 Encounter for initial prescription of other contraceptives: Secondary | ICD-10-CM

## 2020-09-03 DIAGNOSIS — Z113 Encounter for screening for infections with a predominantly sexual mode of transmission: Secondary | ICD-10-CM

## 2020-09-03 MED ORDER — PHEXXI 1.8-1-0.4 % VA GEL: 1.0000 "application " | VAGINAL | 2 refills | Status: DC | PRN

## 2020-09-03 NOTE — Progress Notes (Signed)
   Acute Office Visit  Subjective:    Patient ID: Joyce Gray, female    DOB: [birth year], 45 y.o.   MRN: [id number]   HPI 45 y.o. presents today for STD screening and to discuss non-hormonal contraception. She denies symptoms. She has a new partner.   Review of Systems  Constitutional: Negative.   Genitourinary: Negative.        Objective:    Physical Exam Constitutional:      Appearance: Normal appearance.  Genitourinary:    General: Normal vulva.     Vagina: Normal.     Cervix: Normal.     BP 118/78   LMP 08/13/2020  Wt Readings from Last 3 Encounters:  08/31/20 216 lb 9.6 oz (98.2 kg)  06/22/20 218 lb 6.4 oz (99.1 kg)  03/24/20 220 lb (99.8 kg)        Assessment & Plan:   Problem List Items Addressed This Visit   None   Visit Diagnoses    Screen for STD (sexually transmitted disease)    -  Primary   Relevant Orders   RPR   HIV Antibody (routine testing w rflx)   SURESWAB CT/NG/T. vaginalis   Encounter for initial prescription of other contraceptives       Relevant Medications   Lactic Ac-Citric Ac-Pot Bitart (PHEXXI) 1.8-1-0.4 % GEL     Plan: She would like to try Phexxi. Written and verbal education provided on proper use. She is aware this may not be covered by insurance and will also try Good Rx. If too expensive she does not want to try any hormonal contraception and will use condoms. Gonorrhea/Chlamydia/trich, HIV, RPR pending.      Tamela Gammon DNP, 9:28 AM 09/03/2020

## 2020-09-03 NOTE — Patient Instructions (Signed)
Lactic acid; Citric acid; Potassium bitartrate vaginal gel What is this medicine? LACTIC ACID; CITRIC ACID; POTASSIUM BITARTRATE (LAK tuhk AS id; SIH trik AS id; poe TASS ee um bi TAAR treit) is used for birth control when you have sexual intercourse to help prevent pregnancy. This medicine may be used for other purposes; ask your health care provider or pharmacist if you have questions. COMMON BRAND NAME(S): PHEXXI What should I tell my health care provider before I take this medicine? They need to know if you have any of these conditions:  frequent kidney or urinary tract infections  HIV or AIDS  pelvic or vaginal infection  sexually transmitted disease, like herpes, gonorrhea, or chlamydia  an unusual or allergic reaction to lactic acid, citric acid, potassium bitartrate, other medicines, foods, dyes, or preservatives  pregnant or trying to get pregnant  breast-feeding How should I use this medicine? This medicine is for use in the vagina only. Follow the directions on the prescription label. Wash hands before and after use. To prevent pregnancy, it is very important that this product is used properly. This product must be applied before sexual contact begins. Do not use it in the rectum. Make sure you carefully read and follow the instructions that come with the product. If you have vaginal sex more than 1 time within 1 hour, you must use a new applicator. Use exactly as directed. Talk to your pediatrician regarding the use of this medicine in children. Special care may be needed. Overdosage: If you think you have taken too much of this medicine contact a poison control center or emergency room at once. NOTE: This medicine is only for you. Do not share this medicine with others. What if I miss a dose? Use before each time you have sexual intercourse as directed on the label. If you miss a dose, you may become pregnant. What may interact with this medicine? Interactions are not  expected. This birth control may be used with other medicines used in the vagina to treat infections including miconazole, metronidazole, and tioconazole. If you have questions ask your doctor or pharmacist. Do not use any other vaginal products at the same time without talking to your health care professional. This list may not describe all possible interactions. Give your health care provider a list of all the medicines, herbs, non-prescription drugs, or dietary supplements you use. Also tell them if you smoke, drink alcohol, or use illegal drugs. Some items may interact with your medicine. What should I watch for while using this medicine? This product does not protect you against HIV infection (AIDS) or other sexually transmitted diseases. This product may be used at any time of your menstrual cycle. This product may be used as soon as your healthcare provider tells you it is safe for you to have sex after childbirth, abortion, or miscarriage. This product may be used with most hormonal birth control methods; a vaginal diaphragm; or latex, polyurethane and polyisoprene condoms. Avoid using this product with contraceptive vaginal rings. What side effects may I notice from receiving this medicine? Side effects that you should report to your doctor or health care professional as soon as possible:  allergic reactions like skin rash, itching or hives, swelling  signs and symptoms of infection like fever; chills; pelvic pain; cloudy urine; pain or trouble passing urine  vaginal discharge, itching or odor  vaginal irritation or burning Side effects that usually do not require medical attention (report these to your doctor or health care professional  if they continue or are bothersome):  mild vaginal irritation This list may not describe all possible side effects. Call your doctor for medical advice about side effects. You may report side effects to FDA at 1-800-FDA-1088. Where should I keep my  medicine? Keep out of the reach of children. Store at room temperature between 15 and 30 degrees C (59 and 86 degrees F). Keep in the foil pouch until ready for use. Follow the directions on the product label. Throw away any unused medicine after the expiration date. NOTE: This sheet is a summary. It may not cover all possible information. If you have questions about this medicine, talk to your doctor, pharmacist, or health care provider.  2021 Elsevier/Gold Standard (2018-10-18 09:34:36)

## 2020-09-04 LAB — SURESWAB CT/NG/T. VAGINALIS
C. trachomatis RNA, TMA: NOT DETECTED
N. gonorrhoeae RNA, TMA: NOT DETECTED
Trichomonas vaginalis RNA: NOT DETECTED

## 2020-09-04 LAB — RPR: RPR Ser Ql: NONREACTIVE

## 2020-09-04 LAB — HIV ANTIBODY (ROUTINE TESTING W REFLEX): HIV 1&2 Ab, 4th Generation: NONREACTIVE

## 2020-09-11 NOTE — Telephone Encounter (Signed)
PA done via cover my meds. Will wait for response from [facility].

## 2020-09-18 NOTE — Telephone Encounter (Signed)
Medication denied by insurance patient will need to try and fail 3 formulary drugs such as birth control pills, Threasa Heads, Mirena IUD, [place]

## 2020-10-27 ENCOUNTER — Other Ambulatory Visit: Payer: BC Managed Care – PPO

## 2020-12-10 ENCOUNTER — Encounter (HOSPITAL_BASED_OUTPATIENT_CLINIC_OR_DEPARTMENT_OTHER): Payer: Self-pay | Admitting: Obstetrics & Gynecology

## 2020-12-22 ENCOUNTER — Encounter: Payer: Self-pay | Admitting: Family Medicine

## 2020-12-22 ENCOUNTER — Ambulatory Visit: Payer: BC Managed Care – PPO | Admitting: Family Medicine

## 2020-12-22 VITALS — Temp 98.4°F | Wt 221.0 lb

## 2020-12-22 DIAGNOSIS — H5789 Other specified disorders of eye and adnexa: Secondary | ICD-10-CM | POA: Diagnosis not present

## 2020-12-22 DIAGNOSIS — H02843 Edema of right eye, unspecified eyelid: Secondary | ICD-10-CM | POA: Diagnosis not present

## 2020-12-22 MED ORDER — ERYTHROMYCIN 5 MG/GM OP OINT: 1.0000 "application " | TOPICAL_OINTMENT | Freq: Three times a day (TID) | OPHTHALMIC | 0 refills | Status: DC

## 2020-12-22 NOTE — Progress Notes (Signed)
Virtual Visit via Video Note  I connected with Joyce Gray  on 12/22/20 at 10:00 AM EDT by a video enabled telemedicine application and verified that I am speaking with the correct person using two identifiers.  Location patient: home, [place] Location provider:work or home office Persons participating in the virtual visit: patient, provider  I discussed the limitations of evaluation and management by telemedicine and the availability of in person appointments. The patient expressed understanding and agreed to proceed.   HPI:  Acute telemedicine visit for for eyelid irritation: -Onset: 2 days ago -Symptoms include: R eye feels irritated, swelling of the upper eyelid, itchy, today had a little bit of drainage -Denies:any new exposures or new insect bites, vision loss, eyeball pain, headaches, fevers, malaise, NV -Pertinent past medical history:has had the same about 1 year ago and was given abx ointment which resolved the issue -Pertinent medication allergies: No Known Allergies  ROS: See pertinent positives and negatives per HPI.  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 Glucosamine 1500 mg & Chondroitin 120 mg, Disp: , Rfl:    Multiple Vitamin (MULTIVITAMIN) capsule, Take 1 capsule by mouth daily., Disp: , Rfl:    OLIVE LEAF EXTRACT PO, Take by mouth daily., Disp: , Rfl:    ondansetron (ZOFRAN ODT) 8 MG disintegrating  tablet, Take 1 tablet (8 mg total) by mouth every 8 (eight) hours as needed for nausea or vomiting., Disp: 20 tablet, Rfl: 0   traZODone (DESYREL) 50 MG tablet, Take 0.5-1 tablets (25-50 mg total) by mouth at bedtime as needed for sleep., Disp: 30 tablet, Rfl: 3  EXAM:  VITALS per patient if applicable:  GENERAL: alert, oriented, appears well and in no acute distress  HEENT: atraumatic, conjunttiva clear, mild edema and erythema of the upper eyelid with central papule, no purulence or drainage noted at the time of this video visit exam, PER, EOMI, the no obvious abnormalities on inspection of external nose and ears  NECK: normal movements of the head and neck  LUNGS: on inspection no signs of respiratory distress, breathing rate appears normal, no obvious gross SOB, gasping or wheezing  CV: no obvious cyanosis  MS: moves all visible extremities without noticeable abnormality  PSYCH/NEURO: pleasant and cooperative, no obvious depression or anxiety, speech and thought processing grossly intact  ASSESSMENT AND PLAN:  Discussed the following assessment and plan:  Edema of right eyelid  Eye drainage  -we discussed possible serious and likely etiologies, options for evaluation and workup, limitations of telemedicine visit vs in person visit, treatment, treatment risks and precautions. Pt prefers to treat via telemedicine empirically rather than in person at this moment. She appear to have a stye from what I can see on this video visit. Because of the reported drainage with Rx erythro oint and advised compresses, no contacts until healed, new  eye makeup if used. Advised to seek prompt in person care if worsening, new symptoms arise, or if is not improving with treatment. She reports she has an appointment with her eye doctor next week. Discussed options for inperson care if PCP office not available. Did let this patient know that I only do telemedicine on Tuesdays and Thursdays for [place].  Advised to schedule follow up visit with PCP or [facility] if any further questions or concerns to avoid delays in care.   I discussed the assessment and treatment plan with the patient. The patient was provided an opportunity to ask questions and all were answered. The patient agreed with the plan and demonstrated an understanding of the instructions.     [name], DO

## 2020-12-22 NOTE — Patient Instructions (Signed)
-  I sent the medication(s) we discussed to your pharmacy: Meds ordered this encounter  Medications   erythromycin ophthalmic ointment    Sig: Place 1 application into the right eye 3 (three) times daily.    Dispense:  3.5 g    Refill:  0     I hope you are feeling better soon!  Seek in person care promptly if your symptoms worsen, new concerns arise or you are not improving with treatment.  It was nice to meet you today. I help Omer out with telemedicine visits on Tuesdays and Thursdays and am available for visits on those days. If you have any concerns or questions following this visit please schedule a follow up visit with your Primary Care doctor or seek care at a local urgent care clinic to avoid delays in care.

## 2021-04-06 ENCOUNTER — Other Ambulatory Visit: Payer: Self-pay

## 2021-04-06 ENCOUNTER — Encounter: Payer: Self-pay | Admitting: Nurse Practitioner

## 2021-04-06 ENCOUNTER — Ambulatory Visit (INDEPENDENT_AMBULATORY_CARE_PROVIDER_SITE_OTHER): Payer: BC Managed Care – PPO | Admitting: Nurse Practitioner

## 2021-04-06 ENCOUNTER — Other Ambulatory Visit (HOSPITAL_COMMUNITY)
Admission: RE | Admit: 2021-04-06 | Discharge: 2021-04-06 | Disposition: A | Discharge status: Home / Self Care | Payer: BC Managed Care – PPO | Source: Ambulatory Visit | Attending: Nurse Practitioner | Admitting: Nurse Practitioner

## 2021-04-06 VITALS — BP 124/80 | Ht 68.5 in | Wt 227.0 lb

## 2021-04-06 DIAGNOSIS — Z01419 Encounter for gynecological examination (general) (routine) without abnormal findings: Secondary | ICD-10-CM | POA: Insufficient documentation

## 2021-04-06 NOTE — Progress Notes (Signed)
   Joyce Gray April 23, 1976 [id number]   History:  45 y.o. G0 presents for annual exam. Monthly cycles. Uterine fibroids. 1999 cryosurgery, 2016 ASCUS pap with +HR HPV , 2020 normal cytology with +HR HPV 2020 (neg 16/18/45).   Gynecologic History Patient's last menstrual period was 03/31/2021. Period Cycle (Days): 28 Period Duration (Days): 6 Period Pattern: Regular Menstrual Flow: Heavy Dysmenorrhea: (!) Moderate Dysmenorrhea Symptoms: Cramping Contraception/Family planning: abstinence Sexually active: Not for a couple of months  Health Maintenance Last Pap: 03/24/2020. Results were: Normal Last mammogram: 12/04/2020. Results were: Normal Last colonoscopy: Not indicated Last Dexa: Not indicated  Past medical history, past surgical history, family history and social history were all reviewed and documented in the EPIC chart. 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   ROS:  A ROS was performed and pertinent positives and negatives are included.  Exam:  Vitals:   04/06/21 1421  BP: 124/80  Weight: 227 lb (103 kg)  Height: 5' 8.5" (1.74 m)   Body mass index is 34.01 kg/m.  General appearance:  Normal Thyroid:  Symmetrical, normal in size, without palpable masses or nodularity. Respiratory  Auscultation:  Clear without wheezing or rhonchi Cardiovascular  Auscultation:  Regular rate, without rubs, murmurs or gallops  Edema/varicosities:  Not grossly evident Abdominal  Soft,nontender, without masses, guarding or rebound.  Liver/spleen:  No organomegaly noted  Hernia:  None appreciated  Skin  Inspection:  Grossly normal Breasts: Examined lying and sitting.   Right: Without masses, retractions, nipple discharge or axillary adenopathy.   Left: Without masses, retractions, nipple discharge or axillary adenopathy. Genitourinary   Inguinal/mons:  Normal without inguinal adenopathy  External genitalia:  Normal appearing vulva with no masses, tenderness, or lesions  BUS/Urethra/Skene's glands:   Normal  Vagina:  Normal appearing with normal color and discharge, no lesions  Cervix:  Normal appearing without discharge or lesions  Uterus:  Normal in size, shape and contour.  Midline and mobile, nontender  Adnexa/parametria:     Rt: Normal in size, without masses or tenderness.   Lt: Normal in size, without masses or tenderness.  Anus and perineum: Normal  Patient informed chaperone available to be present for breast and pelvic exam. Patient has requested no chaperone to be present. Patient has been advised what will be completed during breast and pelvic exam.   Assessment/Plan:  45 y.o. G0 for annual exam.   Well female exam with routine gynecological exam - Plan: Cytology - PAP( [facility]). Education provided on SBEs, importance of preventative screenings, current guidelines, high calcium diet, regular exercise, and multivitamin daily.  Labs with PCP.   Screening for cervical cancer - 2016 ASCUS pap with +HR HPV , 2020 normal cytology with +HR HPV 2020 (neg 16/18/45). 2021 normal. Pap with HR HPV today.   Screening for breast cancer - Normal mammogram history.  Continue annual screenings.  Normal breast exam today.  Screening for colon cancer - Discussed current guidelines and recommendations to start screenings at age 20.   Return in 1 year for annual.   Tamela Gammon DNP, 2:41 PM 04/06/2021

## 2021-04-13 LAB — CYTOLOGY - PAP
Chlamydia: NEGATIVE
Comment: NEGATIVE
Comment: NEGATIVE
Comment: NEGATIVE
Comment: NORMAL
Diagnosis: NEGATIVE
High risk HPV: POSITIVE — AB
Neisseria Gonorrhea: NEGATIVE
Trichomonas: NEGATIVE

## 2021-04-21 ENCOUNTER — Encounter: Payer: BC Managed Care – PPO | Admitting: Psychiatry

## 2021-04-21 ENCOUNTER — Ambulatory Visit: Payer: BC Managed Care – PPO | Admitting: Psychiatry

## 2021-04-23 ENCOUNTER — Other Ambulatory Visit: Payer: Self-pay

## 2021-04-23 ENCOUNTER — Ambulatory Visit (INDEPENDENT_AMBULATORY_CARE_PROVIDER_SITE_OTHER): Payer: BC Managed Care – PPO | Admitting: Psychiatry

## 2021-04-23 DIAGNOSIS — R69 Illness, unspecified: Secondary | ICD-10-CM | POA: Diagnosis not present

## 2021-04-23 DIAGNOSIS — F4323 Adjustment disorder with mixed anxiety and depressed mood: Secondary | ICD-10-CM

## 2021-04-23 DIAGNOSIS — F4010 Social phobia, unspecified: Secondary | ICD-10-CM

## 2021-04-23 NOTE — Progress Notes (Signed)
PROBLEM-FOCUSED INITIAL PSYCHOTHERAPY EVALUATION Joyce Moore, PhD LP [facility]  Name: Joyce Gray Date: 04/23/2021 Time spent: 78 min MRN: [id number] DOB: 1975-12-06 Guardian/Payee: self  PCP: Vivi Barrack, MD Documentation requested on this visit: No  PROBLEM HISTORY Reason for Visit /Presenting Problem:  Chief Complaint  Patient presents with   Establish Care   Anxiety   Depression    Narrative/History of Present Illness Referred by former patient for treatment of anxiety, self-esteem, social anxiety.  PT reports rough breakup a month and a half ago, a man she met online, in [place], had declared exclusive but figured out he was contacting others.  Hard to accept, has been afraid she's alone for the rest of her life and forever childless.  Has felt down, blue, reluctant to socialize, overeating, trouble sleeping.  Wants particularly to unburden, cope with anxiety, reframe her anxiety, beat blue feelings.    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  Wanted to work on it without medicine, moved back locally, parents required she try medicine, so she has been on Lexapro for a while, 43m.    [facility], secure, and does enjoy her work well enough.  More laid back generally, though some friction with a principal who seems to micromanage, make impulsive decisions in her opinion, including several times interrupting class to deliver information, others where she seemed to "play" critical in a way MChristoncouldn't tell if she was serious.  Still somewhat gAntionette Poles 11 years in current school, 2nd year for current principal, and some context of anxiety in students and other teachers, enough that some students drafted a petition to have principal fired.  Though principal seems a little young in her outlook, she is  3 years to retirement.  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  Mother has strong tendencies to dump about father, and sometimes father will dump about mother, not as much.    Prior Psychiatric Assessment/Treatment:   Outpatient treatment: Hx with EDoroteo Glassman retired Psychiatric hospitalization: none stated Psychological assessment/testing: none stated   Abuse/neglect screening: Victim of abuse: Not assessed at this time / none suspected.   Victim of neglect: Not assessed at this time / none suspected.   Perpetrator of abuse/neglect: Not assessed at this time / none suspected.   Witness / Exposure to Domestic Violence: Not assessed at this time / none suspected.   Witness to Community Violence:  Not assessed at this time / none suspected.   Protective Services Involvement: No.   Report needed: No.    Substance abuse screening: Current substance abuse: Not assessed at this time / none suspected.   History of impactful substance use/abuse: Not assessed at this time / none suspected.     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  Of note for psychotherapy at this time are  chronically fatiguing illnesses. 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 Glucosamine 1500 mg & Chondroitin 120 mg     Multiple Vitamin (MULTIVITAMIN) capsule Take 1 capsule by mouth daily.     traZODone (DESYREL) 50 MG tablet Take 0.5-1 tablets (25-50 mg total) by mouth at bedtime as needed for sleep. 30 tablet 3   No current facility-administered medications for this visit.    MENTAL STATUS AND OBSERVATIONS Appearance:   Casual     Behavior:  Appropriate and deferential  Motor:  Normal  Speech/Language:   Clear and Coherent  Affect:  Appropriate  Mood:  anxious and dysthymic  Thought process:  normal  Thought content:    WNL and worry  Sensory/Perceptual disturbances:    WNL  Orientation:  Fully oriented  Attention:  Good  Concentration:  Good  Memory:  WNL  Fund of knowledge:   Good  Insight:    Good  Judgment:   Good  Impulse Control:  Good   Initial Risk Assessment: Danger to self: No Self-injurious behavior: No Danger to others: No Physical aggression / violence: No Duty to warn: No Access to firearms a concern: No Gang involvement: No Patient / guardian was educated about steps to take if suicide or homicide risk level increases between visits: yes While future psychiatric events cannot be accurately predicted, the patient does not currently require acute inpatient psychiatric care and does not currently meet [facility] involuntary commitment  criteria.   DIAGNOSIS:    ICD-10-CM   1. Adjustment disorder with mixed anxiety and depressed mood  F43.23     2. Social anxiety disorder  F40.10     3. r/o early onset dysthymia  R69       INITIAL TREATMENT: Support/validation provided for distressing symptoms and confirmed rapport Ethical orientation and informed consent confirmed re: privacy rights -- including but not limited to HIPAA, EMR and use of e-PHI patient responsibilities -- scheduling, fair notice of changes, in-person vs. telehealth and regulatory and financial conditions affecting choice expectations for working relationship in psychotherapy needs and consents for working partnerships and exchange of information with other health care providers, especially any medication and other behavioral health providers Initial orientation to cognitive-behavioral and solution-focused therapy approach Psychoeducation and initial recommendations: Support for breakup and disappointment Validated difficulty of worrying what others think Validated right to pursue a suitable relationship and right to decline one that is dishonest, unfair, or potentially demeaning Outlook for therapy -- scheduling constraints, availability of crisis service, inclusion of family member(s) as appropriate  Plan: Option to get back on dating app, see who else is available Maintain support system Will work on assertiveness in work situation as available and appropriate Maintain medication as prescribed and work faithfully with  relevant prescriber(s) if any changes are desired or seem indicated Call the clinic on-call service, present to ER, or call 911 if any life-threatening psychiatric crisis Return for time as available.  [name], PhD  Joyce Moore, PhD LP Clinical Psychologist, [facility] [address] [place], [place] [address] [phone]

## 2021-06-10 ENCOUNTER — Other Ambulatory Visit: Payer: Self-pay

## 2021-06-10 ENCOUNTER — Ambulatory Visit (INDEPENDENT_AMBULATORY_CARE_PROVIDER_SITE_OTHER): Payer: BC Managed Care – PPO | Admitting: Psychiatry

## 2021-06-10 DIAGNOSIS — F4010 Social phobia, unspecified: Secondary | ICD-10-CM

## 2021-06-10 DIAGNOSIS — F4323 Adjustment disorder with mixed anxiety and depressed mood: Secondary | ICD-10-CM

## 2021-06-10 DIAGNOSIS — R69 Illness, unspecified: Secondary | ICD-10-CM

## 2021-06-10 NOTE — Progress Notes (Unsigned)
Psychotherapy Progress Note [facility] [name], PhD LP  Patient ID: Joyce Gray [facility]    MRN: [id number] Therapy format: Individual psychotherapy Date: 06/10/2021      Start: ***:***     Stop: ***:***     Time Spent: *** min Location: In-person   Session narrative (presenting needs, interim history, self-report of stressors and symptoms, applications of prior therapy, status changes, and interventions made in session) 6 weeks since first seen, noted for depressive adjustment to losing a relationship (discovered nonexclusive), anxiety dealing with her principal, more remote history of panic attacks and temptations to paranoia/personalization, and family history of being made mother's confidante for disaffection with father.  Substantial hx previous therapy Joyce Glassman, MA, and EFT work.    Feeling blindsided at work, responding to some series of urgent matters.  One child she thinks she may have to make DSS report about (parental substance abuse).  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  Some empathic stress hearing from a complaining colleague.  Other stress from an alarmist female colleague Joyce Gray) mass emailing about a student attendance issue.    Back in touch with Joyce Gray, tentatively.  Mostly text and video call, one in-person meeting, with one planned.  Working   Uses Joyce Gray' Feeling Good to error-check automatic thouhts sometimes.  Helpful self-admnistered CBT.    Therapeutic modalities: {AM:23362::"Cognitive Behavioral Therapy","Solution-Oriented/Positive Psychology"}  Mental Status/Observations:  Appearance:   {PSY:22683}     Behavior:  {PSY:21022743}  Motor:  {PSY:22302}  Speech/Language:   {PSY:22685}  Affect:  {PSY:22687}  Mood:  {PSY:31886}  Thought process:  {PSY:31888}  Thought content:    {PSY:671-843-4745}  Sensory/Perceptual  disturbances:    {PSY:508-618-1030}  Orientation:  {Psych Orientation:23301::"Fully oriented"}  Attention:  {Good-Fair-Poor ratings:23770::"Good"}    Concentration:  {Good-Fair-Poor ratings:23770::"Good"}  Memory:  {PSY:870-375-6704}  Insight:    {Good-Fair-Poor ratings:23770::"Good"}  Judgment:   {Good-Fair-Poor ratings:23770::"Good"}  Impulse Control:  {Good-Fair-Poor ratings:23770::"Good"}   Risk Assessment: Danger to Self: {Risk:22599::"No"} Self-injurious Behavior: {Risk:22599::"No"} Danger to Others: {Risk:22599::"No"} Physical Aggression / Violence: {Risk:22599::"No"} Duty to Warn: {AMYesNo:22526::"No"} Access to Firearms a concern: {AMYesNo:22526::"No"}  Assessment of progress:  {Progress:22147::"progressing"}  Diagnosis: No diagnosis found. Plan:  *** Practice self encouragement -- imagine a mentor shadowing and noticing good work Research scientist (life sciences) as may be noted above Continue to utilize previously learned skills ad lib Maintain medication as prescribed and work faithfully with relevant prescriber(s) if any changes are desired or seem indicated Call the clinic on-call service, 988/hotline, 911, or present to [facility] or ER if any life-threatening psychiatric crisis No follow-ups on file. Already scheduled visit in this office 06/30/2021.  Joyce Serve, PhD [name], PhD LP Clinical Psychologist, [facility] [address], [place], [place] [address] [phone]

## 2021-06-30 ENCOUNTER — Ambulatory Visit (INDEPENDENT_AMBULATORY_CARE_PROVIDER_SITE_OTHER): Payer: BC Managed Care – PPO | Admitting: Psychiatry

## 2021-06-30 ENCOUNTER — Other Ambulatory Visit: Payer: Self-pay

## 2021-06-30 DIAGNOSIS — F4010 Social phobia, unspecified: Secondary | ICD-10-CM

## 2021-06-30 DIAGNOSIS — F4323 Adjustment disorder with mixed anxiety and depressed mood: Secondary | ICD-10-CM | POA: Diagnosis not present

## 2021-06-30 NOTE — Progress Notes (Signed)
Psychotherapy Progress Note [facility] 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  Some pressure to complete in the next 2 years at this stage, benefit of bonus pay.  Has a study buddy, which helps.  Projects will include some selected students' work, how she framed lesson plans, and how she adapts them for noted students.  Alternate idea to go to grad school, elevate to college level teaching, but it's a big change and investment.  Has bought a car, after returning a first choice that turned out rusty, from [place], and by way of successfully resisting sales tactics for add-ons.  Affirmed handling of fairly smarmy sales tactics trying to capitalize on stereotypically female fears.  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  Sorted out feelings and messages in addressing this with her principal, including uncharacteristically blurting out "That's illegal!"  Affirmed how she promptly researched her claim and walked it back soon after, the grace her principal showed recognized a distraught moment, and how well Joyce Gray has already established herself as kind and fairminded enough to get that.  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  Compared/contrasted values for clarity in  asserting herself and balancing needs of the individual vs. the system, and affirmed she surely has not in any way embarrassed herself or damaged her standing.  Precancerous mole removed today.  Mild worry over that but more about postop pain.    Therapeutic modalities: Cognitive Behavioral Therapy, Solution-Oriented/Positive Psychology, and Assertiveness/Communication  Mental Status/Observations:  Appearance:   Casual     Behavior:  Appropriate  Motor:  Normal  Speech/Language:   Clear and Coherent  Affect:  Appropriate  Mood:  normal and mild anx  Thought process:  normal  Thought content:    WNL  Sensory/Perceptual disturbances:    WNL  Orientation:  Fully oriented  Attention:  Good    Concentration:  Good  Memory:  WNL  Insight:    Good  Judgment:   Good  Impulse Control:  Good   Risk Assessment: Danger to Self: No Self-injurious Behavior: No Danger to Others: No Physical Aggression / Violence: No Duty to Warn: No Access to Firearms a concern: No  Assessment of progress:  progressing  Diagnosis:   ICD-10-CM   1. Adjustment disorder with mixed anxiety and depressed mood  F43.23     2. Social anxiety disorder  F40.10      Plan:  Self-affirm good assertiveness, no real chance of coming across ugly, people who know her would know she means well, and main effort going forward is to state feelings and wishes more than judgments Next time return to relationship concerns Joyce Gray) Other recommendations/advice as may be noted above Continue to utilize previously learned skills ad lib Maintain medication as prescribed and work  faithfully with relevant prescriber(s) if any changes are desired or seem indicated Call the clinic on-call service, 988/hotline, 911, or present to [facility] or ER if any life-threatening psychiatric crisis Return for session(s) already scheduled. Already scheduled visit in this office 07/21/2021.  [name], PhD Joyce Moore, PhD LP Clinical  Psychologist, [facility] [address], [place], [place] [address] [phone]

## 2021-07-21 ENCOUNTER — Ambulatory Visit (INDEPENDENT_AMBULATORY_CARE_PROVIDER_SITE_OTHER): Payer: BC Managed Care – PPO | Admitting: Psychiatry

## 2021-07-21 ENCOUNTER — Other Ambulatory Visit: Payer: Self-pay

## 2021-07-21 DIAGNOSIS — F4323 Adjustment disorder with mixed anxiety and depressed mood: Secondary | ICD-10-CM | POA: Diagnosis not present

## 2021-07-21 DIAGNOSIS — R69 Illness, unspecified: Secondary | ICD-10-CM

## 2021-07-21 DIAGNOSIS — F4010 Social phobia, unspecified: Secondary | ICD-10-CM

## 2021-07-21 NOTE — Progress Notes (Unsigned)
Psychotherapy Progress Note [facility] [name], PhD LP  Patient ID: Joyce Gray [facility])    MRN: [id number] Therapy format: {Therapy Types:21967::"Individual psychotherapy"} Date: 07/21/2021      Start: ***:***     Stop: ***:***     Time Spent: *** min Location: {SvcLoc:22530::"In-person"}   Session narrative (presenting needs, interim history, self-report of stressors and symptoms, applications of prior therapy, status changes, and interventions made in session) Reveals hx of COVID and 6 months to get taste/smell back.   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  Was going hard with it a couple weeks but now has pulled back.  Concluded here it's humane, and OK, to do so.  She doesn't have to   Tried a 5-minute meditation during her planning period a couple weeks ago -- vivid benefits in feeling more peaceful and up to coping with   Re. colleague Thayer Jew, seeing it work out that he doesn't hold it against her that she suspected him of changing grades.  Been able to still talk together.    Review of session, good to hear validation of her existential anxiety, acknowledge diagnosis, validate  And decatastrophize the issue   Therapeutic modalities: {AM:23362::"Cognitive Behavioral Therapy","Solution-Oriented/Positive Psychology"}  Mental Status/Observations:  Appearance:   {PSY:22683}     Behavior:  {PSY:21022743}  Motor:  {PSY:22302}  Speech/Language:   {PSY:22685}  Affect:  {PSY:22687}  Mood:  {PSY:31886}  Thought process:  {PSY:31888}  Thought content:    {PSY:715 863 0100}  Sensory/Perceptual disturbances:    {PSY:858-781-7420}  Orientation:  {Psych Orientation:23301::"Fully oriented"}  Attention:  {Good-Fair-Poor ratings:23770::"Good"}     Concentration:  {Good-Fair-Poor ratings:23770::"Good"}  Memory:  {PSY:340-793-3757}  Insight:    {Good-Fair-Poor ratings:23770::"Good"}  Judgment:   {Good-Fair-Poor ratings:23770::"Good"}  Impulse Control:  {Good-Fair-Poor ratings:23770::"Good"}   Risk Assessment: Danger to Self: {Risk:22599::"No"} Self-injurious Behavior: {Risk:22599::"No"} Danger to Others: {Risk:22599::"No"} Physical Aggression / Violence: {Risk:22599::"No"} Duty to Warn: {AMYesNo:22526::"No"} Access to Firearms a concern: {AMYesNo:22526::"No"}  Assessment of progress:  {Progress:22147::"progressing"}  Diagnosis: No diagnosis found. Plan:  *** Other recommendations/advice as may be noted above Continue to utilize previously learned skills ad lib Maintain medication as prescribed and work faithfully with relevant prescriber(s) if any changes are desired or seem indicated Call the clinic on-call service, 988/hotline, 911, or present to [facility] or ER if any life-threatening psychiatric crisis No follow-ups on file. Already scheduled visit in this office 08/04/2021.  [name], PhD [name], PhD LP Clinical Psychologist, [facility] [address] [place], [place] [address] [phone]

## 2021-08-04 ENCOUNTER — Ambulatory Visit: Payer: BC Managed Care – PPO | Admitting: Psychiatry

## 2021-08-04 ENCOUNTER — Other Ambulatory Visit: Payer: Self-pay

## 2021-08-04 DIAGNOSIS — F4010 Social phobia, unspecified: Secondary | ICD-10-CM

## 2021-08-04 DIAGNOSIS — Z62820 Parent-biological child conflict: Secondary | ICD-10-CM

## 2021-08-04 DIAGNOSIS — R69 Illness, unspecified: Secondary | ICD-10-CM

## 2021-08-04 DIAGNOSIS — F4323 Adjustment disorder with mixed anxiety and depressed mood: Secondary | ICD-10-CM | POA: Diagnosis not present
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
## 2021-08-18 ENCOUNTER — Ambulatory Visit: Payer: BC Managed Care – PPO | Admitting: Psychiatry

## 2021-08-18 DIAGNOSIS — Z62820 Parent-biological child conflict: Secondary | ICD-10-CM | POA: Diagnosis not present

## 2021-08-18 DIAGNOSIS — F4323 Adjustment disorder with mixed anxiety and depressed mood: Secondary | ICD-10-CM

## 2021-08-18 DIAGNOSIS — F4010 Social phobia, unspecified: Secondary | ICD-10-CM | POA: Diagnosis not present

## 2021-08-18 DIAGNOSIS — R69 Illness, unspecified: Secondary | ICD-10-CM
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
## 2021-09-01 ENCOUNTER — Ambulatory Visit: Payer: BC Managed Care – PPO | Admitting: Psychiatry

## 2021-09-15 ENCOUNTER — Ambulatory Visit: Payer: BC Managed Care – PPO | Admitting: Psychiatry

## 2021-09-29 ENCOUNTER — Ambulatory Visit (INDEPENDENT_AMBULATORY_CARE_PROVIDER_SITE_OTHER): Payer: BC Managed Care – PPO | Admitting: Psychiatry

## 2021-09-29 DIAGNOSIS — F4010 Social phobia, unspecified: Secondary | ICD-10-CM

## 2021-09-29 DIAGNOSIS — F4323 Adjustment disorder with mixed anxiety and depressed mood: Secondary | ICD-10-CM

## 2021-09-29 DIAGNOSIS — Z62820 Parent-biological child conflict: Secondary | ICD-10-CM

## 2021-09-29 DIAGNOSIS — R69 Illness, unspecified: Secondary | ICD-10-CM
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
## 2021-10-15 ENCOUNTER — Ambulatory Visit: Payer: BC Managed Care – PPO | Admitting: Psychiatry

## 2021-10-15 DIAGNOSIS — F4010 Social phobia, unspecified: Secondary | ICD-10-CM | POA: Diagnosis not present

## 2021-10-15 DIAGNOSIS — F4323 Adjustment disorder with mixed anxiety and depressed mood: Secondary | ICD-10-CM

## 2021-10-15 DIAGNOSIS — Z62820 Parent-biological child conflict: Secondary | ICD-10-CM

## 2021-10-15 DIAGNOSIS — R69 Illness, unspecified: Secondary | ICD-10-CM | POA: Diagnosis not present

## 2021-10-15 NOTE — Progress Notes (Signed)
Psychotherapy Progress Note [facility] [name], PhD LP  Patient ID: Joyce Gray [facility])    MRN: [id number] Therapy format: Individual psychotherapy Date: 10/15/2021      Start: 4:27p     Stop: 5:14p     Time Spent: 47 min Location: In-person   Session narrative (presenting needs, interim history, self-report of stressors and symptoms, applications of prior therapy, status changes, and interventions made in session) Glad to see school year wrapping up.  Exams in progress.  Turned in her [employer] component (portfolio) 2 wks ago, confident of good feedback.  Principal came through supportively about dealing with her requirements and conflicted thinking and helped her realize she has time and opportunity.  Good feedback from colleagues on what she's written up.  Still stress expected with exams and submissions to come, but 75% morale for the project.  Some weariness today with a large set of grading to do.  Affirmed and encouraged.  Re. mother, had followup conversation over the tense visit, and she denied ever threatening to call police.  Worry for mother, who seems to be losing steps mentally.  Educated on possibility of alcoholic dementia.    Embroiled with her brother [name] who is effectively disabled but resistant to filing for disability.  With [employer], may make a mission trip of sorts to [place] to see [name] to help and persuade to help.  Discussed possibility of an ACTT team, since he is either bipolar or schizophrenic and has hx of paranoia and going on erratic journeys.  Also hx of marijuana abuse.    Therapeutic modalities: Cognitive Behavioral Therapy and Solution-Oriented/Positive Psychology  Mental Status/Observations:  Appearance:   Casual     Behavior:  Appropriate  Motor:  Normal  Speech/Language:   Clear and Coherent  Affect:  Appropriate  Mood:  anxious and tired , responsive  Thought process:  normal  Thought content:    worry   Sensory/Perceptual disturbances:    WNL  Orientation:  Fully oriented  Attention:  Good    Concentration:  Good  Memory:  WNL  Insight:    Good  Judgment:   Good  Impulse Control:  Good   Risk Assessment: Danger to Self: No Self-injurious Behavior: No Danger to Others: No Physical Aggression / Violence: No Duty to Warn: No Access to Firearms a concern: No  Assessment of progress:  progressing  Diagnosis:   ICD-10-CM   1. Adjustment disorder with mixed anxiety and depressed mood  F43.23     2. Relationship problem between parent and child  Z62.820     3. Social anxiety disorder  F40.10     4. r/o early onset dysthymia  R69      Plan:  Self-affirm positive feedback received from peers and supervisor on her work Possible ACTT team for [employer] -- may inquire with his local DSS Other recommendations/advice as may be noted above Continue to utilize previously learned skills ad lib Maintain medication as prescribed and work faithfully with relevant prescriber(s) if any changes are desired or seem indicated Call the clinic on-call service, 988/hotline, 911, or present to [facility] or ER if any life-threatening psychiatric crisis Return for session(s) already scheduled, recommend scheduling ahead. Already scheduled visit in this office 10/27/2021.  [name], PhD [name], PhD LP Clinical Psychologist, [facility] [address], [place], [place] [address] [phone]

## 2021-10-27 ENCOUNTER — Ambulatory Visit (INDEPENDENT_AMBULATORY_CARE_PROVIDER_SITE_OTHER): Payer: BC Managed Care – PPO | Admitting: Psychiatry

## 2021-10-27 DIAGNOSIS — F4323 Adjustment disorder with mixed anxiety and depressed mood: Secondary | ICD-10-CM

## 2021-10-27 DIAGNOSIS — F4010 Social phobia, unspecified: Secondary | ICD-10-CM

## 2021-10-27 DIAGNOSIS — Z62820 Parent-biological child conflict: Secondary | ICD-10-CM

## 2021-10-27 DIAGNOSIS — R69 Illness, unspecified: Secondary | ICD-10-CM

## 2021-10-27 NOTE — Progress Notes (Signed)
Psychotherapy Progress Note [facility] [name], PhD LP  Patient ID: Joyce Gray [facility])    MRN: [id number] Therapy format: Individual psychotherapy Date: 10/27/2021      Start: 6:18p     Stop: 7:04p     Time Spent: 46 min Location: In-person   Session narrative (presenting needs, interim history, self-report of stressors and symptoms, applications of prior therapy, status changes, and interventions made in session) Last day of exams today, more casual day, only classes of students who were there on requirement.  Able to have some fun with them.    Recently dealt with mother making a weird, urgent request she go look at a house, in the midst of a load of grading; turned out she was angling to get Coralee to buy the house and give her rights to live there.  Sought support with father, worried, and on his advice messaged M that she couldn't wouldn't.  On further advice of father, texted mother to call off communicating altogether and blocked her number.  Got herself out to a ballroom dancing venue where she successfully changed focus, got social, obtained some companionship, and more or less found a few surrogate parents in the older crowd, who loved having her there.  Later unblocked and left a message with mother.  Acknowledged that it was both cathartic to confront and block her, and right to reopen communication.  History of mother consuming her before with multiple calls, complaints about her, critiques of her choices.  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  Sometime back had unified phone plan with mother but it was too enmeshed, and segregated plans.  M did successfully get her own when challenged, proving she can organize more of her own care than advertised.    Discussed at some length the experience of being manipulated and trained in some fashion to be codependent with mother.  Reframed as instincts both the motivation to take care of mother and the motivation to cut  her off.  Realized regret in telling her she didn't want to have relationship, unblocked and called her back a few days ago, hasn't heard yet.  Also feeling guilty for having broadcast the issue to several relatives.  Contextualized this and resolved she could recontact relatives to make simple apology for dumping, and then be ready to apologize to M -- if it makes sense -- for going harsh temporarily, but still let it be legitimate to have limits what she will/won't do for her, including brook certain conversations.  Therapeutic modalities: Cognitive Behavioral Therapy and Solution-Oriented/Positive Psychology  Mental Status/Observations:  Appearance:   Casual     Behavior:  Appropriate  Motor:  Normal  Speech/Language:   Clear and Coherent  Affect:  Appropriate  Mood:  normal  Thought process:  normal  Thought content:    WNL  Sensory/Perceptual disturbances:    WNL  Orientation:  Fully oriented  Attention:  Good    Concentration:  Good  Memory:  WNL  Insight:    Good  Judgment:   Good  Impulse Control:  Good   Risk Assessment: Danger to Self: No Self-injurious Behavior: No Danger to Others: No Physical Aggression / Violence: No Duty to Warn: No Access to Firearms a concern: No  Assessment of progress:  progressing  Diagnosis:   ICD-10-CM   1. Adjustment disorder with mixed anxiety and depressed mood  F43.23     2. Relationship problem between parent and child  Z62.820  3. Social anxiety disorder  F40.10     4. r/o early onset dysthymia  R69      Plan:  Resolved to make apologies to family suspect made uncomfortable by broadcasting the problem Resolved to offer apology to mother for harshness and reaffirm limits as needed  As needed, may set limits with father on advice how to handle mother -- it is always her prerogative to relate to each as she sees fit Other recommendations/advice as may be noted above Continue to utilize previously learned skills ad  lib Maintain medication as prescribed and work faithfully with relevant prescriber(s) if any changes are desired or seem indicated Call the clinic on-call service, 988/hotline, 911, or present to [facility] or ER if any life-threatening psychiatric crisis Return for time as available. Already scheduled visit in this office 12/10/2021.  [name], PhD [name], PhD LP Clinical Psychologist, [facility] [address], [place], [place] [address] [phone]

## 2021-11-01 ENCOUNTER — Telehealth: Payer: Self-pay | Admitting: Family Medicine

## 2021-11-01 NOTE — Telephone Encounter (Signed)
Patient Name: [facility] Joyce Gray Gender: Female DOB: [birth year] Age: 46 Y 56 M 15 D Return Phone Number: [phone] (Primary), [phone] (Secondary) Address: [place] [address] Client [facility] [place] [facility] [place] [facility] Provider Dimas Chyle- MD Contact Type Call Who Is Calling Patient / Member / Family / Caregiver Call Type Triage / Clinical Relationship To Patient Self Return Phone Number [phone] (Primary) Chief Complaint BREATHING - shortness of breath or sounds breathless Reason for Call Symptomatic / Request for [place] states that she has difficulty breathing. Translation No Nurse Assessment Nurse: Gildardo Pounds, RN, Amy Date/Time (Eastern Time): 11/01/2021 1:10:40 PM Confirm and document reason for call. If symptomatic, describe symptoms. ---Caller states that she has difficulty breathing. She doesn't know if it is anxiety related. She does have a big test coming up soon. She also has heavy periods & might be anemic. It started 3-4 days ago when trying to get to sleep. She has had a scratchy throat & congestion. She thought it was air quality related. Benadryl past couple days. SOB is coming & going. Accompanies a panic feeling here & there. Hx of anxiety. Does the patient have any new or worsening symptoms? ---Yes Joyce a triage be completed? ---Yes Related visit to physician within the last 2 weeks? ---No Does the PT have any chronic conditions? (i.e. diabetes, asthma, this includes High risk factors for pregnancy, etc.) ---Yes List chronic conditions. ---anxiety depression Is the patient pregnant or possibly pregnant? (Ask all females between the ages of 23-55) ---No Is this a behavioral health or substance abuse call? ---No  Guidelines Guideline Title Affirmed Question Affirmed Notes Nurse Date/Time (Eastern Time) Anxiety and  Panic Attack MODERATE anxiety (e.g., persistent or frequent anxiety symptoms; interferes with sleep, school, or work) Recruitment consultant, Therapist, sports, [place] 11/01/2021 1:15:17 PM Disp. Time Eilene Ghazi Time) Disposition Final User 11/01/2021 1:08:52 PM Send to Urgent Christean Leaf, Arta Bruce 11/01/2021 1:18:06 PM SEE PCP WITHIN 3 DAYS Yes [place], RN, Amy Caller Disagree/Comply Comply Caller Understands Yes PreDisposition InappropriateToAsk Care Advice Given Per Guideline SEE PCP WITHIN 3 DAYS: * You need to be seen within 2 or 3 days. * PCP VISIT: Call your doctor (or NP/PA) during regular office hours and make an appointment. A clinic or urgent care center are good places to go for care if your doctor's office is closed or you can't get an appointment. NOTE: If office Joyce be open tomorrow, tell caller to call then, not in 3 days. ANXIETY - HEALTHY LIFESTYLE TIPS: * There are things you can do to feel better. * Drink adequate fluids: Drink 6 to 8 cups (1,500 to 2,000 ml) of water each day. * Eat healthy: Eat a well-balanced diet. * Exercise regularly: Take a daily walk. Regular exercise Joyce improve your overall health, improve your mood, and is a simple way to reduce stress. * Get more sleep: Most people need 7 to 8 hours of sleep each night. Being well-rested improves your mood and your sense of well-being. * Limit your alcohol intake: Too much alcohol can make anxiety worse. * Talk with friends and family: Share how you are feeling with someone. Make sure that your spouse, family, or friends know how you are feeling. AVOID CAFFEINE: * Avoid caffeine-containing beverages. Reason: It is a stimulant and can aggravate anxiety. AVOID TRIGGERS OF ANXIETY: * Limit your alcohol to no more than 2 drinks a day. After dinner drinking causes  insomnia 3 to 4 hours after falling asleep. STRESS REDUCTION: * Try a calming activity. Here are some examples: go for a daily walk, spend time with friends or family, read a book. *  Learn relaxation technique such as meditation or yoga. CALL BACK IF: * You become worse CARE ADVICE given per Anxiety and Panic Attack (Adult) guideline.

## 2021-11-01 NOTE — Telephone Encounter (Signed)
Patient has OV on 11/02/2021

## 2021-11-01 NOTE — Telephone Encounter (Signed)
Patient has called in requesting appointment for anemia.  When asked what symptoms she has, patient stated she was having difficulty breathing.    I have sent patient to team health for triage.

## 2021-11-01 NOTE — Progress Notes (Signed)
Joyce Gray is a 46 y.o. female here for a new problem of SOB.  History of Present Illness:   Chief Complaint  Patient presents with   Shortness of Breath    Pt c/o SOB x 4 days, slight nasal congestion.   Anxiety    Pt has a test coming up that she is anxious about not sure if SOB is from that.    HPI  Shortness of breath; Heavy periods Patient complain of difficulty breathing that has been onset for the past 4 days. Symptoms comes and goes. Per pt, she feel shortness of breath when going to bed at night and is winding down for the day. States she had similar symptoms yesterday when she was in her car. She was unable to take deep breaths. She has been experiencing heavy periods and thinks she might be anemic. She is going to fly to [facility] soon and is worried about getting on a plane if she is really anemic. States her periods are usually 5-6 days long and the first 3 days are heavier. Wears maxi/super pads and changes this 3 times a day.  She does have a hx of slight asthma when she was a kid. States she was given inhaler at that time.  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 Does feel lightheadedness sometimes. No chest pain or leg swelling/pain. No fever or chills. No hx of blood clot, recent immobilization, cancer. No current estrogen use. Does have hx of fibroids.   Anxiety  Patient is currently taking Lexapro 5 mg daily with no complications. She does admit she is anxious about a test which is coming up in few weeks. 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 States she has been taking Lexapro 5 mg for the past 10-11 years. She has been seeing counseling every two weeks. She has been struggling to sleep at night. Takes Trazodone 50 mg as needed since this gave her headaches and she feel "groggy". States she took Trazodone few times last week. Denies any other worsening symptoms. Denies any other concerning sx. She is tearful  today.  Weight gain She has been experiencing weight gain x 1 year. She is worried about developing diabetes. Has had weight gain while getting out of recent relatinship that did not end well and also while preparing for her upcoming exam.  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     Social History   Tobacco Use   Smoking status: Never   Smokeless tobacco: Never  Vaping Use   Vaping Use: Never used  Substance Use Topics   Alcohol use: Not Currently    Comment: Rare   Drug use: No    Past Surgical History:  Procedure Laterality Date   CRYOTHERAPY  1999   for abnormal pap smear   TYMPANOSTOMY TUBE PLACEMENT      Family History  Problem Relation Age of Onset   Hyperlipidemia Mother    Alcohol abuse Mother    Melanoma Father    Melanoma Paternal Aunt 27   Heart disease Maternal Grandfather    Breast cancer Maternal Aunt 63   Bipolar disorder Brother     No Known Allergies  Current Medications:   Current Outpatient Medications:  escitalopram (LEXAPRO) 10 MG tablet, , Disp: , Rfl:    GLUCOSAMINE-CHONDROITIN PO, Take by mouth. Glucosamine 1500 mg & Chondroitin 120 mg, Disp: , Rfl:    Multiple Vitamin (MULTI-VITAMIN DAILY PO), SMARTSIG:1 By Mouth, Disp: , Rfl:    traZODone (DESYREL) 50 MG tablet, Take 0.5-1 tablets (25-50 mg total) by mouth at bedtime as needed for sleep., Disp: 30 tablet, Rfl: 3   Review of Systems:   ROS Negative unless otherwise specified per HPI.   Vitals:   Vitals:   11/02/21 0823  BP: 130/76  Pulse: 71  Temp: (!) 97.4 F (36.3 C)  TempSrc: Temporal  SpO2: 97%  Weight: 233 lb 4 oz (105.8 kg)  Height: 5' 8.5" (1.74 m)     Body mass index is 34.95 kg/m.  Physical Exam:   Physical Exam Vitals and nursing note reviewed.  Constitutional:      General: She is not in  acute distress.    Appearance: She is well-developed. She is not ill-appearing or toxic-appearing.  Cardiovascular:     Rate and Rhythm: Normal rate and regular rhythm.     Pulses: Normal pulses.     Heart sounds: Normal heart sounds, S1 normal and S2 normal.  Pulmonary:     Effort: Pulmonary effort is normal.     Breath sounds: Normal breath sounds.  Musculoskeletal:     Right lower leg: No edema.     Left lower leg: No edema.     Comments: No visible calf swelling/tenderness  Skin:    General: Skin is warm and dry.  Neurological:     Mental Status: She is alert.     GCS: GCS eye subscore is 4. GCS verbal subscore is 5. GCS motor subscore is 6.  Psychiatric:        Speech: Speech normal.        Behavior: Behavior normal. Behavior is cooperative.     Assessment and Plan:   SOB (shortness of breath) No red flags Vitals are stable, lung exam clear, no obvious work of breathing or wheezing Recommend working on relaxation and deep breathing techniques Suspect due to anxiety and upcoming test I have asked her to send me a message after she completes her exam - if persists, we will consider getting imaging Worsening precautions advised Wells Score is 0  Anxiety Uncontrolled Increased due to situational issue with her upcoming exam Recommend trialing 1/2 tablet of trazodone at night instead of full tablet to see if it can help her get better sleep and not cause as much grogginess/HA in AM Continue talk therapy Consider increase of lexapro if anxiety persists after exam Has upcoming CPE with PCP and can revisit this then if needed I discussed with patient that if they develop any SI, to tell someone immediately and seek medical attention.  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   I, [name], [place], have reviewed all documentation  for this visit. The documentation on 11/02/21 for the exam, diagnosis, procedures, and orders are all accurate and complete.  [name], PA-C

## 2021-11-02 ENCOUNTER — Encounter: Payer: Self-pay | Admitting: Physician Assistant

## 2021-11-02 ENCOUNTER — Ambulatory Visit: Payer: BC Managed Care – PPO | Admitting: Physician Assistant

## 2021-11-02 VITALS — BP 130/76 | HR 71 | Temp 97.4°F | Ht 68.5 in | Wt 233.2 lb

## 2021-11-02 DIAGNOSIS — N920 Excessive and frequent menstruation with regular cycle: Secondary | ICD-10-CM

## 2021-11-02 DIAGNOSIS — F419 Anxiety disorder, unspecified: Secondary | ICD-10-CM | POA: Diagnosis not present

## 2021-11-02 DIAGNOSIS — R0602 Shortness of breath: Secondary | ICD-10-CM

## 2021-11-02 DIAGNOSIS — R635 Abnormal weight gain: Secondary | ICD-10-CM | POA: Diagnosis not present

## 2021-11-02 LAB — TSH: TSH: 2.71 u[IU]/mL (ref 0.35–5.50)

## 2021-11-02 LAB — CBC WITH DIFFERENTIAL/PLATELET
Basophils Absolute: 0 10*3/uL (ref 0.0–0.1)
Basophils Relative: 0.2 % (ref 0.0–3.0)
Eosinophils Absolute: 0.4 10*3/uL (ref 0.0–0.7)
Eosinophils Relative: 3.8 % (ref 0.0–5.0)
HCT: 38.8 % (ref 36.0–46.0)
Hemoglobin: 13 g/dL (ref 12.0–15.0)
Lymphocytes Relative: 28.1 % (ref 12.0–46.0)
Lymphs Abs: 2.8 10*3/uL (ref 0.7–4.0)
MCHC: 33.6 g/dL (ref 30.0–36.0)
MCV: 90 fl (ref 78.0–100.0)
Monocytes Absolute: 0.7 10*3/uL (ref 0.1–1.0)
Monocytes Relative: 6.9 % (ref 3.0–12.0)
Neutro Abs: 6 10*3/uL (ref 1.4–7.7)
Neutrophils Relative %: 61 % (ref 43.0–77.0)
Platelets: 317 10*3/uL (ref 150.0–400.0)
RBC: 4.3 Mil/uL (ref 3.87–5.11)
RDW: 13.6 % (ref 11.5–15.5)
WBC: 9.9 10*3/uL (ref 4.0–10.5)

## 2021-11-02 LAB — LIPID PANEL
Cholesterol: 212 mg/dL — ABNORMAL HIGH (ref 0–200)
HDL: 46.7 mg/dL (ref >39.00)
NonHDL: 165.42
Total CHOL/HDL Ratio: 5
Triglycerides: 228 mg/dL — ABNORMAL HIGH (ref 0.0–149.0)
VLDL: 45.6 mg/dL — ABNORMAL HIGH (ref 0.0–40.0)

## 2021-11-02 LAB — COMPREHENSIVE METABOLIC PANEL
ALT: 15 U/L (ref 0–35)
AST: 21 U/L (ref 0–37)
Albumin: 4 g/dL (ref 3.5–5.2)
Alkaline Phosphatase: 73 U/L (ref 39–117)
BUN: 12 mg/dL (ref 6–23)
CO2: 27 mEq/L (ref 19–32)
Calcium: 9.2 mg/dL (ref 8.4–10.5)
Chloride: 102 mEq/L (ref 96–112)
Creatinine, Ser: 0.72 mg/dL (ref 0.40–1.20)
GFR: 101.19 mL/min (ref >60.00)
Glucose, Bld: 82 mg/dL (ref 70–99)
Potassium: 4.1 mEq/L (ref 3.5–5.1)
Sodium: 136 mEq/L (ref 135–145)
Total Bilirubin: 0.4 mg/dL (ref 0.2–1.2)
Total Protein: 7.2 g/dL (ref 6.0–8.3)

## 2021-11-02 LAB — HEMOGLOBIN A1C: Hgb A1c MFr Bld: 5.3 % (ref 4.6–6.5)

## 2021-11-02 LAB — IBC + FERRITIN
Ferritin: 17.1 ng/mL (ref 10.0–291.0)
Iron: 98 ug/dL (ref 42–145)
Saturation Ratios: 23.9 % (ref 20.0–50.0)
TIBC: 410.2 ug/dL (ref 250.0–450.0)
Transferrin: 293 mg/dL (ref 212.0–360.0)

## 2021-11-02 LAB — LDL CHOLESTEROL, DIRECT: Direct LDL: 144 mg/dL

## 2021-11-02 NOTE — Patient Instructions (Signed)
It was great to see you!  We will update your blood work today.  Message me after your exam and let me know if your symptoms improve or do not improve. I'm happy to help out with your care while Dr. [name] is away.  I will be in touch with your blood work results.  Take care,  [name] PA-C

## 2021-11-08 ENCOUNTER — Encounter: Payer: Self-pay | Admitting: Physician Assistant

## 2021-11-11 ENCOUNTER — Other Ambulatory Visit: Payer: Self-pay | Admitting: *Deleted

## 2021-11-11 DIAGNOSIS — N920 Excessive and frequent menstruation with regular cycle: Secondary | ICD-10-CM

## 2021-11-22 ENCOUNTER — Ambulatory Visit (INDEPENDENT_AMBULATORY_CARE_PROVIDER_SITE_OTHER): Payer: BC Managed Care – PPO | Admitting: Family Medicine

## 2021-11-22 ENCOUNTER — Encounter: Payer: Self-pay | Admitting: Family Medicine

## 2021-11-22 VITALS — BP 102/62 | HR 72 | Temp 97.4°F | Ht 68.5 in | Wt 230.6 lb

## 2021-11-22 DIAGNOSIS — Z1211 Encounter for screening for malignant neoplasm of colon: Secondary | ICD-10-CM

## 2021-11-22 DIAGNOSIS — F419 Anxiety disorder, unspecified: Secondary | ICD-10-CM

## 2021-11-22 DIAGNOSIS — Z0001 Encounter for general adult medical examination with abnormal findings: Secondary | ICD-10-CM

## 2021-11-22 DIAGNOSIS — F329 Major depressive disorder, single episode, unspecified: Secondary | ICD-10-CM

## 2021-11-22 DIAGNOSIS — E785 Hyperlipidemia, unspecified: Secondary | ICD-10-CM

## 2021-11-22 DIAGNOSIS — J309 Allergic rhinitis, unspecified: Secondary | ICD-10-CM

## 2021-11-22 MED ORDER — FLUTICASONE PROPIONATE 50 MCG/ACT NA SUSP: 2.0000 | Freq: Every day | NASAL | 6 refills | Status: DC

## 2021-11-22 NOTE — Assessment & Plan Note (Signed)
Follows with psychiatry.  She has been under increased stress recently due to ongoing [employer] certification process with work.  This could be contributing some to her intermittent issues with shortness of breath.  Recommend she discuss with psychiatrist about increasing her dose of Lexapro from 5 to 10 mg daily.

## 2021-11-22 NOTE — Assessment & Plan Note (Signed)
Potentially contributing to her issues with intermittent shortness of breath.  She does have some areas of irritation in her nasal canal today.  We will restart Flonase.  She will let me know if not improving.

## 2021-11-22 NOTE — Progress Notes (Signed)
Chief Complaint:  Joyce Gray is a 46 y.o. female who presents today for her annual comprehensive physical exam.    Assessment/Plan:  New/Acute Problems: Shortness of Breath No red flags.  Reassuring cardiopulmonary exam today.  He has 2 atypical for cardiac or pulmonary etiology.  Likely multifactorial in setting of anxiety and recent poor air quality.  She has had improvement since starting iron supplementation.  She had labs done a couple weeks ago.  Do not need to repeat any labs today.  She will talk with her psychiatrist about increasing her dose of Lexapro as below.  We discussed reasons to return to care.  She will let me know if not improving.  Chronic Problems Addressed Today: Anxiety Follows with psychiatry.  She has been under increased stress recently due to ongoing [employer] certification process with work.  This could be contributing some to her intermittent issues with shortness of breath.  Recommend she discuss with psychiatrist about increasing her dose of Lexapro from 5 to 10 mg daily.  Depression Overall stable though she will discuss with psychiatry about increasing dose of Lexapro.  Allergic rhinitis Potentially contributing to her issues with intermittent shortness of breath.  She does have some areas of irritation in her nasal canal today.  We will restart Flonase.  She will let me know if not improving.  Dyslipidemia Discussed recent labs with patient.  Cholesterol still borderline elevated.  We discussed lifestyle modifications.  Does not need a statin at this point.  We can recheck in year.  Preventative Healthcare: We will place referral for colonoscopy.  Patient Counseling(The following topics were reviewed and/or handout was given):  -Nutrition: Stressed importance of moderation in sodium/caffeine intake, saturated fat and cholesterol, caloric balance, sufficient intake of fresh fruits, vegetables, and fiber.  -Stressed the importance of regular  exercise.   -Substance Abuse: Discussed cessation/primary prevention of tobacco, alcohol, or other drug use; driving or other dangerous activities under the influence; availability of treatment for abuse.   -Injury prevention: Discussed safety belts, safety helmets, smoke detector, smoking near bedding or upholstery.   -Sexuality: Discussed sexually transmitted diseases, partner selection, use of condoms, avoidance of unintended pregnancy and contraceptive alternatives.   -Dental health: Discussed importance of regular tooth brushing, flossing, and dental visits.  -Health maintenance and immunizations reviewed. Please refer to Health maintenance section.  Return to care in 1 year for next preventative visit.     Subjective:  HPI:  See A/P for status of chronic conditions.  She is having intermittent issues with shortness of breath over the last several weeks.  She was seen by the provider here a few weeks ago.  Thought it was multifactorial in setting of heavy periods with iron deficiency and increased anxiety.  Symptoms have been stable over the last couple of weeks.  She has started iron supplementation has noticed this is helped with her intermittent shortness of breath.  Her episodes can occur randomly.  Usually when laying down or sitting still.  No exertional symptoms.  She has also noticed more nasal irritation that she thinks could be due to the decreased air quality recently.  She has tried using Flonase with modest improvement.  Lifestyle Diet: Trying to cut down on snacks.      11/22/2021    8:06 AM  Depression screen PHQ 2/9  Decreased Interest 0  Down, Depressed, Hopeless 0  PHQ - 2 Score 0    Health Maintenance Due  Topic Date Due   COLONOSCOPY (  Pts 45-46yr Insurance coverage will need to be confirmed)  Never done     ROS: Per HPI, otherwise a complete review of systems was negative.   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     fluticasone (FLONASE) 50 MCG/ACT nasal spray Place 2 sprays into both nostrils daily. 16 g 6   GLUCOSAMINE-CHONDROITIN PO Take by mouth. Glucosamine 1500 mg & Chondroitin 120 mg     Multiple Vitamin (MULTI-VITAMIN DAILY PO) SMARTSIG:1 By Mouth     traZODone (DESYREL) 50 MG tablet Take 0.5-1 tablets (25-50 mg total) by mouth at bedtime as needed for sleep. 30 tablet 3   No current facility-administered medications for this visit.    Allergies-reviewed and updated No Known Allergies  Social History   Socioeconomic History   Marital status: Single    Spouse name: Not on file   Number of children: 0   Years of education: 16   Highest education level: Not on file  Occupational History   Occupation: TProduct manager [place]  Tobacco Use   Smoking status: Never   Smokeless tobacco: Never   Vaping Use   Vaping Use: Never used  Substance and Sexual Activity   Alcohol use: Not Currently    Comment: Rare   Drug use: No   Sexual activity: Not Currently    Partners: Male  Other Topics Concern   Not on file  Social History Narrative   Single. SMerchant navy officer Fun/Hobby: Hiking, reading, coffee with friends, dancing.    Social Determinants of Health   Financial Resource Strain: Not on file  Food Insecurity: Not on file  Transportation Needs: Not on file  Physical Activity: Not on file  Stress: Not on file  Social Connections: Not on file        Objective:  Physical Exam: BP 102/62   Pulse 72   Temp (!) 97.4 F (36.3 C) (Temporal)   Ht 5' 8.5" (1.74 m)   Wt 230 lb 9.6 oz (104.6 kg)   LMP 10/21/2021 (Approximate)   SpO2 98%   BMI 34.55 kg/m   Body mass index is 34.55 kg/m. Wt Readings from Last 3 Encounters:  11/22/21 230 lb 9.6 oz (104.6 kg)  11/02/21 233 lb 4 oz (105.8 kg)  04/06/21 227 lb (103 kg)   Gen: NAD, resting comfortably HEENT: TMs normal bilaterally. OP clear. No thyromegaly noted.  Bilateral nasal mucosa with slight edema and erythema. CV: RRR with no murmurs appreciated Pulm: NWOB, CTAB with no crackles, wheezes, or rhonchi GI: Normal bowel sounds present. Soft, Nontender, Nondistended. MSK: no edema, cyanosis, or clubbing noted Skin: warm, dry Neuro: CN2-12 grossly intact. Strength 5/5 in upper and lower extremities. Reflexes symmetric and intact bilaterally.  Psych: Normal affect and thought content     [place] M. [name], MD 11/22/2021 8:35 AM

## 2021-11-22 NOTE — Assessment & Plan Note (Signed)
Overall stable though she will discuss with psychiatry about increasing dose of Lexapro.

## 2021-11-22 NOTE — Patient Instructions (Signed)
It was very nice to see you today!  Your heart and lungs are normal today.  Please talk with your psychiatrist about increasing her dose of Lexapro.  Please continue your iron supplementation.  I will refill your Flonase and place referral for you to get a colonoscopy.  Please continue work on diet and exercise.  I will see back in year for your next physical.  Come back sooner if needed.  Take care, Dr [name]  PLEASE NOTE:  If you had any lab tests please let us know if you have not heard back within a few days. You may see your results on mychart before we have a chance to review them but we will give you a call once they are reviewed by [name]. If we ordered any referrals today, please let us know if you have not heard from their office within the next week.   Please try these tips to maintain a healthy lifestyle:  Eat at least 3 REAL meals and 1-2 snacks per day.  Aim for no more than 5 hours between eating.  If you eat breakfast, please do so within one hour of getting up.   Each meal should contain half fruits/vegetables, one quarter protein, and one quarter carbs (no bigger than a computer mouse)  Cut down on sweet beverages. This includes juice, soda, and sweet tea.   Drink at least 1 glass of water with each meal and aim for at least 8 glasses per day  Exercise at least 150 minutes every week.    Preventive Care 26-87 Years Old, Female Preventive care refers to lifestyle choices and visits with your health care provider that can promote health and wellness. Preventive care visits are also called wellness exams. What can I expect for my preventive care visit? Counseling Your health care provider may ask you questions about your: Medical history, including: Past medical problems. Family medical history. Pregnancy history. Current health, including: Menstrual cycle. Method of birth control. Emotional well-being. Home life and relationship well-being. Sexual activity and  sexual health. Lifestyle, including: Alcohol, nicotine or tobacco, and drug use. Access to firearms. Diet, exercise, and sleep habits. Work and work Statistician. Sunscreen use. Safety issues such as seatbelt and bike helmet use. Physical exam Your health care provider will check your: Height and weight. These may be used to calculate your BMI (body mass index). BMI is a measurement that tells if you are at a healthy weight. Waist circumference. This measures the distance around your waistline. This measurement also tells if you are at a healthy weight and may help predict your risk of certain diseases, such as type 2 diabetes and high blood pressure. Heart rate and blood pressure. Body temperature. Skin for abnormal spots. What immunizations do I need?  Vaccines are usually given at various ages, according to a schedule. Your health care provider will recommend vaccines for you based on your age, medical history, and lifestyle or other factors, such as travel or where you work. What tests do I need? Screening Your health care provider may recommend screening tests for certain conditions. This may include: Lipid and cholesterol levels. Diabetes screening. This is done by checking your blood sugar (glucose) after you have not eaten for a while (fasting). Pelvic exam and Pap test. Hepatitis B test. Hepatitis C test. HIV (human immunodeficiency virus) test. STI (sexually transmitted infection) testing, if you are at risk. Lung cancer screening. Colorectal cancer screening. Mammogram. Talk with your health care provider about when you should  start having regular mammograms. This may depend on whether you have a family history of breast cancer. BRCA-related cancer screening. This may be done if you have a family history of breast, ovarian, tubal, or peritoneal cancers. Bone density scan. This is done to screen for osteoporosis. Talk with your health care provider about your test results,  treatment options, and if necessary, the need for more tests. Follow these instructions at home: Eating and drinking  Eat a diet that includes fresh fruits and vegetables, whole grains, lean protein, and low-fat dairy products. Take vitamin and mineral supplements as recommended by your health care provider. Do not drink alcohol if: Your health care provider tells you not to drink. You are pregnant, may be pregnant, or are planning to become pregnant. If you drink alcohol: Limit how much you have to 0-1 drink a day. Know how much alcohol is in your drink. In the [place] one drink equals one 12 oz bottle of beer (355 mL), one 5 oz glass of wine (148 mL), or one 1 oz glass of hard liquor (44 mL). Lifestyle Brush your teeth every morning and night with fluoride toothpaste. Floss one time each day. Exercise for at least 30 minutes 5 or more days each week. Do not use any products that contain nicotine or tobacco. These products include cigarettes, chewing tobacco, and vaping devices, such as e-cigarettes. If you need help quitting, ask your health care provider. Do not use drugs. If you are sexually active, practice safe sex. Use a condom or other form of protection to prevent STIs. If you do not wish to become pregnant, use a form of birth control. If you plan to become pregnant, see your health care provider for a prepregnancy visit. Take aspirin only as told by your health care provider. Make sure that you understand how much to take and what form to take. Work with your health care provider to find out whether it is safe and beneficial for you to take aspirin daily. Find healthy ways to manage stress, such as: Meditation, yoga, or listening to music. Journaling. Talking to a trusted person. Spending time with friends and family. Minimize exposure to UV radiation to reduce your risk of skin cancer. Safety Always wear your seat belt while driving or riding in a vehicle. Do not drive: If you  have been drinking alcohol. Do not ride with someone who has been drinking. When you are tired or distracted. While texting. If you have been using any mind-altering substances or drugs. Wear a helmet and other protective equipment during sports activities. If you have firearms in your house, make sure you follow all gun safety procedures. Seek help if you have been physically or sexually abused. What's next? Visit your health care provider once a year for an annual wellness visit. Ask your health care provider how often you should have your eyes and teeth checked. Stay up to date on all vaccines. This information is not intended to replace advice given to you by your health care provider. Make sure you discuss any questions you have with your health care provider. Document Revised: 11/04/2020 Document Reviewed: 11/04/2020 Elsevier Patient Education  [place].

## 2021-11-22 NOTE — Assessment & Plan Note (Signed)
Discussed recent labs with patient.  Cholesterol still borderline elevated.  We discussed lifestyle modifications.  Does not need a statin at this point.  We can recheck in year.

## 2021-11-24 ENCOUNTER — Ambulatory Visit (INDEPENDENT_AMBULATORY_CARE_PROVIDER_SITE_OTHER): Payer: BC Managed Care – PPO | Admitting: Psychiatry

## 2021-11-24 DIAGNOSIS — D500 Iron deficiency anemia secondary to blood loss (chronic): Secondary | ICD-10-CM

## 2021-11-24 DIAGNOSIS — F4010 Social phobia, unspecified: Secondary | ICD-10-CM

## 2021-11-24 DIAGNOSIS — F4323 Adjustment disorder with mixed anxiety and depressed mood: Secondary | ICD-10-CM

## 2021-11-24 DIAGNOSIS — R69 Illness, unspecified: Secondary | ICD-10-CM | POA: Diagnosis not present
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
## 2021-12-10 ENCOUNTER — Ambulatory Visit: Payer: BC Managed Care – PPO | Admitting: Psychiatry

## 2021-12-14 ENCOUNTER — Encounter: Payer: Self-pay | Admitting: Family Medicine

## 2021-12-15 NOTE — Telephone Encounter (Signed)
See note

## 2021-12-16 NOTE — Telephone Encounter (Signed)
I appreciate the update. We can refer fer to a cardiologist if she is agreeable.   Also a pulmonologist for lung function testing would be reasonable if she is agreeable.

## 2021-12-20 ENCOUNTER — Ambulatory Visit (INDEPENDENT_AMBULATORY_CARE_PROVIDER_SITE_OTHER): Payer: BC Managed Care – PPO | Admitting: Psychiatry

## 2021-12-20 DIAGNOSIS — R69 Illness, unspecified: Secondary | ICD-10-CM

## 2021-12-20 DIAGNOSIS — F4010 Social phobia, unspecified: Secondary | ICD-10-CM | POA: Diagnosis not present

## 2021-12-20 DIAGNOSIS — F4323 Adjustment disorder with mixed anxiety and depressed mood: Secondary | ICD-10-CM | POA: Diagnosis not present

## 2021-12-20 NOTE — Progress Notes (Signed)
Psychotherapy Progress Note [facility] [name], PhD LP  Patient ID: Joyce Gray [facility])    MRN: [id number] Therapy format: Individual psychotherapy Date: 12/20/2021      Start: 10:14a     Stop: 11:01a     Time Spent: 47 min Location: In-person   Session narrative (presenting needs, interim history, self-report of stressors and symptoms, applications of prior therapy, status changes, and interventions made in session) Main concern again with Preston Fleeting.  Inconsistent signals from him, still back in touch but vacant, absent attention, nonreacting to opportunities to see her.  Decided to break up again.  Recounted recent communications, evaluated her responses.  Without invalidating current decision, discussed her tendency over time to demonize the man in her life, and regret over turning down one she remembers being very good and suitable.  Validated motivations in tension re. not demonizing to cope with relationship anxiety and uncertainty and not having to sell out to be loved, either.  Also validated unwanted situation being unattached at this stage.  Trip to see Jeannetta Nap involved some times of feeling unable to breathe due to anxious-making conditions.  Does notice breathing better when she asserted her need to stop some emotional commentary Therapist, art) and when she came through the confinement of the plane.  Affirmed and encouraged to use as self-prompts to self-remind she is durable and to grant permission to assert her wishes with others.  Therapeutic modalities: Cognitive Behavioral Therapy, Solution-Oriented/Positive Psychology, and Ego-Supportive  Mental Status/Observations:  Appearance:   Casual     Behavior:  Appropriate  Motor:  Normal  Speech/Language:   Clear and Coherent  Affect:  Appropriate  Mood:  sad  Thought process:  normal  Thought content:    WNL  Sensory/Perceptual disturbances:    WNL  Orientation:  Fully oriented  Attention:   Good    Concentration:  Good  Memory:  WNL  Insight:    Good  Judgment:   Good  Impulse Control:  Good   Risk Assessment: Danger to Self: No Self-injurious Behavior: No Danger to Others: No Physical Aggression / Violence: No Duty to Warn: No Access to Firearms a concern: No  Assessment of progress:  progressing  Diagnosis:   ICD-10-CM   1. Social anxiety disorder  F40.10     2. Adjustment disorder with mixed anxiety and depressed mood  F43.23     3. r/o early onset dysthymia  R69      Plan:  Continue use of breathing techniques for self-soothing and panic control Apply insight that breathing/panic sxs relieve when she grants herself permission to acknowledge feelings and wishes Self-affirm sexual discretion, endorse tabling the relationship with Preston Fleeting Self-affirm it's OK to want a relationship, prevent selling out, and need to proves she won't demonize all at the same time Self-affirm OK to increase dose of Lexapro, reframe as "rightsizing" medication help, not "failing" in coping Self-affirm boundary setting with mother, encourage more clarity about how/when to take up requests and about unwillingness to sign onto prefab and overly responsible plans Come back to issue with [name] and calling for father's standards about inviting/allowing him back in Come back to apology to relatives for dumping about mother Other recommendations/advice as may be noted above Continue to utilize previously learned skills ad lib Maintain medication as prescribed and work faithfully with relevant prescriber(s) if any changes are desired or seem indicated Call the clinic on-call service, 988/hotline, 911, or present to [facility] or ER if any life-threatening psychiatric crisis  Return for session(s) already scheduled. Already scheduled visit in this office 01/03/2022.  [name], PhD [name], PhD LP Clinical Psychologist, [facility] [address], [place], [place] [address] [phone]

## 2022-01-03 ENCOUNTER — Ambulatory Visit: Payer: BC Managed Care – PPO | Admitting: Psychiatry

## 2022-01-03 DIAGNOSIS — Z62820 Parent-biological child conflict: Secondary | ICD-10-CM

## 2022-01-03 DIAGNOSIS — F4010 Social phobia, unspecified: Secondary | ICD-10-CM

## 2022-01-03 DIAGNOSIS — R69 Illness, unspecified: Secondary | ICD-10-CM | POA: Diagnosis not present

## 2022-01-03 DIAGNOSIS — F4323 Adjustment disorder with mixed anxiety and depressed mood: Secondary | ICD-10-CM | POA: Diagnosis not present
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
## 2022-01-19 ENCOUNTER — Ambulatory Visit (INDEPENDENT_AMBULATORY_CARE_PROVIDER_SITE_OTHER): Payer: BC Managed Care – PPO | Admitting: Psychiatry

## 2022-01-19 DIAGNOSIS — F4010 Social phobia, unspecified: Secondary | ICD-10-CM | POA: Diagnosis not present

## 2022-01-19 DIAGNOSIS — R69 Illness, unspecified: Secondary | ICD-10-CM | POA: Diagnosis not present

## 2022-01-19 DIAGNOSIS — Z5689 Other problems related to employment: Secondary | ICD-10-CM

## 2022-01-19 DIAGNOSIS — F4323 Adjustment disorder with mixed anxiety and depressed mood: Secondary | ICD-10-CM | POA: Diagnosis not present

## 2022-01-19 NOTE — Progress Notes (Signed)
Psychotherapy Progress Note [facility] 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  Undecided, and would most likely see through the school year.  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  Knows she also likes to write, e.g., news and features, research papers.  Journalism could conceivably be an option, though unsure how to break in.  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  Also in the midst of a 75-day workout challenge, 45 min/day, with a low fat, low sugar diet approach.  Seeing early results, feels good.  Affirmed and encouraged.  No notable new challenges right now with family boundary pressures.  Therapeutic modalities: Cognitive Behavioral Therapy, Solution-Oriented/Positive Psychology, and Ego-Supportive  Mental Status/Observations:  Appearance:   Casual     Behavior:  Appropriate  Motor:  Normal  Speech/Language:   Clear and Coherent  Affect:  Appropriate  Mood:   Brightening, though wearied re job  Thought process:  normal  Thought content:    WNL  Sensory/Perceptual disturbances:    WNL  Orientation:  Fully oriented  Attention:  Good    Concentration:  Good  Memory:  WNL  Insight:    Good  Judgment:   Good  Impulse Control:  Good   Risk Assessment: Danger to Self: No Self-injurious Behavior: No Danger to Others: No Physical Aggression / Violence: No Duty to Warn: No Access to Firearms a concern: No  Assessment of progress:  progressing  Diagnosis:   ICD-10-CM   1. Social anxiety disorder  F40.10     2. Adjustment disorder with mixed anxiety and depressed mood  F43.23     3. r/o early onset dysthymia  R69     4. Career choice problem  Z56.89      Plan:  Physical/mental health -- Continue with diet/exercise program, notice if carb control helps headache and possible reactive hypoglycemia, possibly better anxiety management Anxiety coping -- Continue use of breathing techniques, practice acknowledgment of feelings and wishes Relationship issues -- Self-affirm sexual discretion, endorse tabling relationship with Preston Fleeting, self-affirm it's OK to want a relationship, prevent selling out, and need to proves she won't demonize all at the same time Medication -- Self-affirm OK to increase dose of Lexapro, reframe as "rightsizing" medication help, not "failing" in coping Family boundaries -- Self-affirm boundary setting with mother, encourage more clarity about how/when to take up requests and about unwillingness to sign onto prefab and overly responsible plans, come back to issue with [name] and father's standards about inviting/allowing him back in, come back  to apology to relatives for dumping about mother Other recommendations/advice as may be noted above Continue to utilize previously learned skills ad lib Maintain medication as prescribed and work faithfully with relevant prescriber(s) if any changes are desired or seem indicated Call the  clinic on-call service, 988/hotline, 911, or present to [facility] or ER if any life-threatening psychiatric crisis Return for time as available. Already scheduled visit in this office 02/23/2022.  [name], PhD [name], PhD LP Clinical Psychologist, [facility] [address] [place], [place] [address] [phone]

## 2022-01-26 NOTE — Telephone Encounter (Signed)
Patient states she would like to be referred to a Pulmonologist.

## 2022-01-27 ENCOUNTER — Other Ambulatory Visit: Payer: Self-pay | Admitting: *Deleted

## 2022-01-27 DIAGNOSIS — J309 Allergic rhinitis, unspecified: Secondary | ICD-10-CM

## 2022-01-27 NOTE — Telephone Encounter (Signed)
Referral placed.

## 2022-02-10 ENCOUNTER — Other Ambulatory Visit (INDEPENDENT_AMBULATORY_CARE_PROVIDER_SITE_OTHER): Payer: BC Managed Care – PPO

## 2022-02-10 ENCOUNTER — Ambulatory Visit (INDEPENDENT_AMBULATORY_CARE_PROVIDER_SITE_OTHER): Payer: BC Managed Care – PPO

## 2022-02-10 DIAGNOSIS — Z23 Encounter for immunization: Secondary | ICD-10-CM | POA: Diagnosis not present

## 2022-02-10 DIAGNOSIS — N920 Excessive and frequent menstruation with regular cycle: Secondary | ICD-10-CM | POA: Diagnosis not present

## 2022-02-10 LAB — CBC WITH DIFFERENTIAL/PLATELET
Basophils Absolute: 0 10*3/uL (ref 0.0–0.1)
Basophils Relative: 0.4 % (ref 0.0–3.0)
Eosinophils Absolute: 0.3 10*3/uL (ref 0.0–0.7)
Eosinophils Relative: 3.5 % (ref 0.0–5.0)
HCT: 39.1 % (ref 36.0–46.0)
Hemoglobin: 12.8 g/dL (ref 12.0–15.0)
Lymphocytes Relative: 32.7 % (ref 12.0–46.0)
Lymphs Abs: 3 10*3/uL (ref 0.7–4.0)
MCHC: 32.8 g/dL (ref 30.0–36.0)
MCV: 91.4 fl (ref 78.0–100.0)
Monocytes Absolute: 0.6 10*3/uL (ref 0.1–1.0)
Monocytes Relative: 6.3 % (ref 3.0–12.0)
Neutro Abs: 5.2 10*3/uL (ref 1.4–7.7)
Neutrophils Relative %: 57.1 % (ref 43.0–77.0)
Platelets: 328 10*3/uL (ref 150.0–400.0)
RBC: 4.28 Mil/uL (ref 3.87–5.11)
RDW: 13.6 % (ref 11.5–15.5)
WBC: 9.2 10*3/uL (ref 4.0–10.5)

## 2022-02-11 LAB — IBC + FERRITIN
Ferritin: 24.4 ng/mL (ref 10.0–291.0)
Iron: 67 ug/dL (ref 42–145)
Saturation Ratios: 20 % (ref 20.0–50.0)
TIBC: 334.6 ug/dL (ref 250.0–450.0)
Transferrin: 239 mg/dL (ref 212.0–360.0)

## 2022-02-15 NOTE — Progress Notes (Signed)
Please inform patient of the following:  Labs are all normal.  Inmer Nix M. [name], MD 02/15/2022 7:45 AM

## 2022-02-23 ENCOUNTER — Ambulatory Visit (INDEPENDENT_AMBULATORY_CARE_PROVIDER_SITE_OTHER): Payer: BC Managed Care – PPO | Admitting: Psychiatry

## 2022-02-23 DIAGNOSIS — Z62820 Parent-biological child conflict: Secondary | ICD-10-CM

## 2022-02-23 DIAGNOSIS — Z630 Problems in relationship with spouse or partner: Secondary | ICD-10-CM | POA: Diagnosis not present

## 2022-02-23 DIAGNOSIS — F4010 Social phobia, unspecified: Secondary | ICD-10-CM | POA: Diagnosis not present

## 2022-02-23 DIAGNOSIS — F4323 Adjustment disorder with mixed anxiety and depressed mood: Secondary | ICD-10-CM | POA: Diagnosis not present

## 2022-02-23 NOTE — Progress Notes (Signed)
Psychotherapy Progress Note [facility] [name], PhD LP  Patient ID: Sistine Haslett [facility])    MRN: [id number] Therapy format: Individual psychotherapy Date: 02/23/2022      Start: 6:17p     Stop: 7:05p     Time Spent: 48 min Location: In-person   Session narrative (presenting needs, interim history, self-report of stressors and symptoms, applications of prior therapy, status changes, and interventions made in session) Addressed various stress issues.  School back in session, stress of still working [employer], has not yet found out whether she can defer part of her requirement to next year.  M called while in session, asking about results of medical testing.  Slightly awkwardly let her know she's on speaker, in office.  In touch with Preston Fleeting, as a friend at this point.  Some worry that she just "dumped" on him.  (He didn't pick up a next call, made her wonder, especially in light of his habit of not initiating conversations.)  Assured it's OK either way, not  cause for working up apologies, better to go on and live into not being entwined as they were so she does not have to feel strung along again, angry like she felt again, the practice of lowered expectations both ways.    At school, a colleague attempted suicide by [place] weekend before, shocking news.  Actually almost apologetic for sharing it, but assured able to hear it.  Support/empathy provided.  Also has had a new student from [place] (female) adopt the habit of assertively telling her goodbye, regularly by name.  Feels manipulative to her.  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  Assured it doesn't have to become anything creepy, discussed ways to paint boundaries in communication with her, and if need be, take it as an invitation to draw out her feelings about being new there, any other needs she may have hidden behind the behavior, usher to  school counselor if indicated.  Therapeutic modalities: Cognitive Behavioral Therapy, Solution-Oriented/Positive Psychology, Ego-Supportive, and Assertiveness/Communication  Mental Status/Observations:  Appearance:   Casual     Behavior:  Appropriate  Motor:  Normal  Speech/Language:   Clear and Coherent  Affect:  Appropriate  Mood:  anxious  Thought process:  normal  Thought content:    WNL  Sensory/Perceptual disturbances:    WNL  Orientation:  Fully oriented  Attention:  Good    Concentration:  Good  Memory:  WNL  Insight:    Good  Judgment:   Good  Impulse Control:  Good   Risk Assessment: Danger to Self: No Self-injurious Behavior: No Danger to Others: No Physical Aggression / Violence: No Duty to Warn: No Access to Firearms a concern: No  Assessment of progress:  progressing  Diagnosis:   ICD-10-CM   1. Social anxiety disorder  F40.10     2. Adjustment disorder with mixed anxiety and depressed mood  F43.23     3. Relationship problem between parent and child  Z62.820     4. Relationship problem between (former) partners  Z63.0      Plan:  Physical/mental health -- Continue with diet/exercise program, notice if carb control helps headache and possible reactive hypoglycemia, possibly better anxiety management Anxiety coping -- Continue use of breathing techniques, practice acknowledgment of feelings and wishes Relationship issues -- Self-affirm sexual discretion, endorse retiring relationship with Preston Fleeting, self-affirm it's OK to want a relationship but not  be able to make it happen cleanly from just one side.  Otherwise prevent both selling out, and the need to prove she won't demonize, at the same time. Medication -- As needed, self-affirm OK to increase dose of Lexapro, reframe as "rightsizing" medication help, not "failing" in coping Family boundaries -- Self-affirm boundary setting with mother, encourage more clarity about how/when to take up requests.  OK to be  unwilling to [employer] and overly responsible plans made for her.   School issues -- Use available supports re. colleague and job issues, communication/boundary ideas with needy or challenging students. Other recommendations/advice as may be noted above Continue to utilize previously learned skills ad lib Maintain medication as prescribed and work faithfully with relevant prescriber(s) if any changes are desired or seem indicated Call the clinic on-call service, 988/hotline, 911, or present to [facility] or ER if any life-threatening psychiatric crisis Return for session(s) already scheduled. Already scheduled visit in this office 03/09/2022.  [name], PhD [name], PhD LP Clinical Psychologist, [facility] [address], [place], [place] [address] [phone]

## 2022-03-04 ENCOUNTER — Ambulatory Visit (INDEPENDENT_AMBULATORY_CARE_PROVIDER_SITE_OTHER): Payer: BC Managed Care – PPO

## 2022-03-04 ENCOUNTER — Encounter: Payer: Self-pay | Admitting: Pulmonary Disease

## 2022-03-04 ENCOUNTER — Ambulatory Visit: Payer: BC Managed Care – PPO | Admitting: Pulmonary Disease

## 2022-03-04 VITALS — BP 126/72 | HR 57 | Ht 68.5 in | Wt 230.0 lb

## 2022-03-04 DIAGNOSIS — R0602 Shortness of breath: Secondary | ICD-10-CM | POA: Diagnosis not present

## 2022-03-04 DIAGNOSIS — F419 Anxiety disorder, unspecified: Secondary | ICD-10-CM

## 2022-03-04 NOTE — Progress Notes (Signed)
Synopsis: Referred in October 2023 for intermittent shortness of breath.  Has a history of allergic rhinitis.  Subjective:   PATIENT ID: Joyce Gray GENDER: female DOB: April 22, 1976, MRN: [id number]   HPI  Chief Complaint  Patient presents with   Consult    Referred by PCP for SOB that started back in Summer 2023. States she last had an episode back in August.     Joyce Gray says taht in June she developed the sudden onset of dyspnea while she was sitting in her car.  She says that she called her primary care physician and they decided to evaluate with iron studies due to heavy menstrual cycles.  She says that she has been under a lot of stress.  She says that she had a few episodes of dyspnea when she was talking to a friend in the car.  She says the last episode was July.  No tightness or pain.  Had a dry cough.  She says that when she was younger she thought that she may have asthma, was prescribed an inhaler, didn't need it much.    No changes in her environment at work and school. She is at [employer] high school.  She says that the poor air quality days made her breathing worse.    She exercises 3-5 times a week.   She has had pneumonia 1-2 times in the past and has had bronchitis 1-2 times.     Record review: The patient was seen by her primary care physician in September 2023 Dr. Sudie Bailey.  During that visit there was mention of management of her depression and anxiety which is largely directed by psychiatry.  She was referred to [name] in the setting of intermittent shortness of breath and allergic rhinitis.  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     Family History  Problem Relation Age of Onset   Hyperlipidemia Mother    Alcohol abuse Mother    Melanoma Father    Melanoma Paternal Aunt 41   Heart  disease Maternal Grandfather    Breast cancer Maternal Aunt 63   Bipolar disorder Brother      Social History   Socioeconomic History   Marital status: Single    Spouse name: Not on file   Number of children: 0   Years of education: 16   Highest education level: Not on file  Occupational History   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 Fun/Hobby: Hiking, reading, coffee with friends, dancing.    Social Determinants of Health   Financial Resource Strain: Not on file  Food Insecurity: Not on file  Transportation Needs: Not on file  Physical Activity: Not on file  Stress: Not on file  Social Connections: Not on file  Intimate Partner Violence: Not on file     No Known Allergies   Outpatient Medications Prior  to Visit  Medication Sig Dispense Refill   escitalopram (LEXAPRO) 5 MG tablet Take 5 mg by mouth daily.     fluticasone (FLONASE) 50 MCG/ACT nasal spray Place 2 sprays into both nostrils daily. 16 g 6   GLUCOSAMINE-CHONDROITIN PO Take by mouth. Glucosamine 1500 mg & Chondroitin 120 mg     Multiple Vitamin (MULTI-VITAMIN DAILY PO) SMARTSIG:1 By Mouth     traZODone (DESYREL) 50 MG tablet Take 0.5-1 tablets (25-50 mg total) by mouth at bedtime as needed for sleep. 30 tablet 3   No facility-administered medications prior to visit.    Review of Systems  Constitutional:  Negative for chills, fever, malaise/fatigue and weight loss.  HENT:  Negative for congestion, nosebleeds, sinus pain and sore throat.   Eyes:  Negative for photophobia, pain and discharge.  Respiratory:  Positive for shortness of breath. Negative for cough,  hemoptysis, sputum production and wheezing.   Cardiovascular:  Negative for chest pain, palpitations, orthopnea and leg swelling.  Gastrointestinal:  Negative for abdominal pain, constipation, diarrhea, nausea and vomiting.  Genitourinary:  Negative for dysuria, frequency, hematuria and urgency.  Musculoskeletal:  Negative for back pain, joint pain, myalgias and neck pain.  Skin:  Negative for itching and rash.  Neurological:  Negative for tingling, tremors, sensory change, speech change, focal weakness, seizures, weakness and headaches.  Psychiatric/Behavioral:  Negative for memory loss, substance abuse and suicidal ideas. The patient is not nervous/anxious.       Objective:  Physical Exam   Vitals:   03/04/22 1550  BP: 126/72  Pulse: (!) 57  SpO2: 99%  Weight: 230 lb (104.3 kg)  Height: 5' 8.5" (1.74 m)   Gen: well appearing, no acute distress HENT: NCAT, OP clear, neck supple without masses Eyes: PERRL, EOMi Lymph: no cervical lymphadenopathy PULM: CTA B CV: RRR, no mgr, no JVD GI: BS+, soft, nontender, no hsm Derm: no rash or skin breakdown MSK: normal bulk and tone Neuro: A&Ox4, CN II-XII intact, strength 5/5 in all 4 extremities Psyche: normal mood and affect   CBC    Component Value Date/Time   WBC 9.2 02/10/2022 0902   RBC 4.28 02/10/2022 0902   HGB 12.8 02/10/2022 0902   HCT 39.1 02/10/2022 0902   PLT 328.0 02/10/2022 0902   MCV 91.4 02/10/2022 0902   MCH 30.3 07/10/2020 1609   MCHC 32.8 02/10/2022 0902   RDW 13.6 02/10/2022 0902   LYMPHSABS 3.0 02/10/2022 0902   MONOABS 0.6 02/10/2022 0902   EOSABS 0.3 02/10/2022 0902   BASOSABS 0.0 02/10/2022 0902     Chest imaging:  PFT:  Labs: September 2023 eosinophil 300 cells per microliter  Path:  Echo:  Heart Catheterization:  Ambulatory oximetry monitoring: 02/2022 walked 500 feet on room air and O2 saturation remained > 99%     Assessment & Plan:   Shortness of breath - Plan: DG Chest 2  View, Pulmonary function test  Anxiety  Discussion: Joyce Gray described episodes of shortness of breath this summer which were brought on by stress and poor air quality.  She has a history of asthma as a child.  Her physical exam is normal.  Oximetry is normal.  Today I told her that there was little evidence of an underlying lung disease, however given her history of asthma it is reasonable to get a lung function test and a chest x-ray today.  She understands that the differential diagnosis is broad and includes being overweight, having anxiety, among other heart and lung conditions.  Anemia may have contributed as well as her iron levels were low earlier this year.  Fortunately her symptoms have improved.  Plan: Shortness of breath: Ambulatory oximetry monitoring Chest x-ray Full pulmonary function testing Keep exercising as you are doing  We will bring you back in 4 to 6 weeks to go over the results of these test Come back and see me sooner if the shortness of breath recurs or worsens.   Immunizations: Immunization History  Administered Date(s) Administered   HPV 9-valent 11/22/2017, 01/23/2018, 05/24/2018   Influenza,inj,Quad PF,6+ Mos 02/18/2013, 06/21/2016, 12/26/2016, 04/30/2018, 03/15/2019, 02/10/2022   Influenza-Unspecified 02/20/2014   PFIZER(Purple Top)SARS-COV-2 Vaccination 07/28/2019, 08/18/2019, 02/15/2020   Td 05/23/2008   Tdap 03/15/2019     Current Outpatient Medications:    escitalopram (LEXAPRO) 5 MG tablet, Take 5 mg by mouth daily., Disp: , Rfl:    fluticasone (FLONASE) 50 MCG/ACT nasal spray, Place 2 sprays into both nostrils daily., Disp: 16 g, Rfl: 6   GLUCOSAMINE-CHONDROITIN PO, Take by mouth. Glucosamine 1500 mg & Chondroitin 120 mg, Disp: , Rfl:    Multiple Vitamin (MULTI-VITAMIN DAILY PO), SMARTSIG:1 By Mouth, Disp: , Rfl:    traZODone (DESYREL) 50 MG tablet, Take 0.5-1 tablets (25-50 mg total) by mouth at bedtime as needed for sleep., Disp: 30 tablet,  Rfl: 3

## 2022-03-04 NOTE — Patient Instructions (Signed)
Shortness of breath: Ambulatory oximetry monitoring Chest x-ray Full pulmonary function testing Keep exercising as you are doing  We will bring you back in 4 to 6 weeks to go over the results of these test Come back and see me sooner if the shortness of breath recurs or worsens.

## 2022-03-09 ENCOUNTER — Ambulatory Visit (INDEPENDENT_AMBULATORY_CARE_PROVIDER_SITE_OTHER): Payer: BC Managed Care – PPO | Admitting: Psychiatry

## 2022-03-09 DIAGNOSIS — F4323 Adjustment disorder with mixed anxiety and depressed mood: Secondary | ICD-10-CM | POA: Diagnosis not present

## 2022-03-09 DIAGNOSIS — F4010 Social phobia, unspecified: Secondary | ICD-10-CM

## 2022-03-09 DIAGNOSIS — Z630 Problems in relationship with spouse or partner: Secondary | ICD-10-CM

## 2022-03-09 NOTE — Progress Notes (Signed)
Psychotherapy Progress Note [facility] [name], PhD LP  Patient ID: Darienne Baus [facility])    MRN: [id number] Therapy format: Individual psychotherapy Date: 03/09/2022      Start: 5:17p     Stop: 6:06p     Time Spent: 49 min Location: In-person   Session narrative (presenting needs, interim history, self-report of stressors and symptoms, applications of prior therapy, status changes, and interventions made in session) On 10mg  Lexapro now and 25mg  trazodone qhs, but uneasy about the amounts.  Feels it may make her somewhat foggy.  F tells her she sounds good, but she has felt somewhat off.  Interpreted as small amounts still, so more likely anxiety about feeling differently than she got used to, plus feeling of her nervous system learning to drop down out of sustained arousal  and maybe catch up sleep deficit.  Re. Preston Fleeting, left a message telling him she hadn't heard and obviously he doesn't want to so much, then just recently go texted by him to tell her she's special to him.  Led to a conversation that tried to get a definition of relationship and intentions,   Re. 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  Reviewed handling.  No news on the colleague who attempted suicide.  Feels ragged, no further organized response from administration.  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  The intrusive student not provocative as she was, seems resolved.  Some teller's remorse for sharing the issue with her principal, but assured it's OK that she reports if she needs to.  If any concerns about how much is too much with principal, can ask that directly.  Therapeutic modalities: Cognitive Behavioral Therapy, Solution-Oriented/Positive Psychology, and Ego-Supportive  Mental Status/Observations:  Appearance:   Casual     Behavior:  Appropriate  Motor:   Normal  Speech/Language:   Clear and Coherent  Affect:  Appropriate  Mood:  anxious and less  Thought process:  normal  Thought content:    WNL  Sensory/Perceptual disturbances:    WNL  Orientation:  Fully oriented  Attention:  Good    Concentration:  Good  Memory:  WNL  Insight:    Good  Judgment:   Good  Impulse Control:  Good   Risk Assessment: Danger to Self: No Self-injurious Behavior: No Danger to Others: No Physical Aggression / Violence: No Duty to Warn: No Access to Firearms a concern: No  Assessment of progress:  progressing  Diagnosis:   ICD-10-CM   1. Social anxiety disorder  F40.10     2. Adjustment disorder with mixed anxiety and depressed mood  F43.23     3. Relationship problem between (former) partners  Z63.0      Plan:  Physical/mental health -- Continue with diet/exercise program, notice if carb control helps headache and possible reactive hypoglycemia, possibly better anxiety management Anxiety coping -- Continue use of breathing techniques, practice acknowledgment of feelings and wishes Relationship issues -- Self-affirm sexual discretion, endorse retiring relationship with Preston Fleeting, self-affirm it's OK to want a relationship but not be able to make it happen cleanly from just one side.  Otherwise prevent both selling out, and the need to prove she won't demonize, at the same time. Medication -- As needed, self-affirm OK to increase dose of Lexapro, reframe as "rightsizing" medication help, not "failing" in coping Family boundaries --  Self-affirm boundary setting with mother, encourage more clarity about how/when to take up requests.  OK to be unwilling to [employer] and overly responsible plans made for her.   School issues -- Use available supports re. colleague and job issues, communication/boundary ideas with needy or challenging students. Other recommendations/advice as may be noted above Continue to utilize previously learned skills ad lib Maintain  medication as prescribed and work faithfully with relevant prescriber(s) if any changes are desired or seem indicated Call the clinic on-call service, 988/hotline, 911, or present to [facility] or ER if any life-threatening psychiatric crisis No follow-ups on file. Already scheduled visit in this office 03/23/2022.  [name], PhD [name], PhD LP Clinical Psychologist, [facility] [address] [place], [place] [address] [phone]

## 2022-03-23 ENCOUNTER — Ambulatory Visit: Payer: BC Managed Care – PPO | Admitting: Psychiatry

## 2022-03-23 DIAGNOSIS — Z62820 Parent-biological child conflict: Secondary | ICD-10-CM

## 2022-03-23 DIAGNOSIS — F4323 Adjustment disorder with mixed anxiety and depressed mood: Secondary | ICD-10-CM

## 2022-03-23 DIAGNOSIS — F4010 Social phobia, unspecified: Secondary | ICD-10-CM

## 2022-03-23 NOTE — Progress Notes (Signed)
Psychotherapy Progress Note [facility] [name], PhD LP  Patient ID: Joyce Gray [facility])    MRN: [id number] Therapy format: Individual psychotherapy Date: 03/23/2022      Start: 5:10p     Stop: 6:00p     Time Spent: 50 min Location: In-person   Session narrative (presenting needs, interim history, self-report of stressors and symptoms, applications of prior therapy, status changes, and interventions made in session) Decided to try 10mg  Lexapro, working well, enabling her to drop trazodone, which she has very much wanted to do.  Has night gummies (melatonin and L-theanine) if needed.  Last night had some wakefulness, hormonally driven, got up and did a brain dump of bothersome things from the day then did a good meditation that involved thanking the day.  Worked every nicely to let her go to sleep.    School issue with feeling like the principal conspicuously did not talk to her, thought able to let it be a moment that may not repeat itself.  More salient issue with asst principal (Dr. Clovia Cuff), doing a class observation while blocking a doorway and eating a donut, and intervening in an activity she was going to have students use their phones (constructively).  He has some mannerisms difficult to understand as yet, maybe trying to establish his authority as a Primary school teacher.  Discussed reading new authority and tact in addressing misunderstandings or differences in judgment.  Carrying stress of mentoring another Pharmacist, hospital and continuing with [employer].  Has had a couple more unruly students, saying things like "Get out!" or "She's gonna get fired" trying to get a rise out of her.  Cautioned about getting baited into quibbling about the truth was, coached in reshaping behavior in an appealing way.  Suggestion made to implement "do overs" -- "otra vez mas" in Spanish class.  Therapeutic modalities: Cognitive Behavioral Therapy and Solution-Oriented/Positive  Psychology  Mental Status/Observations:  Appearance:   Casual     Behavior:  Appropriate  Motor:  Normal  Speech/Language:   Clear and Coherent  Affect:  Appropriate  Mood:  anxious and less  Thought process:  normal  Thought content:    WNL  Sensory/Perceptual disturbances:    WNL  Orientation:  Fully oriented  Attention:  Good    Concentration:  Good  Memory:  WNL  Insight:    Good  Judgment:   Good  Impulse Control:  Good   Risk Assessment: Danger to Self: No Self-injurious Behavior: No Danger to Others: No Physical Aggression / Violence: No Duty to Warn: No Access to Firearms a concern: No  Assessment of progress:  progressing  Diagnosis:   ICD-10-CM   1. Social anxiety disorder  F40.10     2. Adjustment disorder with mixed anxiety and depressed mood  F43.23     3. Relationship problem between parent and child  Z62.820      Plan:  Physical/mental health -- Continue with diet/exercise program, notice if carb control helps headache and possible reactive hypoglycemia, possibly better anxiety management Anxiety coping -- Continue use of breathing techniques, practice acknowledgment of feelings and wishes Relationship issues -- Self-affirm sexual discretion, endorse retiring relationship with Preston Fleeting, self-affirm it's OK to want a relationship but not be able to make it happen cleanly from just one side.  Otherwise prevent both selling out, and the need to prove she won't demonize, at the same time. Medication -- As needed, self-affirm OK to increase dose of Lexapro, reframe as "rightsizing" medication help, not "  failing" in coping.  Endorse use of melatonin and L-theanine, run by physician if any concerns. Family boundaries -- Self-affirm boundary setting with mother, encourage more clarity about how/when to take up requests.  OK to be unwilling to [employer] and overly responsible plans made for her.   School issues -- Use available supports re. colleague and job  issues, communication/boundary ideas with needy or challenging students. Other recommendations/advice as may be noted above Continue to utilize previously learned skills ad lib Maintain medication as prescribed and work faithfully with relevant prescriber(s) if any changes are desired or seem indicated Call the clinic on-call service, 988/hotline, 911, or present to [facility] or ER if any life-threatening psychiatric crisis Return for as already scheduled. Already scheduled visit in this office 04/06/2022.  [name], PhD [name], PhD LP Clinical Psychologist, [facility] [address] [place], [place] [address] [phone]

## 2022-03-29 ENCOUNTER — Ambulatory Visit: Payer: BC Managed Care – PPO | Admitting: Nurse Practitioner

## 2022-04-06 ENCOUNTER — Ambulatory Visit: Payer: BC Managed Care – PPO | Admitting: Psychiatry

## 2022-04-06 DIAGNOSIS — Z62820 Parent-biological child conflict: Secondary | ICD-10-CM | POA: Diagnosis not present

## 2022-04-06 DIAGNOSIS — F4323 Adjustment disorder with mixed anxiety and depressed mood: Secondary | ICD-10-CM

## 2022-04-06 DIAGNOSIS — F4010 Social phobia, unspecified: Secondary | ICD-10-CM | POA: Diagnosis not present

## 2022-04-06 NOTE — Progress Notes (Signed)
Psychotherapy Progress Note [facility] [name], PhD LP  Patient ID: Joyce Gray [facility])    MRN: [id number] Therapy format: Individual psychotherapy Date: 04/06/2022      Start: 5:13p     Stop: 6:02p     Time Spent: 49 min Location: In-person   Session narrative (presenting needs, interim history, self-report of stressors and symptoms, applications of prior therapy, status changes, and interventions made in session) A lot happening.  Conversation with dad a couple weeks ago over lunch where he revealed plans to go to the [place] [employer] [place] for Thanksgiving then had rapid-fire questions about her plans; the unsettling experience tends to set off wondering whether it was his agenda to confuse her or make her uncomfortable.  Reviewed the evidence, decided she was reading in too much intentionality, more likely he was just practicing his own nervous habits, rushing and trying to get her to commit something.  Bothered a bit by him telling her he loves her so much and thinks about her "all the time" -- felt temporarily icky, but at worst could be buttering her up for something to ask.  Reminded her mother has tried to demonize him, and sometimes she sells easily.  More bothered by how mom pushed her a few weeks ago to bring up the will with him, which ws more frankly manipulative again trying to make Joyce Gray the agent to get back at him.  Awkwardly , she did bring it up with her dad at this lunch (maybe felt duped?).  She learned about retirement funds, but biggest issue was in fact learning his house would go to his wife only.  Felt downgraded on hearing that, and uttered a few things that sparked some conflict, got him a bit defensive.  Processed a bit in session, affirming that he does have the right to decide how to provide for his current wife, too, that in his position, it's not just about the original family.  F/u to the administrator story -- overheard something that sounded  like snark about her smiling, and has felt that the principal might be dumping extra work on her.  Bothered by the idea that maybe her principal wants to get rid of her.  Encouraged her to notice such thoughts as temptation to turn cynical without good cause, possibly copying what bitterness she's been shown, when what she's actually dealing with are other people with anxieties and self-interests, maybe more blind to her concerns than she would wish, but much more likely tuned out than conniving.  Able to consider, accept.  Therapeutic modalities: Cognitive Behavioral Therapy, Solution-Oriented/Positive Psychology, and Ego-Supportive  Mental Status/Observations:  Appearance:   Casual     Behavior:  Appropriate  Motor:  Normal  Speech/Language:   Clear and Coherent  Affect:  Appropriate  Mood:  anxious  Thought process:  normal  Thought content:    Interpersonally sensitive, tempted toward paranoia  Sensory/Perceptual disturbances:    WNL  Orientation:  Fully oriented  Attention:  Good    Concentration:  Good  Memory:  WNL  Insight:    Good  Judgment:   Good  Impulse Control:  Good   Risk Assessment: Danger to Self: No Self-injurious Behavior: No Danger to Others: No Physical Aggression / Violence: No Duty to Warn: No Access to Firearms a concern: No  Assessment of progress:  stabilized  Diagnosis:   ICD-10-CM   1. Social anxiety disorder  F40.10     2. Adjustment disorder with mixed anxiety  and depressed mood  F43.23     3. Relationship problem between parent and child  Z62.820      Plan:  Physical health in service of mental health -- Continue with diet/exercise program, notice if carb control helps headache and possible reactive hypoglycemia, possibly better anxiety management Anxiety coping -- Continue use of breathing techniques, practice acknowledgment of feelings and wishes Relationship issues -- Self-affirm sexual discretion, endorse retiring relationship with  Preston Fleeting, self-affirm it's OK to want a relationship but not be able to make it happen cleanly from just one side.  Otherwise prevent both selling out, and the need to prove she won't demonize, at the same time. Medication -- As needed, self-affirm OK to increase dose of Lexapro, reframe as "rightsizing" medication help, not "failing" in coping.  Endorse use of melatonin and L-theanine, run by physician if any concerns. Family boundaries -- Self-affirm boundary setting with mother, encourage more clarity about how/when to take up requests.  OK to be unwilling to [employer] and overly responsible plans made for her.   School issues -- Use available supports re. colleague and job issues, communication/boundary ideas with needy or challenging students. Other recommendations/advice as may be noted above Continue to utilize previously learned skills ad lib Maintain medication as prescribed and work faithfully with relevant prescriber(s) if any changes are desired or seem indicated Call the clinic on-call service, 988/hotline, 911, or present to [facility] or ER if any life-threatening psychiatric crisis Return for as already scheduled. Already scheduled visit in this office 05/04/2022.  [name], PhD [name], PhD LP Clinical Psychologist, [facility] [address] [place], [place] [address] [phone]

## 2022-04-07 ENCOUNTER — Ambulatory Visit: Payer: BC Managed Care – PPO | Admitting: Nurse Practitioner

## 2022-04-18 ENCOUNTER — Ambulatory Visit: Payer: BC Managed Care – PPO | Admitting: Physician Assistant

## 2022-04-20 ENCOUNTER — Encounter: Payer: Self-pay | Admitting: Pulmonary Disease

## 2022-04-20 ENCOUNTER — Ambulatory Visit (INDEPENDENT_AMBULATORY_CARE_PROVIDER_SITE_OTHER): Payer: BC Managed Care – PPO | Admitting: Pulmonary Disease

## 2022-04-20 VITALS — BP 130/70 | HR 63 | Temp 97.7°F | Ht 68.5 in | Wt 228.8 lb

## 2022-04-20 DIAGNOSIS — F419 Anxiety disorder, unspecified: Secondary | ICD-10-CM | POA: Diagnosis not present

## 2022-04-20 DIAGNOSIS — R0602 Shortness of breath: Secondary | ICD-10-CM

## 2022-04-20 LAB — PULMONARY FUNCTION TEST
DL/VA % pred: 113 %
DL/VA: 4.82 ml/min/mmHg/L
DLCO cor % pred: 109 %
DLCO cor: 27.34 ml/min/mmHg
DLCO unc % pred: 109 %
DLCO unc: 27.34 ml/min/mmHg
FEF 25-75 Post: 4 L/sec
FEF 25-75 Pre: 2.99 L/sec
FEF2575-%Change-Post: 33 %
FEF2575-%Pred-Post: 124 %
FEF2575-%Pred-Pre: 92 %
FEV1-%Change-Post: 4 %
FEV1-%Pred-Post: 101 %
FEV1-%Pred-Pre: 97 %
FEV1-Post: 3.42 L
FEV1-Pre: 3.27 L
FEV1FVC-%Change-Post: 3 %
FEV1FVC-%Pred-Pre: 99 %
FEV6-%Change-Post: 0 %
FEV6-%Pred-Post: 98 %
FEV6-%Pred-Pre: 98 %
FEV6-Post: 4.04 L
FEV6-Pre: 4.03 L
FEV6FVC-%Change-Post: 0 %
FEV6FVC-%Pred-Post: 101 %
FEV6FVC-%Pred-Pre: 102 %
FVC-%Change-Post: 0 %
FVC-%Pred-Post: 96 %
FVC-%Pred-Pre: 95 %
FVC-Post: 4.05 L
FVC-Pre: 4.03 L
Post FEV1/FVC ratio: 84 %
Post FEV6/FVC ratio: 100 %
Pre FEV1/FVC ratio: 81 %
Pre FEV6/FVC Ratio: 100 %
RV % pred: 104 %
RV: 2.01 L
TLC % pred: 105 %
TLC: 6.02 L

## 2022-04-20 NOTE — Progress Notes (Signed)
PFT done today. 

## 2022-04-20 NOTE — Addendum Note (Signed)
Addended by: Loma Sousa on: 04/20/2022 04:46 PM   Modules accepted: Orders

## 2022-04-20 NOTE — Patient Instructions (Signed)
Difficulty breathing: Resolved, lung function testing, physical exam, and chest x-ray showed no evidence of underlying lung disease At this time there is no indication for further treatment or workup However, if you have any difficulty breathing please not hesitate to let us know  Will see you back on an as-needed basis

## 2022-04-20 NOTE — Progress Notes (Signed)
Synopsis: Referred in October 2023 for intermittent shortness of breath.  Has a history of allergic rhinitis.  Subjective:   PATIENT ID: Joyce Gray GENDER: female DOB: [birth year], MRN: [id number]   HPI  Chief Complaint  Patient presents with   Follow-up    Pft review     [name] says that her breathing has been doing OK She had an increase in her Lexapro and her anxiety has been better controlled She has some sinus congestion No diffulty breathing when she exercises No panic attacks  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    ROS    Objective:  Physical Exam   Vitals:   04/20/22 1403  BP: 130/70  Pulse: 63  Temp: 97.7 F (36.5 C)  TempSrc: Oral  SpO2: 100%  Weight: 228 lb 12.8 oz (103.8 kg)  Height: 5' 8.5" (1.74 m)   Gen: well appearing HENT: OP clear, neck supple PULM: CTA B, normal effort  CV: RRR, no mgr GI: BS+, soft, nontender Derm: no cyanosis or rash Psyche: normal mood and affect    CBC    Component Value Date/Time   WBC 9.2 02/10/2022 0902   RBC 4.28 02/10/2022 0902   HGB 12.8 02/10/2022 0902   HCT 39.1 02/10/2022 0902   PLT 328.0 02/10/2022 0902   MCV 91.4 02/10/2022 0902   MCH 30.3 07/10/2020 1609   MCHC 32.8 02/10/2022 0902   RDW 13.6 02/10/2022 0902   LYMPHSABS 3.0 02/10/2022 0902   MONOABS 0.6 02/10/2022 0902   EOSABS 0.3 02/10/2022 0902   BASOSABS 0.0 02/10/2022 0902     Chest imaging: Chest x-ray: October 2023 normal  PFT: November 2023 full PFT ratio 84%, FVC 4.05 L 96% predicted, total lung capacity 6.02 L 105% predicted, DLCO 27.34 109% predicted  Labs: September 2023 eosinophil 300 cells per microliter  Path:  Echo:  Heart Catheterization:  Ambulatory oximetry monitoring: 02/2022 walked 500 feet on room air and O2 saturation remained >  99%     Assessment & Plan:   Shortness of breath  Anxiety  Discussion: Henna's lung function test today was within normal limits.  She has had no breathing difficulty since the last visit.  Chest x-ray was within normal limits.  At this time there is no evidence of an underlying lung problem.  She had childhood asthma so I explained to her that though there is no evidence of that today if she has any sort of difficulty breathing she should come back in see [name] again.  Plan: Difficulty breathing: Resolved, lung function testing, physical exam, and chest x-ray showed no evidence of underlying lung disease At this time there is no indication for further treatment or workup However, if you have any difficulty breathing please not hesitate to let us know  Will see you back on an as-needed basis   Immunizations: Immunization History  Administered Date(s) Administered   HPV 9-valent 11/22/2017, 01/23/2018, 05/24/2018   Influenza,inj,Quad PF,6+ Mos 02/18/2013, 06/21/2016, 12/26/2016, 04/30/2018, 03/15/2019, 02/10/2022   Influenza-Unspecified 02/20/2014   PFIZER(Purple Top)SARS-COV-2 Vaccination 07/28/2019, 08/18/2019, 02/15/2020   Td 05/23/2008   Tdap 03/15/2019     Current Outpatient Medications:    escitalopram (LEXAPRO) 10 MG tablet, Take 10 mg by mouth daily., Disp: , Rfl:    fluticasone (FLONASE) 50 MCG/ACT  nasal spray, Place 2 sprays into both nostrils daily., Disp: 16 g, Rfl: 6   GLUCOSAMINE-CHONDROITIN PO, Take by mouth. Glucosamine 1500 mg & Chondroitin 120 mg, Disp: , Rfl:    Multiple Vitamin (MULTI-VITAMIN DAILY PO), SMARTSIG:1 By Mouth, Disp: , Rfl:    traZODone (DESYREL) 50 MG tablet, Take 0.5-1 tablets (25-50 mg total) by mouth at bedtime as needed for sleep., Disp: 30 tablet, Rfl: 3   escitalopram (LEXAPRO) 5 MG tablet, Take 10 mg by mouth daily. (Patient not taking: Reported on 04/20/2022), Disp: , Rfl:

## 2022-05-04 ENCOUNTER — Ambulatory Visit: Payer: BC Managed Care – PPO | Admitting: Psychiatry

## 2022-05-04 DIAGNOSIS — Z62820 Parent-biological child conflict: Secondary | ICD-10-CM

## 2022-05-04 DIAGNOSIS — F4323 Adjustment disorder with mixed anxiety and depressed mood: Secondary | ICD-10-CM

## 2022-05-04 DIAGNOSIS — Z630 Problems in relationship with spouse or partner: Secondary | ICD-10-CM

## 2022-05-04 DIAGNOSIS — F4010 Social phobia, unspecified: Secondary | ICD-10-CM

## 2022-05-04 NOTE — Progress Notes (Signed)
Psychotherapy Progress Note [facility] [name], PhD LP  Patient ID: Joyce Gray [facility])    MRN: [id number] Therapy format: Individual psychotherapy Date: 05/04/2022      Start: 5:03p     Stop: 5:52p     Time Spent: 49 min Location: In-person   Session narrative (presenting needs, interim history, self-report of stressors and symptoms, applications of prior therapy, status changes, and interventions made in session) EHR shows recent pulmonary visit for f/u on shortness of breath, with normal functioning, no evidence of underlying disease, f/u PRN, impression it was either temporary infection-related or anxiety-related.    Figuring to take a break from therapy after this session and next.  It has definitely become easier to open up, and to put herself into healthy challenges like [employer], and definitely to have a space to bring things out and take stock.  Would like to trust herself more by keeping her own counsel and coming back as needed.  Affirmed and approved.  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  Confirmed she is not too tempted to react dysphorically to it, and agreed she can if she wishes, solicit feedback, take it all under advisement and decide what makes sense and what is distorted.  Re. [employer], good news, got back results on 1st 2 components, with middling but passing grades on some tested elements (having spent limited study time) but strong results on her portfolio.  Has 2 components ahead of her, anticipates working through over the next 2 years.  Glad to be encouraged.  Discussed worrisome impressions and a bit of embarrassment with a colleague who scored a bit better.  Re. family -- mother is in some "drama" at her senior center, now interested in a man who is also spending some time with another woman, wrestling with whether to take time off.  Agreed she is stressing herself with  expectations, inevitably like a new teenager, and discussed ways to help her modulate.  Preston Fleeting -- Had recent interaction, working back toward a more active friendship.  Feels confident she will not get her feelings too hurt and is wiling to take risks.  Meds -- Found she could reduce Lexapro back to 5mg  without losing sleep or becoming anxious.  Credit in part to her achievements, certainly, part to exercise, part to support/companionship of Antony.  Affirmed and encouraged.  Therapeutic modalities: Cognitive Behavioral Therapy, Solution-Oriented/Positive Psychology, and Ego-Supportive  Mental Status/Observations:  Appearance:   Casual     Behavior:  Appropriate  Motor:  Normal  Speech/Language:   Clear and Coherent  Affect:  Appropriate  Mood:  anxious and less  Thought process:  normal  Thought content:    WNL  Sensory/Perceptual disturbances:    WNL  Orientation:  Fully oriented  Attention:  Good    Concentration:  Good  Memory:  WNL  Insight:    Good  Judgment:   Good  Impulse Control:  Good   Risk Assessment: Danger to Self: No Self-injurious Behavior: No Danger to Others: No Physical Aggression / Violence: No Duty to Warn: No Access to Firearms a concern: No  Assessment of progress:  progressing well  Diagnosis:   ICD-10-CM   1. Social anxiety disorder  F40.10     2. Adjustment disorder with mixed anxiety and depressed mood  F43.23     3. Relationship problem between parent and child  Z62.820     4. Relationship problem between (former)  partners  Z63.0      Plan:  Physical/mental health -- Continue with diet/exercise program, notice if carb control helps headache and possible reactive hypoglycemia, possibly better anxiety management Anxiety coping -- Continue use of breathing techniques, practice acknowledgment of feelings and wishes Relationship issues -- Self-affirm sexual discretion, endorse discretion in relationship with Preston Fleeting, just eyes open whether she  is expecting things she does not communicate adequately and whether she is keeping out of double standards.   Medication -- OK to manage dosing, staying in touch with prescriber.  If more, it's not a failure, it's "rightsizing".  Endorse use of melatonin and L-theanine, run by physician if any concerns. Family boundaries -- Self-affirm boundary setting with mother, encourage more clarity about how/when to take up requests.  OK to be unwilling to [employer] and overly responsible plans made for her.  Same with father, insofar as it comes up.  Always OK to state her own wishes for changing interactions, best practice to ask listening first. School issues -- Use available supports re. colleague and job issues, communication/boundary ideas with needy or challenging students. Other recommendations/advice as may be noted above Continue to utilize previously learned skills ad lib Maintain medication as prescribed and work faithfully with relevant prescriber(s) if any changes are desired or seem indicated Call the clinic on-call service, 988/hotline, 911, or present to [facility] or ER if any life-threatening psychiatric crisis Return for time as already scheduled. Already scheduled visit in this office 05/11/2022.  [name], PhD [name], PhD LP Clinical Psychologist, [facility] [address], [place], [place] [address] [phone]

## 2022-05-11 ENCOUNTER — Ambulatory Visit: Payer: BC Managed Care – PPO | Admitting: Psychiatry

## 2022-05-11 DIAGNOSIS — Z62820 Parent-biological child conflict: Secondary | ICD-10-CM

## 2022-05-11 DIAGNOSIS — F4010 Social phobia, unspecified: Secondary | ICD-10-CM

## 2022-05-11 DIAGNOSIS — Z630 Problems in relationship with spouse or partner: Secondary | ICD-10-CM | POA: Diagnosis not present

## 2022-05-11 DIAGNOSIS — F4323 Adjustment disorder with mixed anxiety and depressed mood: Secondary | ICD-10-CM | POA: Diagnosis not present

## 2022-05-11 DIAGNOSIS — Z5689 Other problems related to employment: Secondary | ICD-10-CM

## 2022-05-11 NOTE — Progress Notes (Signed)
Psychotherapy Progress Note [facility] Joyce Moore, PhD LP  Patient ID: Joyce Gray [facility])    MRN: [id number] Therapy format: Individual psychotherapy Date: 05/11/2022      Start: 5:08p     Stop: 5:58p     Time Spent: 50 min Location: In-person   Session narrative (presenting needs, interim history, self-report of stressors and symptoms, applications of prior therapy, status changes, and interventions made in session) Some discouraging word that her grades on [employer] are actually a bit off the pace for passing.  May be rethinking to study for her master's instead.  Father tends to counsel her not to risk getting too stressed, rather than, say, just validated her abilities and choices.  Reframed in session as his caring about her, albeit awkwardly, and probably becoming a bit codependent with her, dreaming up what her masters thesis might be, etc.  Says she did pretty well at a birthday dinner with him, speaking for it when father forgot a deal about when to visit, and not getting steamed about it.  Affirmed assertiveness, encouraged in seeing herself, as needed, as growing up more, actually.  Reaffirmed decision to close therapy for a while. Feels adequate to the things she means to do, exercising regularly, in better spirits.  Briefed on process if she chooses to return later and commended on good work done developing insight, boundaries, assertiveness, humanity with self.  Therapeutic modalities: Cognitive Behavioral Therapy, Solution-Oriented/Positive Psychology, and Ego-Supportive  Mental Status/Observations:  Appearance:   Casual     Behavior:  Appropriate  Motor:  Normal  Speech/Language:   Clear and Coherent  Affect:  Negative and Appropriate  Mood:  anxious and less  Thought process:  normal  Thought content:    WNL  Sensory/Perceptual disturbances:    WNL  Orientation:  Fully oriented  Attention:  Good    Concentration:  Good  Memory:  WNL   Insight:    Good  Judgment:   Good  Impulse Control:  Good   Risk Assessment: Danger to Self: No Self-injurious Behavior: No Danger to Others: No Physical Aggression / Violence: No Duty to Warn: No Access to Firearms a concern: No  Assessment of progress:  progressing well  Diagnosis:   ICD-10-CM   1. Social anxiety disorder  F40.10    stable    2. Adjustment disorder with mixed anxiety and depressed mood  F43.23    resolved    3. Relationship problem between (former) partners  Z63.0    resolved    4. Relationship problem between parent and child  Z62.820    stable    5. Career choice problem  Z56.89    stable     Plan:  Carry on with prior recommendations and learnings independently Other recommendations/advice as may be noted above Continue to utilize previously learned skills ad lib Maintain medication as prescribed and work faithfully with relevant prescriber(s) if any changes are desired or seem indicated Call the clinic on-call service, 988/hotline, 911, or present to [facility] or ER if any life-threatening psychiatric crisis Return for time at discretion. Already scheduled visit in this office Visit date not found.  [name], PhD Joyce Moore, PhD LP Clinical Psychologist, [facility] [address], [place], [place] [address] [phone]

## 2022-06-02 ENCOUNTER — Ambulatory Visit (INDEPENDENT_AMBULATORY_CARE_PROVIDER_SITE_OTHER): Payer: BC Managed Care – PPO | Admitting: Nurse Practitioner

## 2022-06-02 ENCOUNTER — Other Ambulatory Visit (HOSPITAL_COMMUNITY)
Admission: RE | Admit: 2022-06-02 | Discharge: 2022-06-02 | Disposition: A | Discharge status: Home / Self Care | Payer: BC Managed Care – PPO | Source: Ambulatory Visit | Attending: Nurse Practitioner | Admitting: Nurse Practitioner

## 2022-06-02 ENCOUNTER — Encounter: Payer: Self-pay | Admitting: Nurse Practitioner

## 2022-06-02 VITALS — BP 122/78 | Ht 69.0 in | Wt 231.0 lb

## 2022-06-02 DIAGNOSIS — R87810 Cervical high risk human papillomavirus (HPV) DNA test positive: Secondary | ICD-10-CM | POA: Diagnosis present

## 2022-06-02 DIAGNOSIS — N920 Excessive and frequent menstruation with regular cycle: Secondary | ICD-10-CM

## 2022-06-02 DIAGNOSIS — Z01419 Encounter for gynecological examination (general) (routine) without abnormal findings: Secondary | ICD-10-CM | POA: Diagnosis not present

## 2022-06-02 DIAGNOSIS — D259 Leiomyoma of uterus, unspecified: Secondary | ICD-10-CM

## 2022-06-02 MED ORDER — NORETHINDRONE 0.35 MG PO TABS: 1.0000 | ORAL_TABLET | Freq: Every day | ORAL | 3 refills | Status: DC

## 2022-06-02 NOTE — Progress Notes (Signed)
Joyce Gray 11/22/1975 [id number]   History:  47 y.o. G0 presents for annual exam. Monthly cycles but they are becoming heavier. On iron supplement now for low ferritin. Would like to discuss management. Myfembree recommended elsewhere but she read up on the side effects and wants to avoid for now. H/O uterine fibroids. 1999 cryosurgery, 2016 ASCUS pap with +HR HPV, 2020 normal cytology with +HR HPV 2020/2022.   Gynecologic History Patient's last menstrual period was 05/16/2022. Period Cycle (Days): 26 Period Duration (Days): 5 Period Pattern: Regular Menstrual Flow: Moderate Menstrual Control: Maxi pad, Tampon Dysmenorrhea: (!) Mild Dysmenorrhea Symptoms: Cramping Contraception/Family planning: none Sexually active: Infrequent  Health Maintenance Last Pap: 04/06/2021. Results were: Normal cytology + HR HPV Last mammogram: 2023. Results were: Normal per patient Last colonoscopy: Not indicated Last Dexa: Not indicated  Past medical history, past surgical history, family history and social history were all reviewed and documented in the EPIC chart. 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   ROS:  A ROS was performed and pertinent positives and negatives are included.  Exam:  Vitals:   06/02/22 1410  BP: 122/78  Weight: 231 lb (104.8 kg)  Height: '5\' 9"'$  (1.753 m)   Body mass index is 34.11 kg/m.  General appearance:  Normal Thyroid:  Symmetrical, normal in size, without palpable masses or nodularity. Respiratory  Auscultation:  Clear without wheezing or rhonchi Cardiovascular  Auscultation:  Regular rate, without rubs, murmurs or gallops  Edema/varicosities:  Not grossly evident Abdominal  Soft,nontender, without masses, guarding or rebound.  Liver/spleen:  No organomegaly noted  Hernia:  None appreciated  Skin  Inspection:  Grossly normal Breasts: Examined lying and sitting.   Right: Without masses, retractions, nipple discharge or axillary adenopathy.   Left: Without masses,  retractions, nipple discharge or axillary adenopathy. Genitourinary   Inguinal/mons:  Normal without inguinal adenopathy  External genitalia:  Normal appearing vulva with no masses, tenderness, or lesions  BUS/Urethra/Skene's glands:  Normal  Vagina:  Normal appearing with normal color and discharge, no lesions  Cervix:  Normal appearing without discharge or lesions  Uterus:  Normal in size, shape and contour.  Midline and mobile, nontender  Adnexa/parametria:     Rt: Normal in size, without masses or tenderness.   Lt: Normal in size, without masses or tenderness.  Anus and perineum: Normal  Patient informed chaperone available to be present for breast and pelvic exam. Patient has requested no chaperone to be present. Patient has been advised what will be completed during breast and pelvic exam.   Assessment/Plan:  47 y.o. G0 for annual exam.   Well female exam with routine gynecological exam - Education provided on SBEs, importance of preventative screenings, current guidelines, high calcium diet, regular exercise, and multivitamin daily.  Labs with PCP.   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   Menorrhagia with regular cycle - Plan: norethindrone (ORTHO MICRONOR) 0.35 MG tablet daily. Aware of proper use.   Uterine leiomyoma, unspecified location - Plan: norethindrone (ORTHO MICRONOR) 0.35 MG tablet daily.   Screening for cervical cancer - 2016 ASCUS pap with +HR HPV, 2020 normal cytology with +HR HPV 2020 (neg 16/18/45), 2021 normal neg HPV, 03/2021 normal cytology + HR HPV. Pap today per guidelines.   Screening for breast cancer - Normal mammogram history.  Continue annual screenings.  Normal breast exam today.  Screening for colon cancer - Discussed current guidelines and recommendations to start screenings at age  47. Plans to schedule colonoscopy during summer break.   Return in 1 year for  annual.      Tamela Gammon DNP, 3:12 PM 06/02/2022

## 2022-06-07 LAB — CYTOLOGY - PAP
Chlamydia: NEGATIVE
Comment: NEGATIVE
Comment: NEGATIVE
Comment: NEGATIVE
Comment: NORMAL
Diagnosis: NEGATIVE
High risk HPV: NEGATIVE
Neisseria Gonorrhea: NEGATIVE
Trichomonas: NEGATIVE

## 2022-07-20 ENCOUNTER — Ambulatory Visit: Payer: BC Managed Care – PPO | Admitting: Family Medicine

## 2022-07-20 ENCOUNTER — Encounter: Payer: Self-pay | Admitting: Family Medicine

## 2022-07-20 VITALS — BP 110/80 | HR 69 | Temp 97.6°F | Ht 69.0 in | Wt 228.4 lb

## 2022-07-20 DIAGNOSIS — N920 Excessive and frequent menstruation with regular cycle: Secondary | ICD-10-CM | POA: Diagnosis not present

## 2022-07-20 LAB — CBC WITH DIFFERENTIAL/PLATELET
Basophils Absolute: 0 10*3/uL (ref 0.0–0.1)
Basophils Relative: 0.4 % (ref 0.0–3.0)
Eosinophils Absolute: 0.4 10*3/uL (ref 0.0–0.7)
Eosinophils Relative: 3.8 % (ref 0.0–5.0)
HCT: 39.2 % (ref 36.0–46.0)
Hemoglobin: 13.4 g/dL (ref 12.0–15.0)
Lymphocytes Relative: 36.6 % (ref 12.0–46.0)
Lymphs Abs: 3.4 10*3/uL (ref 0.7–4.0)
MCHC: 34.1 g/dL (ref 30.0–36.0)
MCV: 88.1 fl (ref 78.0–100.0)
Monocytes Absolute: 0.5 10*3/uL (ref 0.1–1.0)
Monocytes Relative: 5.2 % (ref 3.0–12.0)
Neutro Abs: 5 10*3/uL (ref 1.4–7.7)
Neutrophils Relative %: 54 % (ref 43.0–77.0)
Platelets: 345 10*3/uL (ref 150.0–400.0)
RBC: 4.45 Mil/uL (ref 3.87–5.11)
RDW: 12.8 % (ref 11.5–15.5)
WBC: 9.3 10*3/uL (ref 4.0–10.5)

## 2022-07-20 LAB — IBC + FERRITIN
Ferritin: 31.4 ng/mL (ref 10.0–291.0)
Iron: 117 ug/dL (ref 42–145)
Saturation Ratios: 26 % (ref 20.0–50.0)
TIBC: 449.4 ug/dL (ref 250.0–450.0)
Transferrin: 321 mg/dL (ref 212.0–360.0)

## 2022-07-20 LAB — VITAMIN B12: Vitamin B-12: 746 pg/mL (ref 211–911)

## 2022-07-20 MED ORDER — ONDANSETRON HCL 4 MG PO TABS: 4.0000 mg | ORAL_TABLET | Freq: Three times a day (TID) | ORAL | 0 refills | Status: DC | PRN

## 2022-07-20 MED ORDER — MEDROXYPROGESTERONE ACETATE 10 MG PO TABS: 10.0000 mg | ORAL_TABLET | Freq: Three times a day (TID) | ORAL | 0 refills | Status: DC

## 2022-07-20 NOTE — Progress Notes (Signed)
Labs ok-great   continue iron 2/day.   See gyn.  Make sure drinking plenty of water.

## 2022-07-20 NOTE — Progress Notes (Signed)
Subjective:     Patient ID: Joyce Gray, female    DOB: 08-02-1975, 47 y.o.   MRN: [id number]  Chief Complaint  Patient presents with   Menstrual Problem    Heavy menstrual bleeding since 07/16/22, feeling fatigued and faint    HPI  Heavy menses since 2/24-heavy periods for 7 yrs.  Has mult fibroids.  May still want children.  Has been exercising and working on diet.  Saw gyn in Aug.   Pap in Jan.  Started [place] and heavy, then ocp for 1 mo and still heavy menses tiesterella in Jan.  Pt called Dr. Philis Pique.  My fembree 2/21 .  Menses 2/11 and still bleeding.     Feeling a little "down".   On 2/24-jogging on treadmill and felt faint and sat on floor. So stopped frmbree on 2/24.   Working on getting appt w/gso gyn.  Heavy bleeding since 2/24-large pad every 2-3 hrs-and night 2x. Last pm hard to do things needed to-tired and dizzy.    Taking 1 iron pill/day since August and then increased to 2/d for 1 month '65mg'$   the tranaz acid-not working in past.   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     GLUCOSAMINE-CHONDROITIN PO Take by mouth. Glucosamine 1500 mg & Chondroitin 120 mg     Multiple Vitamin (MULTI-VITAMIN DAILY PO) SMARTSIG:1 By Mouth     traZODone (DESYREL) 50 MG tablet Take 0.5-1 tablets (25-50 mg total) by mouth at bedtime as needed for sleep. 30 tablet 3   fluticasone (FLONASE) 50 MCG/ACT nasal spray Place 2 sprays  into both nostrils daily. 16 g 6   MYFEMBREE 40-1-0.5 MG TABS Take 1 tablet by mouth daily. (Patient not taking: Reported on 07/20/2022)     norethindrone (ORTHO MICRONOR) 0.35 MG tablet Take 1 tablet (0.35 mg total) by mouth daily. 84 tablet 3   No facility-administered medications prior to visit.    No Known Allergies ROS neg/noncontributory except as noted HPI/below      Objective:     BP 110/80   Pulse 69   Temp 97.6 F (36.4 C) (Temporal)   Ht '5\' 9"'$  (1.753 m)   Wt 228 lb 6 oz (103.6 kg)   LMP 07/16/2022 (Exact Date)   SpO2 99%   BMI 33.73 kg/m  Wt Readings from Last 3 Encounters:  07/20/22 228 lb 6 oz (103.6 kg)  06/02/22 231 lb (104.8 kg)  04/20/22 228 lb 12.8 oz (103.8 kg)    Physical Exam   Gen: WDWN NAD HEENT: NCAT, conjunctiva not injected, sclera nonicteric.  Conjunctiva not pale NECK:  supple, no thyromegaly, no nodes, no carotid bruits CARDIAC: RRR, S1S2+, no murmur. DP 2+B due to orthostatic blood pressures-not orthostatic.  Heart rate does increase some. LUNGS: CTAB. No wheezes ABDOMEN:  BS+, soft, NTND, No HSM,  no masses EXT:  no edema MSK: no gross abnormalities.  NEURO: A&O x3.  CN II-XII intact.  PSYCH: anxious mood. Good eye contact     Assessment & Plan:   Problem List Items Addressed This Visit   None Visit Diagnoses     Menorrhagia with regular cycle    -  Primary   Relevant Orders   CBC with Differential/Platelet (Completed)   IBC + Ferritin (Completed)   Vitamin B12 (Completed)     1.  Menorrhagia-has been on multiple meds intermittently.  Nothing seems to be working well.  She is trying to get in with another GYN.  Needs to have something definitive done.  First thought might want to preserve fertility, however reconsidering.  We will try Provera 10 mg 3 times daily for 10 days.  Will check CBC, iron studies, B12 stat.  She is concerned that she may need a transfusion.  Discussed the process.  Advised to drink plenty of fluids,  continue iron.  Will take her off work a few days till we get studies back and to try to rest as she was having a lot of dizziness including today.  Warning symptoms discussed for when she would need to go to the emergency room.  Meds ordered this encounter  Medications   medroxyPROGESTERone (PROVERA) 10 MG tablet    Sig: Take 1 tablet (10 mg total) by mouth 3 (three) times daily.    Dispense:  30 tablet    Refill:  0   ondansetron (ZOFRAN) 4 MG tablet    Sig: Take 1 tablet (4 mg total) by mouth every 8 (eight) hours as needed for nausea or vomiting.    Dispense:  20 tablet    Refill:  0    Wellington Hampshire, MD

## 2022-07-20 NOTE — Patient Instructions (Signed)
Provera 3x/day for 10 days

## 2022-07-23 ENCOUNTER — Encounter: Payer: Self-pay | Admitting: Family Medicine

## 2022-09-01 ENCOUNTER — Ambulatory Visit: Payer: BC Managed Care – PPO | Admitting: Physician Assistant

## 2022-09-01 ENCOUNTER — Encounter: Payer: Self-pay | Admitting: Physician Assistant

## 2022-09-01 VITALS — BP 110/66 | HR 60 | Temp 98.2°F | Resp 16 | Ht 69.0 in | Wt 233.2 lb

## 2022-09-01 DIAGNOSIS — L03213 Periorbital cellulitis: Secondary | ICD-10-CM | POA: Diagnosis not present

## 2022-09-01 MED ORDER — AMOXICILLIN-POT CLAVULANATE 875-125 MG PO TABS: 1.0000 | ORAL_TABLET | Freq: Two times a day (BID) | ORAL | 0 refills | Status: AC

## 2022-09-01 MED ORDER — MOXIFLOXACIN HCL 0.5 % OP SOLN: 1.0000 [drp] | Freq: Three times a day (TID) | OPHTHALMIC | 0 refills | Status: AC

## 2022-09-01 NOTE — Progress Notes (Signed)
Subjective:    Patient ID: Joyce Gray, female    DOB: 1975/08/26, 47 y.o.   MRN: [id number]  Chief Complaint  Patient presents with   Stye    Left eye, came up on Monday. No improvement with OTC stye cream     HPI Patient is in today for left eye ?stye  Stinging / puffy, more painful this morning. OTC stye ointment not helping. Seasonal allergies this time of year. Some cough with pollen. No vision changes. No blurred or double vision.  Wears glasses. Has some rewetting drops at home. Hx of dry eye.   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    Past Surgical History:  Procedure Laterality Date   CRYOTHERAPY  1999   for abnormal pap smear   TYMPANOSTOMY TUBE PLACEMENT      Family History  Problem Relation Age of Onset   Hyperlipidemia Mother    Alcohol abuse Mother    Melanoma Father    Melanoma Paternal Aunt 64   Heart disease Maternal Grandfather    Breast cancer Maternal Aunt 48   Bipolar disorder Brother     Social History   Tobacco Use   Smoking status: Never    Passive exposure: Past   Smokeless tobacco: Never  Vaping Use   Vaping Use: Never used  Substance Use Topics   Alcohol use: Not Currently    Comment: Rare   Drug use: No     No Known Allergies  Review of Systems NEGATIVE UNLESS OTHERWISE INDICATED IN HPI      Objective:     BP 110/66   Pulse 60   Temp 98.2 F (36.8 C) (Temporal)   Resp 16   Ht 5\' 9"  (1.753 m)   Wt 233 lb 3.2 oz (105.8 kg)   SpO2 98%   BMI 34.44 kg/m   Wt Readings from Last 3 Encounters:  09/01/22 233 lb 3.2 oz (105.8 kg)  07/20/22 228 lb 6 oz (103.6 kg)  06/02/22 231 lb (104.8 kg)    BP Readings from Last 3 Encounters:  09/01/22 110/66  07/20/22 110/80  06/02/22 122/78     Physical Exam Vitals and nursing note reviewed.   Constitutional:      Appearance: Normal appearance.  HENT:     Right Ear: Tympanic membrane normal. There is no impacted cerumen.     Left Ear: Tympanic membrane normal. There is no impacted cerumen.     Nose: Nose normal.     Mouth/Throat:     Mouth: Mucous membranes are moist.     Pharynx: Oropharynx is clear.  Eyes:     Extraocular Movements: Extraocular movements intact.     Conjunctiva/sclera: Conjunctivae normal.     Pupils: Pupils are equal, round, and reactive to light.     Comments: Left upper eyelid is edematous  Vision is normal   Neurological:     General: No focal deficit present.     Mental Status: She is alert and oriented to person, place, and time.  Psychiatric:        Mood and Affect: Mood normal.        Assessment & Plan:  Preseptal cellulitis of left upper eyelid  Other orders -     Amoxicillin-Pot Clavulanate; Take 1 tablet by  mouth 2 (two) times daily for 5 days. Take with food.  Dispense: 10 tablet; Refill: 0 -     Moxifloxacin HCl; Place 1 drop into the left eye 3 (three) times daily for 5 days.  Dispense: 3 mL; Refill: 0   No red flags on exam No signs of orbital cellulitis Will start on Augmentin and moxifloxacin drops as directed Warm compresses  Monitor closely  ED if acutely worse. Pt understanding and agreeable.     Sharol Croghan M Florentina Marquart, PA-C

## 2022-10-12 ENCOUNTER — Ambulatory Visit (HOSPITAL_BASED_OUTPATIENT_CLINIC_OR_DEPARTMENT_OTHER): Payer: BC Managed Care – PPO | Admitting: Obstetrics & Gynecology

## 2022-10-20 ENCOUNTER — Ambulatory Visit: Payer: BC Managed Care – PPO | Admitting: Psychiatry

## 2022-10-20 DIAGNOSIS — R69 Illness, unspecified: Secondary | ICD-10-CM

## 2022-10-20 DIAGNOSIS — F4323 Adjustment disorder with mixed anxiety and depressed mood: Secondary | ICD-10-CM

## 2022-10-20 DIAGNOSIS — Z62820 Parent-biological child conflict: Secondary | ICD-10-CM

## 2022-10-20 DIAGNOSIS — F4010 Social phobia, unspecified: Secondary | ICD-10-CM

## 2022-10-20 DIAGNOSIS — Z566 Other physical and mental strain related to work: Secondary | ICD-10-CM | POA: Diagnosis not present

## 2022-10-20 DIAGNOSIS — Z630 Problems in relationship with spouse or partner: Secondary | ICD-10-CM

## 2022-10-20 NOTE — Progress Notes (Signed)
Psychotherapy Progress Note [facility] Marliss Czar, PhD LP  Patient ID: Joyce Gray [facility]    MRN: [id number] Therapy format: Individual psychotherapy Date: 10/20/2022      Start: 1:08p     Stop: 1:58p     Time Spent: 50 min Location: In-person   Session narrative (presenting needs, interim history, self-report of stressors and symptoms, applications of prior therapy, status changes, and interventions made in session) Tried going PRN, stresses built up.  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  Probed whether she might be more prone to paranoia under stress, realizes she could take it more sensitively when more unders stress.  Today knows she could be about 50% less affected.  Reflecting on what she knows about the woman, realized she is in the midst of a divorce, and most likely false-alarming about things that look different from her expectations.  Completely relieved the worry.  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  Led in rational review (evidence for/against) of the intrusive idea they might be forcing her out, agreed it's implausible.  Also notes hearing a Information systems manager, "She's going to get fired", which provoked her to worry, but on review, agrees that it's just one more maneuver a disgruntled adolescent can use to shake a teacher's tree, having heard from another [place] teacher that they told him that.  Envies his ability to roll with it and make light of personal attacks like that, but recognizes it's really just either a teenager acting out or a poise test for authority from an anxious young person who can't afford to admit  it.  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  No complaints.  Relationships -- It's over with Oneita Kras for about 4 months now.  Got a spate of suspicious texts from different unknown numbers after breaking up, eventually texted him directly to check if he was behind it, didn't know what she was talking about.  Still hard to put away doubt, since they started after breakup and stopped after confrontation, but encouraged to consider that it could have been simultaneously targeted by scammers (smishing) an they et up when she didn't respond.  Quandary whether to change her number, suggested she just make use of blocking feature, since they did stop, and it would be a large hassle to change her number with everyone she knows.  Family -- no present complaints  Physical self-care -- Exercising regularly.  Financial -- Got an appreciated bonus, feels able to choose possibly to take two trips this summer.  [facility] -- Unsure whether to book a series of appts or not.  Mother says she needs to be back in therapy but no detail, and the source is suspect.  Best guess it has to do with complaining about stress.  Encouraged to some assertive inquiry to better determine whether it's just tired of hearing some things or some more serious observation, and from there whether it makes sense to [facility] to hire services or just mind her choices with a difficult audience.  Therapeutic modalities: Cognitive Behavioral Therapy, Solution-Oriented/Positive Psychology, and Ego-Supportive  Mental Status/Observations:  Appearance:   Casual     Behavior:  Appropriate  Motor:  Normal  Speech/Language:   Clear and Coherent  Affect:  Appropriate  Mood:  normal and somewhat stressed, manageable  Thought process:  normal  Thought content:    Paranoid Ideation, responsive  Sensory/Perceptual disturbances:    WNL  Orientation:  Fully oriented   Attention:  Good    Concentration:  Good  Memory:  WNL  Insight:    Good  Judgment:   Good  Impulse Control:  Good   Risk Assessment: Danger to Self: No Self-injurious Behavior: No Danger to Others: No Physical Aggression / Violence: No Duty to Warn: No Access to Firearms a concern: No  Assessment of progress:  stabilized  Diagnosis:   ICD-10-CM   1. Social anxiety disorder  F40.10     2. Adjustment disorder with mixed anxiety and depressed mood  F43.23     3. Work stress  Z56.6     4. Relationship problem between (former) partners  Z63.0     5. Relationship problem between parent and child  Z62.820     6. r/o early onset dysthymia  R69      Plan:  Physical/mental health -- Continue with diet/exercise program.  Notice if carb control helps headache and possible reactive hypoglycemia, possibly better anxiety management. Anxiety coping -- Continue use of breathing techniques, practice acknowledgment of feelings and wishes.  Make more use of rational reevaluation skills -- check the evidence for/against intrusive thoughts.  When noticing more sinister/paranoid ideas, check if she is more strung out on sleep or some other physiological need before drawing conclusions. Relationship issues -- When in a relationship, self-affirm sexual discretion, and practice awareness if she is expecting things she does not communicate adequately, or maintaining double standards. Medication -- OK to manage dosing, staying in touch with prescriber.  If more, it's not a failure, it's "rightsizing".  Endorse use of melatonin and L-theanine, and run by her physician if any concerns. Family boundaries -- Self-affirm boundary setting with mother, encourage more clarity about how/when to take up requests.  OK to be unwilling to [place] and overly responsible plans made for her.  Same with father, insofar as it comes up.  Always OK to state her own wishes for changing interactions, best practice to ask  listening first. School issues -- Use available supports re. colleague and job issues, communication/boundary ideas with needy or challenging students. Other recommendations/advice -- As may be noted above.  Continue to utilize previously learned skills ad lib. Medication compliance -- Maintain medication as prescribed and work faithfully with relevant prescriber(s) if any changes are desired or seem indicated. Crisis service -- Aware of call list and work-in appts.  Call the clinic on-call service, 988/hotline, 911, or present to [facility] or ER if any life-threatening psychiatric crisis. Followup -- Return for time at discretion.Marland Kitchen  Next scheduled visit with me Visit date not found.  Next scheduled in this office Visit date not found.  Robley Fries, PhD Marliss Czar, PhD LP Clinical Psychologist, [facility] [address] [place], [place] [address] [phone]

## 2022-11-16 NOTE — Progress Notes (Unsigned)
Office: [phone]  /  Fax: [phone]   TeleHealth Visit:  This visit was completed with telemedicine (audio/video) technology. Joyce Gray has verbally consented to this TeleHealth visit. The patient is located at home, the provider is located at home. The participants in this visit include the listed provider and patient. The visit was conducted today via MyChart video.  Initial Visit  Joyce Gray was seen via virtual visit today to evaluate for treatment of obesity. She is interested in losing weight to improve overall health and reduce the risk of weight related complications. She presents today to review program treatment options, initial physical assessment, and evaluation.     Height: 5\' 9"  Weight: 233 lbs BMI: 34  She was referred by: {emreferby:28303}  When asked what else they would like to accomplish? She states: {EMHopetoaccomplish:28304}  Weight history: ***  When asked how has your weight affected you? She states: {EMWeightAffected:28305}  Some associated conditions: {EMSomeConditions:28306}  Contributing factors: {EMcontributingfactors:28307}  Weight promoting medications identified: {EMWeightpromotingrx:28308}  Current nutrition plan: {EMNutritionplan:28309::"None"}  Current level of physical activity: {EMcurrentPA:28310::"None"}  Current or previous pharmacotherapy: {EM previousRx:28311}  Response to medication: {EMResponsetomedication:28312}  Past medical history includes:   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 Patient is in no acute distress.  Respiratory: Normal respiratory effort, no problems with respiration noted  Mental Status: Normal mood and affect. Normal behavior. Normal judgment and thought content.     Assessment and Plan:   1. Hyperlipidemia LDL is not at goal.  Lipid profile from 11/02/2021: HDL low at 46, LDL slightly elevated at 106, triglycerides elevated at 228. Medication(s): none  Lab Results  Component Value Date   CHOL 212 (H) 11/02/2021   HDL 46.70 11/02/2021   LDLCALC 106 (H) 08/31/2020   LDLDIRECT 144.0 11/02/2021   TRIG 228.0 (H) 11/02/2021   CHOLHDL 5 11/02/2021   CHOLHDL 4 08/31/2020   CHOLHDL 5 06/11/2019   Lab Results  Component Value Date   ALT 15 11/02/2021   AST 21 11/02/2021   ALKPHOS 73 11/02/2021   BILITOT 0.4 11/02/2021   The 10-year ASCVD risk score (Arnett DK, et al., 2019) is: 0.9%   Values used to calculate the score:     Age: 47 years     Sex: Female     Is Non-Hispanic African American: No     Diabetic: No     Tobacco smoker: No     Systolic Blood Pressure: 110 mmHg     Is BP treated: No     HDL Cholesterol: 46.7 mg/dL     Total Cholesterol: 212 mg/dL  Plan: Begin lifestyle intervention in conjunction with our program.  2. ***  3. Obesity Treatment / Action Plan:  Will complete provided nutritional and psychosocial assessment questionnaire before the next appointment. Will be scheduled for indirect calorimetry to determine resting energy expenditure in a fasting state.  This will allow [name] to create a reduced calorie, high-protein meal plan to promote loss of fat mass while preserving muscle mass. Was counseled on pharmacotherapy and role as an adjunct in weight management.   Obesity Education Performed Today:  We discussed obesity as a disease and the importance of a more detailed evaluation of all the  factors contributing to the disease.  We discussed the importance of long term lifestyle changes which include nutrition, exercise and behavioral modifications as well as the importance of customizing this to her specific health and social needs.  We discussed the benefits of reaching a healthier weight to alleviate the symptoms  of existing conditions and reduce the risks of the biomechanical, metabolic and psychological effects of obesity.  Joyce Gray appears to be in the action stage of change and states they are ready to start intensive lifestyle modifications and behavioral modifications.  ______________________________________________________________________________  She will be contacted by [facility] to set up initial appointment and the first follow up appointment with a physician.   The following office policies were discussed. She voiced understanding: - Patient will be considered late at 6 minutes past appointment time.   - For the first office visit, patient needs to arrive 1 hour early, fasting except for water. Patient should arrive 15 minutes early for all other visits. -  Patient will bring completed new patient paperwork to first office visit.  If not, appointment will be rescheduled.  30 minutes was spent today on this visit including the above counseling, pre-visit chart review, and post-visit documentation.  Reviewed by clinician on day of visit: allergies, medications, problem list, medical history, surgical history, family history, social history, and previous encounter notes pertinent to obesity diagnosis.    Jesse Sans, FNP

## 2022-11-17 ENCOUNTER — Telehealth (INDEPENDENT_AMBULATORY_CARE_PROVIDER_SITE_OTHER): Payer: BC Managed Care – PPO | Admitting: Family Medicine

## 2022-11-17 ENCOUNTER — Encounter (INDEPENDENT_AMBULATORY_CARE_PROVIDER_SITE_OTHER): Payer: Self-pay | Admitting: Family Medicine

## 2022-11-17 DIAGNOSIS — E669 Obesity, unspecified: Secondary | ICD-10-CM | POA: Diagnosis not present

## 2022-11-17 DIAGNOSIS — E7849 Other hyperlipidemia: Secondary | ICD-10-CM

## 2022-11-17 DIAGNOSIS — Z6834 Body mass index (BMI) 34.0-34.9, adult: Secondary | ICD-10-CM | POA: Diagnosis not present

## 2022-11-17 DIAGNOSIS — E785 Hyperlipidemia, unspecified: Secondary | ICD-10-CM | POA: Diagnosis not present

## 2022-11-28 ENCOUNTER — Encounter: Payer: Self-pay | Admitting: Family Medicine

## 2022-11-28 ENCOUNTER — Ambulatory Visit (INDEPENDENT_AMBULATORY_CARE_PROVIDER_SITE_OTHER): Payer: BC Managed Care – PPO | Admitting: Family Medicine

## 2022-11-28 VITALS — BP 117/80 | HR 54 | Temp 97.5°F | Ht 69.0 in | Wt 233.6 lb

## 2022-11-28 DIAGNOSIS — Z6834 Body mass index (BMI) 34.0-34.9, adult: Secondary | ICD-10-CM | POA: Diagnosis not present

## 2022-11-28 DIAGNOSIS — F329 Major depressive disorder, single episode, unspecified: Secondary | ICD-10-CM | POA: Diagnosis not present

## 2022-11-28 DIAGNOSIS — E559 Vitamin D deficiency, unspecified: Secondary | ICD-10-CM

## 2022-11-28 DIAGNOSIS — E6609 Other obesity due to excess calories: Secondary | ICD-10-CM

## 2022-11-28 DIAGNOSIS — E669 Obesity, unspecified: Secondary | ICD-10-CM | POA: Diagnosis not present

## 2022-11-28 DIAGNOSIS — E785 Hyperlipidemia, unspecified: Secondary | ICD-10-CM | POA: Diagnosis not present

## 2022-11-28 DIAGNOSIS — Z1211 Encounter for screening for malignant neoplasm of colon: Secondary | ICD-10-CM

## 2022-11-28 DIAGNOSIS — Z0000 Encounter for general adult medical examination without abnormal findings: Secondary | ICD-10-CM | POA: Diagnosis not present

## 2022-11-28 DIAGNOSIS — F419 Anxiety disorder, unspecified: Secondary | ICD-10-CM

## 2022-11-28 LAB — LDL CHOLESTEROL, DIRECT: Direct LDL: 139 mg/dL

## 2022-11-28 LAB — LIPID PANEL
Cholesterol: 239 mg/dL — ABNORMAL HIGH (ref 0–200)
HDL: 51.2 mg/dL (ref >39.00)
NonHDL: 188.13
Total CHOL/HDL Ratio: 5
Triglycerides: 323 mg/dL — ABNORMAL HIGH (ref 0.0–149.0)
VLDL: 64.6 mg/dL — ABNORMAL HIGH (ref 0.0–40.0)

## 2022-11-28 LAB — CBC
HCT: 37.9 % (ref 36.0–46.0)
Hemoglobin: 12.3 g/dL (ref 12.0–15.0)
MCHC: 32.4 g/dL (ref 30.0–36.0)
MCV: 91 fl (ref 78.0–100.0)
Platelets: 374 10*3/uL (ref 150.0–400.0)
RBC: 4.16 Mil/uL (ref 3.87–5.11)
RDW: 13.6 % (ref 11.5–15.5)
WBC: 9.8 10*3/uL (ref 4.0–10.5)

## 2022-11-28 LAB — COMPREHENSIVE METABOLIC PANEL
ALT: 12 U/L (ref 0–35)
AST: 16 U/L (ref 0–37)
Albumin: 3.9 g/dL (ref 3.5–5.2)
Alkaline Phosphatase: 54 U/L (ref 39–117)
BUN: 10 mg/dL (ref 6–23)
CO2: 26 mEq/L (ref 19–32)
Calcium: 9.4 mg/dL (ref 8.4–10.5)
Chloride: 103 mEq/L (ref 96–112)
Creatinine, Ser: 0.76 mg/dL (ref 0.40–1.20)
GFR: 94.12 mL/min (ref >60.00)
Glucose, Bld: 80 mg/dL (ref 70–99)
Potassium: 4.7 mEq/L (ref 3.5–5.1)
Sodium: 138 mEq/L (ref 135–145)
Total Bilirubin: 0.2 mg/dL (ref 0.2–1.2)
Total Protein: 7 g/dL (ref 6.0–8.3)

## 2022-11-28 LAB — IBC + FERRITIN
Ferritin: 15.9 ng/mL (ref 10.0–291.0)
Iron: 90 ug/dL (ref 42–145)
Saturation Ratios: 17.8 % — ABNORMAL LOW (ref 20.0–50.0)
TIBC: 505.4 ug/dL — ABNORMAL HIGH (ref 250.0–450.0)
Transferrin: 361 mg/dL — ABNORMAL HIGH (ref 212.0–360.0)

## 2022-11-28 LAB — VITAMIN D 25 HYDROXY (VIT D DEFICIENCY, FRACTURES): VITD: 48.34 ng/mL (ref 30.00–100.00)

## 2022-11-28 LAB — HEMOGLOBIN A1C: Hgb A1c MFr Bld: 5.2 % (ref 4.6–6.5)

## 2022-11-28 LAB — TSH: TSH: 1.83 u[IU]/mL (ref 0.35–5.50)

## 2022-11-28 NOTE — Assessment & Plan Note (Signed)
BMI 34.5 with comorbidities.  We discussed lifestyle modifications.  Will place referral to medical weight management.

## 2022-11-28 NOTE — Assessment & Plan Note (Signed)
Check vitamin D. 

## 2022-11-28 NOTE — Assessment & Plan Note (Signed)
Stable on Lexapro 5 mg daily.  Follows with psychiatry. 

## 2022-11-28 NOTE — Assessment & Plan Note (Signed)
Stable on Lexapro 5 mg daily.  Follows with psychiatry.

## 2022-11-28 NOTE — Patient Instructions (Signed)
It was very nice to see you today!  We will check blood work today.  I will refer you to the weight management clinic with Dr. Earlene Plater.  We will refer you for your colonoscopy.  Please continue to work on diet and exercise.  Return in about 1 year (around 11/28/2023) for Annual Physical.   Take care, Dr [name]  PLEASE NOTE:  If you had any lab tests, please let us know if you have not heard back within a few days. You may see your results on mychart before we have a chance to review them but we will give you a call once they are reviewed by [name].   If we ordered any referrals today, please let us know if you have not heard from their office within the next week.   If you had any urgent prescriptions sent in today, please check with the pharmacy within an hour of our visit to make sure the prescription was transmitted appropriately.   Please try these tips to maintain a healthy lifestyle:  Eat at least 3 REAL meals and 1-2 snacks per day.  Aim for no more than 5 hours between eating.  If you eat breakfast, please do so within one hour of getting up.   Each meal should contain half fruits/vegetables, one quarter protein, and one quarter carbs (no bigger than a computer mouse)  Cut down on sweet beverages. This includes juice, soda, and sweet tea.   Drink at least 1 glass of water with each meal and aim for at least 8 glasses per day  Exercise at least 150 minutes every week.    Preventive Care 29-32 Years Old, Female Preventive care refers to lifestyle choices and visits with your health care provider that can promote health and wellness. Preventive care visits are also called wellness exams. What can I expect for my preventive care visit? Counseling Your health care provider may ask you questions about your: Medical history, including: Past medical problems. Family medical history. Pregnancy history. Current health, including: Menstrual cycle. Method of birth  control. Emotional well-being. Home life and relationship well-being. Sexual activity and sexual health. Lifestyle, including: Alcohol, nicotine or tobacco, and drug use. Access to firearms. Diet, exercise, and sleep habits. Work and work Astronomer. Sunscreen use. Safety issues such as seatbelt and bike helmet use. Physical exam Your health care provider will check your: Height and weight. These may be used to calculate your BMI (body mass index). BMI is a measurement that tells if you are at a healthy weight. Waist circumference. This measures the distance around your waistline. This measurement also tells if you are at a healthy weight and may help predict your risk of certain diseases, such as type 2 diabetes and high blood pressure. Heart rate and blood pressure. Body temperature. Skin for abnormal spots. What immunizations do I need?  Vaccines are usually given at various ages, according to a schedule. Your health care provider will recommend vaccines for you based on your age, medical history, and lifestyle or other factors, such as travel or where you work. What tests do I need? Screening Your health care provider may recommend screening tests for certain conditions. This may include: Lipid and cholesterol levels. Diabetes screening. This is done by checking your blood sugar (glucose) after you have not eaten for a while (fasting). Pelvic exam and Pap test. Hepatitis B test. Hepatitis C test. HIV (human immunodeficiency virus) test. STI (sexually transmitted infection) testing, if you are at risk. Lung cancer  screening. Colorectal cancer screening. Mammogram. Talk with your health care provider about when you should start having regular mammograms. This may depend on whether you have a family history of breast cancer. BRCA-related cancer screening. This may be done if you have a family history of breast, ovarian, tubal, or peritoneal cancers. Bone density scan. This is  done to screen for osteoporosis. Talk with your health care provider about your test results, treatment options, and if necessary, the need for more tests. Follow these instructions at home: Eating and drinking  Eat a diet that includes fresh fruits and vegetables, whole grains, lean protein, and low-fat dairy products. Take vitamin and mineral supplements as recommended by your health care provider. Do not drink alcohol if: Your health care provider tells you not to drink. You are pregnant, may be pregnant, or are planning to become pregnant. If you drink alcohol: Limit how much you have to 0-1 drink a day. Know how much alcohol is in your drink. In the [place] one drink equals one 12 oz bottle of beer (355 mL), one 5 oz glass of wine (148 mL), or one 1 oz glass of hard liquor (44 mL). Lifestyle Brush your teeth every morning and night with fluoride toothpaste. Floss one time each day. Exercise for at least 30 minutes 5 or more days each week. Do not use any products that contain nicotine or tobacco. These products include cigarettes, chewing tobacco, and vaping devices, such as e-cigarettes. If you need help quitting, ask your health care provider. Do not use drugs. If you are sexually active, practice safe sex. Use a condom or other form of protection to prevent STIs. If you do not wish to become pregnant, use a form of birth control. If you plan to become pregnant, see your health care provider for a prepregnancy visit. Take aspirin only as told by your health care provider. Make sure that you understand how much to take and what form to take. Work with your health care provider to find out whether it is safe and beneficial for you to take aspirin daily. Find healthy ways to manage stress, such as: Meditation, yoga, or listening to music. Journaling. Talking to a trusted person. Spending time with friends and family. Minimize exposure to UV radiation to reduce your risk of skin  cancer. Safety Always wear your seat belt while driving or riding in a vehicle. Do not drive: If you have been drinking alcohol. Do not ride with someone who has been drinking. When you are tired or distracted. While texting. If you have been using any mind-altering substances or drugs. Wear a helmet and other protective equipment during sports activities. If you have firearms in your house, make sure you follow all gun safety procedures. Seek help if you have been physically or sexually abused. What's next? Visit your health care provider once a year for an annual wellness visit. Ask your health care provider how often you should have your eyes and teeth checked. Stay up to date on all vaccines. This information is not intended to replace advice given to you by your health care provider. Make sure you discuss any questions you have with your health care provider. Document Revised: 11/04/2020 Document Reviewed: 11/04/2020 Elsevier Patient Education  2024 [employer].

## 2022-11-28 NOTE — Progress Notes (Signed)
Chief Complaint:  Joyce Gray is a 47 y.o. female who presents today for her annual comprehensive physical exam.    Assessment/Plan:  Chronic Problems Addressed Today: Dyslipidemia Discussed lifestyle modifications.  She is interested in seeing weight management to help with this.  Will place referral today.  Will check labs.  Depression Stable on Lexapro 5 mg daily.  Follows with psychiatry.  Anxiety Stable on Lexapro 5 mg daily.  Follows with psychiatry.  Vitamin D deficiency Check vitamin D.   Obesity BMI 34.5 with comorbidities.  We discussed lifestyle modifications.  Will place referral to medical weight management.  Preventative Healthcare: Follows with gynecology for women's health.  Up-to-date on Pap smear to date on vaccines.  Will check labs today.  Also order Cologuard.  Patient Counseling(The following topics were reviewed and/or handout was given):  -Nutrition: Stressed importance of moderation in sodium/caffeine intake, saturated fat and cholesterol, caloric balance, sufficient intake of fresh fruits, vegetables, and fiber.  -Stressed the importance of regular exercise.   -Substance Abuse: Discussed cessation/primary prevention of tobacco, alcohol, or other drug use; driving or other dangerous activities under the influence; availability of treatment for abuse.   -Injury prevention: Discussed safety belts, safety helmets, smoke detector, smoking near bedding or upholstery.   -Sexuality: Discussed sexually transmitted diseases, partner selection, use of condoms, avoidance of unintended pregnancy and contraceptive alternatives.   -Dental health: Discussed importance of regular tooth brushing, flossing, and dental visits.  -Health maintenance and immunizations reviewed. Please refer to Health maintenance section.  Return to care in 1 year for next preventative visit.     Subjective:  HPI:  She has no acute complaints today. See Assessment / plan for status of  chronic conditions.   Lifestyle Diet: None specific Exercise: None specific.      11/28/2022    8:32 AM  Depression screen PHQ 2/9  Decreased Interest 1  Down, Depressed, Hopeless 1  PHQ - 2 Score 2  Altered sleeping 1  Tired, decreased energy 1  Change in appetite 0  Feeling bad or failure about yourself  1  Trouble concentrating 0  Moving slowly or fidgety/restless 0  Suicidal thoughts 0  PHQ-9 Score 5  Difficult doing work/chores Somewhat difficult    Health Maintenance Due  Topic Date Due   Colonoscopy  Never done    ROS: Per HPI, otherwise a complete review of systems was negative.   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     ferrous sulfate 324 MG TBEC Take 324 mg by mouth.     Multiple Vitamin (MULTI-VITAMIN DAILY PO) SMARTSIG:1 By  Mouth     Omega-3 Fatty Acids (FISH OIL) 300 MG CAPS Take by mouth.     TRI-ESTARYLLA 0.18/0.215/0.25 MG-35 MCG tablet Take 1 tablet by mouth daily.     Vitamin D, Ergocalciferol, (DRISDOL) 1.25 MG (50000 UNIT) CAPS capsule Take 50,000 Units by mouth every 7 (seven) days.     No current facility-administered medications for this visit.    Allergies-reviewed and updated No Known Allergies  Social History   Socioeconomic History   Marital status: Single    Spouse name: Not on file   Number of children: 0   Years of education: 16   Highest education level: Not on file  Occupational History   Occupation: Magazine features editor: [place]  Tobacco Use   Smoking status: Never    Passive exposure: Past   Smokeless tobacco: Never  Vaping Use   Vaping Use: Never used  Substance and Sexual Activity   Alcohol use: Not Currently    Comment: Rare   Drug use: No   Sexual activity: Not Currently    Partners: Male  Other Topics Concern   Not on file  Social History Narrative   Single. Barrister's clerk. Fun/Hobby: Hiking, reading, coffee with friends, dancing.    Social Determinants of Health   Financial Resource Strain: Not on file  Food Insecurity: Not on file  Transportation Needs: Not on file  Physical Activity: Not on file  Stress: Not on file  Social Connections: Not on file        Objective:  Physical Exam: BP 117/80   Pulse (!) 54   Temp (!) 97.5 F (36.4 C) (Temporal)   Ht 5\' 9"  (1.753 m)   Wt 233 lb 9.6 oz (106 kg)   LMP 11/05/2022   SpO2 97%   BMI 34.50 kg/m   Body mass index is 34.5 kg/m. Wt Readings from Last 3 Encounters:  11/28/22 233 lb 9.6 oz (106 kg)  09/01/22 233 lb 3.2 oz (105.8 kg)  07/20/22 228 lb 6 oz (103.6 kg)   Gen: NAD, resting comfortably HEENT: TMs normal bilaterally. OP clear. No thyromegaly noted.  CV: RRR with no murmurs appreciated Pulm: NWOB, CTAB with no crackles, wheezes, or rhonchi GI: Normal bowel sounds present. Soft,  Nontender, Nondistended. MSK: no edema, cyanosis, or clubbing noted Skin: warm, dry Neuro: CN2-12 grossly intact. Strength 5/5 in upper and lower extremities. Reflexes symmetric and intact bilaterally.  Psych: Normal affect and thought content     Lamarcus Spira M. [name], MD 11/28/2022 9:15 AM

## 2022-11-28 NOTE — Assessment & Plan Note (Signed)
Discussed lifestyle modifications.  She is interested in seeing weight management to help with this.  Will place referral today.  Will check labs.

## 2022-11-29 ENCOUNTER — Encounter: Payer: Self-pay | Admitting: Internal Medicine

## 2022-11-30 NOTE — Progress Notes (Signed)
Her cholesterol level is about the same as last year.  Do not need to start meds but she should continue to work on diet and exercise as we discussed at her office visit.  Her iron levels have dropped a bit since last time that we checked.  Can we please verify that she is taking an iron supplement?  Recommend ferrous sulfate 65 mg every other day on an empty stomach with vitamin C if she is not taking supplement already.  We should recheck this in 3 to 6 months.  The rest of her labs are all stable and we can recheck in a year.

## 2022-12-05 ENCOUNTER — Other Ambulatory Visit: Payer: Self-pay | Admitting: *Deleted

## 2022-12-05 DIAGNOSIS — E611 Iron deficiency: Secondary | ICD-10-CM

## 2022-12-05 NOTE — Progress Notes (Signed)
Noted. She can continue management per GYN then.  Joyce Gray. [name], MD 12/05/2022 8:42 AM

## 2022-12-12 ENCOUNTER — Ambulatory Visit (AMBULATORY_SURGERY_CENTER): Payer: BC Managed Care – PPO

## 2022-12-12 ENCOUNTER — Encounter: Payer: Self-pay | Admitting: Internal Medicine

## 2022-12-12 ENCOUNTER — Other Ambulatory Visit: Payer: Self-pay

## 2022-12-12 VITALS — Ht 69.0 in | Wt 229.0 lb

## 2022-12-12 DIAGNOSIS — Z1211 Encounter for screening for malignant neoplasm of colon: Secondary | ICD-10-CM

## 2022-12-12 MED ORDER — NA SULFATE-K SULFATE-MG SULF 17.5-3.13-1.6 GM/177ML PO SOLN
1.0000 | Freq: Once | ORAL | 0 refills | Status: AC
Start: 2022-12-12 — End: 2022-12-12

## 2022-12-12 NOTE — Progress Notes (Signed)
Denies allergies to eggs or soy products. Denies complication of anesthesia or sedation. Denies use of weight loss medication. Denies use of O2.   Emmi instructions given for colonoscopy.  

## 2022-12-21 ENCOUNTER — Telehealth: Payer: Self-pay | Admitting: Obstetrics and Gynecology

## 2022-12-21 ENCOUNTER — Encounter (HOSPITAL_COMMUNITY): Payer: Self-pay

## 2022-12-21 ENCOUNTER — Emergency Department (HOSPITAL_COMMUNITY): Payer: BC Managed Care – PPO

## 2022-12-21 ENCOUNTER — Emergency Department (HOSPITAL_COMMUNITY)
Admission: EM | Admit: 2022-12-21 | Discharge: 2022-12-22 | Disposition: A | Discharge status: Home / Self Care | Payer: BC Managed Care – PPO | Attending: Emergency Medicine | Admitting: Emergency Medicine

## 2022-12-21 ENCOUNTER — Other Ambulatory Visit: Payer: Self-pay

## 2022-12-21 DIAGNOSIS — R109 Unspecified abdominal pain: Secondary | ICD-10-CM | POA: Insufficient documentation

## 2022-12-21 DIAGNOSIS — N939 Abnormal uterine and vaginal bleeding, unspecified: Secondary | ICD-10-CM | POA: Diagnosis present

## 2022-12-21 DIAGNOSIS — D649 Anemia, unspecified: Secondary | ICD-10-CM | POA: Insufficient documentation

## 2022-12-21 LAB — CBC
HCT: 35.7 % — ABNORMAL LOW (ref 36.0–46.0)
Hemoglobin: 11.8 g/dL — ABNORMAL LOW (ref 12.0–15.0)
MCH: 30.3 pg (ref 26.0–34.0)
MCHC: 33.1 g/dL (ref 30.0–36.0)
MCV: 91.5 fL (ref 80.0–100.0)
Platelets: 370 10*3/uL (ref 150–400)
RBC: 3.9 MIL/uL (ref 3.87–5.11)
RDW: 13.1 % (ref 11.5–15.5)
WBC: 15.6 10*3/uL — ABNORMAL HIGH (ref 4.0–10.5)
nRBC: 0 % (ref 0.0–0.2)

## 2022-12-21 LAB — HCG, SERUM, QUALITATIVE: Preg, Serum: NEGATIVE

## 2022-12-21 NOTE — Telephone Encounter (Signed)
Returned call to ED physician Dr. Manus Gunning    OB/GYN Telephone Consult  12/21/2022   Joyce Gray is a 47 y.o. [id number] who is currently not pregnant presenting to [facility].   I was called for a consult regarding the care of this patient by the Physician (MD/DO) caring for the patient.   The provider had the following clinical question: Recommendation for acute on chronic AUB and dysmenorrhea management  The provider presented the following relevant clinical information: 47 yo patient with AUB x 6 months with more recent increased in bleeding along with passing blood clots and having cramping. Has previously been on mini pill and provera without much success previously and had been in discussion regarding possible hysterectomy. Hemodynamically stable.   I performed a chart review on the patient and reviewed available documentation.     12/21/2022   11:24 PM 12/21/2022    5:44 PM 12/12/2022   12:29 PM  Vitals with BMI  Height  5\' 9"  5\' 9"   Weight  229 lbs 1 oz 229 lbs  BMI  33.81 33.8  Systolic 142    Diastolic 82    Pulse 70       Exam- performed by consulting provider   Recommendations:  -megace 40mg  BID -NSAIDs -outpatient follow up -Recommended MD/APP have patient follow up with primary GYN outpatient   Thank you for this consult and if additional recommendations are needed please contact [facility] on call gynecology provider   I spent approximately 5 minutes directly consulting with the provider and verbally discussing this case. Additionally 5 minutes minutes was spent performing chart review and documentation.    Lorriane Shire, MD

## 2022-12-21 NOTE — ED Triage Notes (Signed)
Pt c/o vaginal bleeding and passing big clotsx40mos. Pt states she has tried different forms of birth control to try to control it, but it's not working. Pt states she's soaking more than one pad an hour. Pt denies weakness or dizziness

## 2022-12-21 NOTE — ED Provider Notes (Signed)
[place] EMERGENCY DEPARTMENT AT [facility] Provider Note   CSN: [id number] Arrival date & time: 12/21/22  1737     History {Add pertinent medical, surgical, social history, OB history to HPI:1} Chief Complaint  Patient presents with   Vaginal Bleeding    Joyce Gray is a 47 y.o. female.  Patient here with 6 months of irregular vaginal bleeding with history of fibroids.  States she has been seen by various gynecologist and treated with various therapies including minipill birth control, Provera and other birth control options.  She states she has a history of fibroids.  She was offered hysterectomy by her gynecologist but is not certain she wants to pursue this.  Recently stopped taking her iron.  Comes in tonight because she had increasing cramps with several clots.  Became concerned because she never had clots the size before and she had severe cramps onset today which have since improved.  Denies any blood thinner use.  Denies any weakness, dizziness, chest pain, shortness of breath, nausea, vomiting, fever.  States she is having no pain currently.   states she is soaking about 1 pad per hour.  Denies feeling dizzy or lightheaded.  The history is provided by the patient.  Vaginal Bleeding Associated symptoms: abdominal pain   Associated symptoms: no dizziness, no dysuria, no fever and no nausea        Home Medications Prior to Admission medications   Medication Sig Start Date End Date Taking? Authorizing Provider  escitalopram (LEXAPRO) 5 MG tablet Take 5 mg by mouth daily.    [provider]  ferrous sulfate 324 MG TBEC Take 324 mg by mouth. Patient not taking: Reported on 12/12/2022    [provider]  Multiple Vitamin (MULTI-VITAMIN DAILY PO) SMARTSIG:1 By Mouth 05/18/21   [provider]  norgestimate-ethinyl estradiol (ORTHO-CYCLEN) 0.25-35 MG-MCG tablet Take 1 tablet by mouth daily.    [provider]  Omega-3 Fatty Acids  (FISH OIL) 300 MG CAPS Take by mouth.    [provider]  OVER THE COUNTER MEDICATION Heme Plus, 20 mg, three tablets daily. 50 mcg folate. 75 mcg B12.    [provider]  Vitamin D, Ergocalciferol, (DRISDOL) 1.25 MG (50000 UNIT) CAPS capsule Take 50,000 Units by mouth every 7 (seven) days.    [provider]      Allergies    Patient has no known allergies.    Review of Systems   Review of Systems  Constitutional:  Negative for activity change, appetite change and fever.  HENT:  Negative for congestion and rhinorrhea.   Respiratory:  Negative for cough, chest tightness and shortness of breath.   Cardiovascular:  Negative for chest pain.  Gastrointestinal:  Positive for abdominal pain. Negative for nausea and vomiting.  Genitourinary:  Positive for vaginal bleeding. Negative for dysuria.  Skin:  Negative for rash.  Neurological:  Negative for dizziness, weakness and headaches.   all other systems are negative except as noted in the HPI and PMH.    Physical Exam Updated Vital Signs Ht 5\' 9"  (1.753 m)   Wt 103.9 kg   LMP 11/05/2022   BMI 33.83 kg/m  Physical Exam Vitals and nursing note reviewed.  Constitutional:      General: She is not in acute distress.    Appearance: She is well-developed.  HENT:     Head: Normocephalic and atraumatic.     Mouth/Throat:     Pharynx: No oropharyngeal exudate.  Eyes:  Conjunctiva/sclera: Conjunctivae normal.     Pupils: Pupils are equal, round, and reactive to light.  Neck:     Comments: No meningismus. Cardiovascular:     Rate and Rhythm: Normal rate and regular rhythm.     Heart sounds: Normal heart sounds. No murmur heard. Pulmonary:     Effort: Pulmonary effort is normal. No respiratory distress.     Breath sounds: Normal breath sounds.  Abdominal:     Palpations: Abdomen is soft.     Tenderness: There is no abdominal tenderness. There is no guarding or rebound.  Musculoskeletal:        General: No  tenderness. Normal range of motion.     Cervical back: Normal range of motion and neck supple.  Skin:    General: Skin is warm.  Neurological:     Mental Status: She is alert and oriented to person, place, and time.     Cranial Nerves: No cranial nerve deficit.     Motor: No abnormal muscle tone.     Coordination: Coordination normal.     Comments:  5/5 strength throughout. CN 2-12 intact.Equal grip strength.   Psychiatric:        Behavior: Behavior normal.     ED Results / Procedures / Treatments   Labs (all labs ordered are listed, but only abnormal results are displayed) Labs Reviewed  CBC - Abnormal; Notable for the following components:      Result Value   WBC 15.6 (*)    Hemoglobin 11.8 (*)    HCT 35.7 (*)    All other components within normal limits  HCG, SERUM, QUALITATIVE  HEMOGLOBIN AND HEMATOCRIT, BLOOD  PROTIME-INR    EKG None  Radiology No results found.  Procedures Procedures  {Document cardiac monitor, telemetry assessment procedure when appropriate:1}  Medications Ordered in ED Medications - No data to display  ED Course/ Medical Decision Making/ A&P   {   Click here for ABCD2, HEART and other calculatorsREFRESH Note before signing :1}                              Medical Decision Making Amount and/or Complexity of Data Reviewed Labs: ordered. Decision-making details documented in ED Course. Radiology: ordered and independent interpretation performed. Decision-making details documented in ED Course. ECG/medicine tests: ordered and independent interpretation performed. Decision-making details documented in ED Course.   Acute on chronic irregular vaginal bleeding for several months.  Vitals are stable. Abdomen soft without peritoneal signs.  Hemoglobin 11.8 which is stable compared to previous values. hCG is negative  {Document critical care time when appropriate:1} {Document review of labs and clinical decision tools ie heart score,  Chads2Vasc2 etc:1}  {Document your independent review of radiology images, and any outside records:1} {Document your discussion with family members, caretakers, and with consultants:1} {Document social determinants of health affecting pt's care:1} {Document your decision making why or why not admission, treatments were needed:1} Final Clinical Impression(s) / ED Diagnoses Final diagnoses:  None    Rx / DC Orders ED Discharge Orders     None

## 2022-12-22 MED ORDER — MEGESTROL ACETATE 40 MG PO TABS: 40.0000 mg | ORAL_TABLET | Freq: Every day | ORAL | 0 refills | Status: DC

## 2022-12-22 MED ORDER — MEGESTROL ACETATE 40 MG PO TABS
40.0000 mg | ORAL_TABLET | Freq: Once | ORAL | Status: AC
  Administered 2022-12-22: 40 mg via ORAL
  Filled 2022-12-22: qty 1

## 2022-12-22 NOTE — Discharge Instructions (Signed)
Your hemoglobin today is stable.  Take the Megace as prescribed and follow-up with your gynecologist for further evaluation of your vaginal bleeding and thickening of your endometrium.  Return to the ED with worsening bleeding, dizziness, lightheadedness, chest pain, shortness of breath or other concerns.

## 2022-12-22 NOTE — ED Notes (Signed)
Pt provided with AVS.  Education complete; all questions answered.  Pt leaving ED in stable condition at this time, ambulatory with all belongings. 

## 2023-01-03 ENCOUNTER — Encounter: Payer: BC Managed Care – PPO | Admitting: Internal Medicine

## 2023-01-03 ENCOUNTER — Other Ambulatory Visit: Payer: Self-pay | Admitting: Obstetrics & Gynecology

## 2023-01-04 ENCOUNTER — Other Ambulatory Visit: Payer: Self-pay | Admitting: Obstetrics & Gynecology

## 2023-01-16 HISTORY — PX: OTHER SURGICAL HISTORY: SHX169

## 2023-02-06 ENCOUNTER — Encounter (HOSPITAL_BASED_OUTPATIENT_CLINIC_OR_DEPARTMENT_OTHER): Payer: Self-pay | Admitting: Obstetrics & Gynecology

## 2023-02-06 ENCOUNTER — Other Ambulatory Visit (INDEPENDENT_AMBULATORY_CARE_PROVIDER_SITE_OTHER): Payer: BC Managed Care – PPO

## 2023-02-06 ENCOUNTER — Other Ambulatory Visit: Payer: Self-pay

## 2023-02-06 DIAGNOSIS — E611 Iron deficiency: Secondary | ICD-10-CM | POA: Diagnosis not present

## 2023-02-06 NOTE — Progress Notes (Signed)
Spoke w/ via phone for pre-op interview---pt Lab needs dos----  cbc, t & s, urine preg       Lab results------none COVID test -----patient states asymptomatic no test needed Arrive at -------800 02-10-2023 NPO after MN NO Solid Food.  Clear liquids from MN until---700 Med rec completed Medications to take morning of surgery -----lexapro, megase Diabetic medication -----n/a Patient instructed no nail polish to be worn day of surgery Patient instructed to bring photo id and insurance card day of surgery Patient aware to have Driver (ride ) / caregiver   mother or father  for 24 hours after surgery -  Patient Special Instructions -----none Pre-Op special Instructions -----none Patient verbalized understanding of instructions that were given at this phone interview. Patient denies chest pain, sob, fever, cough at the interview.

## 2023-02-07 LAB — IBC + FERRITIN
Ferritin: 20.9 ng/mL (ref 10.0–291.0)
Iron: 74 ug/dL (ref 42–145)
Saturation Ratios: 19.5 % — ABNORMAL LOW (ref 20.0–50.0)
TIBC: 379.4 ug/dL (ref 250.0–450.0)
Transferrin: 271 mg/dL (ref 212.0–360.0)

## 2023-02-07 NOTE — Progress Notes (Signed)
Iron levels about the same as last time though slightly better overall.  She should continue with iron supplementation and we can recheck at next office visit.

## 2023-02-09 ENCOUNTER — Encounter: Payer: BC Managed Care – PPO | Admitting: Internal Medicine

## 2023-02-09 NOTE — H&P (Signed)
Joyce Gray is an 47 y.o. female with a submucosal fibroid and prolonged periods here for a Dilation and Curettage Hysteroscopy with myosure.  She is s/p partial resection of submucosal fibroid by office hysteroscopy and is now here for removal of remaining submucosal fibroid.   Pertinent Gynecological History: Menses:  prolonged, heavy.  Bleeding: none currently Contraception: none DES exposure: unknown Blood transfusions: none Sexually transmitted diseases: no past history Previous GYN Procedures:  As above   Last mammogram: normal Date: 12/04/20. Last pap: normal Date: 05/2022 OB History: G0, P0   Menstrual History: No LMP recorded.    Past Medical History:  Diagnosis Date   Abnormal Pap smear of cervix 1999, 2016   Allergy    Anemia    Anxiety    Arthritis    Complication of anesthesia    felt like heart rate was a little low after d and c 01-16-2023 ( done in office)   Depression    Hyperlipidemia    Incomplete right bundle branch block    dx in 2009  stress test 2009 normal no cardiologist, repeat ekg 2013 was nsr   Lyme disease 2020   [facility] spotted fever 02/2019   Wears glasses or contacts     Past Surgical History:  Procedure Laterality Date   CRYOTHERAPY  1999   for abnormal pap smear   d & c hysteroscopy myomectomy  01/16/2023   done in office   TYMPANOSTOMY TUBE PLACEMENT     1980 and 1982   WISDOM TOOTH EXTRACTION Bilateral 1996   wisdome teeth extraction  1995    Family History  Problem Relation Age of Onset   Hyperlipidemia Mother    Alcohol abuse Mother    Melanoma Father    Bipolar disorder Brother    Breast cancer Maternal Aunt 65   Melanoma Paternal Aunt 45   Heart disease Maternal Grandfather    Colon cancer Neg Hx    Esophageal cancer Neg Hx    Rectal cancer Neg Hx    Stomach cancer Neg Hx     Social History:  reports that she has never smoked. She has been exposed to tobacco smoke. She has never used smokeless tobacco. She  reports current alcohol use. She reports that she does not use drugs.  Allergies: No Known Allergies  Current Outpatient Medications  Medication Instructions   escitalopram (LEXAPRO) 5 mg, Oral, Daily   megestrol (MEGACE) 40 mg, Oral, Daily   Multiple Vitamin (MULTI-VITAMIN DAILY PO) SMARTSIG:1 By Mouth   Omega-3 Fatty Acids (FISH OIL) 300 MG CAPS Oral, 2 pills per day   OVER THE COUNTER MEDICATION Heme Plus, 20 mg, two  tablets daily. 50 mcg folate. 75 mcg B12.    VITAMIN D PO Oral, Vitamin d 5000 units 5 days per week, vitamin d 56213 units 2 days per week    No Known Allergies    Review of Systems ROS  Constitutional: Denies fevers/chills Cardiovascular: Denies chest pain or palpitations Pulmonary: Denies coughing or wheezing Gastrointestinal: Denies nausea, vomiting or diarrhea Genitourinary: Denies pelvic pain, unusual vaginal discharge, dysuria, urgency or frequency. With unusual vaginal bleeding Musculoskeletal: Denies muscle or joint aches and pain.  Neurology: Denies abnormal sensations such as tingling or numbness.   Height 5\' 9"  (1.753 m), weight 103 kg.    02/10/2023    8:01 AM 02/06/2023    1:24 PM 12/22/2022    1:30 AM  Vitals with BMI  Height 5\' 9"  5\' 9"    Weight  224 lbs 10 oz 227 lbs   BMI 33.15 33.51   Systolic 138  129  Diastolic 76  83  Pulse 52  58    Physical Exam Constitutional: She is oriented to person, place, and time. She appears well-developed and well-nourished.  HENT:  Head: Normocephalic and atraumatic.  Neck: Normal range of motion.  Cardiovascular: Normal rate, regular rhythm and normal heart sounds.   Respiratory: Effort normal and breath sounds normal.  GI: Soft. Bowel sounds are normal.  Neurological: She is alert and oriented to person, place, and time.  Skin: Skin is warm and dry.  Psychiatric: She has a normal mood and affect. Her behavior is normal.   Recent Results (from the past 2160 hour(s))  CBC     Status: None   Collection  Time: 11/28/22  9:08 AM  Result Value Ref Range   WBC 9.8 4.0 - 10.5 K/uL   RBC 4.16 3.87 - 5.11 Mil/uL   Platelets 374.0 150.0 - 400.0 K/uL   Hemoglobin 12.3 12.0 - 15.0 g/dL   HCT 16.1 09.6 - 04.5 %   MCV 91.0 78.0 - 100.0 fl   MCHC 32.4 30.0 - 36.0 g/dL   RDW 40.9 81.1 - 91.4 %  Comprehensive metabolic panel     Status: None   Collection Time: 11/28/22  9:08 AM  Result Value Ref Range   Sodium 138 135 - 145 mEq/L   Potassium 4.7 3.5 - 5.1 mEq/L   Chloride 103 96 - 112 mEq/L   CO2 26 19 - 32 mEq/L   Glucose, Bld 80 70 - 99 mg/dL   BUN 10 6 - 23 mg/dL   Creatinine, Ser 7.82 0.40 - 1.20 mg/dL   Total Bilirubin 0.2 0.2 - 1.2 mg/dL   Alkaline Phosphatase 54 39 - 117 U/L   AST 16 0 - 37 U/L   ALT 12 0 - 35 U/L   Total Protein 7.0 6.0 - 8.3 g/dL   Albumin 3.9 3.5 - 5.2 g/dL   GFR 95.62 >13.08 mL/min    Comment: Calculated using the CKD-EPI Creatinine Equation (2021)   Calcium 9.4 8.4 - 10.5 mg/dL  Hemoglobin M5H     Status: None   Collection Time: 11/28/22  9:08 AM  Result Value Ref Range   Hgb A1c MFr Bld 5.2 4.6 - 6.5 %    Comment: Glycemic Control Guidelines for People with Diabetes:Non Diabetic:  <6%Goal of Therapy: <7%Additional Action Suggested:  >8%   TSH     Status: None   Collection Time: 11/28/22  9:08 AM  Result Value Ref Range   TSH 1.83 0.35 - 5.50 uIU/mL  Lipid panel     Status: Abnormal   Collection Time: 11/28/22  9:08 AM  Result Value Ref Range   Cholesterol 239 (H) 0 - 200 mg/dL    Comment: ATP III Classification       Desirable:  < 200 mg/dL               Borderline High:  200 - 239 mg/dL          High:  > = 846 mg/dL   Triglycerides 962.9 (H) 0.0 - 149.0 mg/dL    Comment: Normal:  <528 mg/dLBorderline High:  150 - 199 mg/dL   HDL 41.32 >44.01 mg/dL   VLDL 02.7 (H) 0.0 - 25.3 mg/dL   Total CHOL/HDL Ratio 5     Comment:  Men          Women1/2 Average Risk     3.4          3.3Average Risk          5.0          4.42X Average Risk          9.6           7.13X Average Risk          15.0          11.0                       NonHDL 188.13     Comment: NOTE:  Non-HDL goal should be 30 mg/dL higher than patient's LDL goal (i.e. LDL goal of < 70 mg/dL, would have non-HDL goal of < 100 mg/dL)  VITAMIN D 25 Hydroxy (Vit-D Deficiency, Fractures)     Status: None   Collection Time: 11/28/22  9:08 AM  Result Value Ref Range   VITD 48.34 30.00 - 100.00 ng/mL  IBC + Ferritin     Status: Abnormal   Collection Time: 11/28/22  9:08 AM  Result Value Ref Range   Iron 90 42 - 145 ug/dL   Transferrin 469.6 (H) 212.0 - 360.0 mg/dL   Saturation Ratios 29.5 (L) 20.0 - 50.0 %   Ferritin 15.9 10.0 - 291.0 ng/mL   TIBC 505.4 (H) 250.0 - 450.0 mcg/dL  LDL cholesterol, direct     Status: None   Collection Time: 11/28/22  9:08 AM  Result Value Ref Range   Direct LDL 139.0 mg/dL    Comment: Optimal:  <284 mg/dLNear or Above Optimal:  100-129 mg/dLBorderline High:  130-159 mg/dLHigh:  160-189 mg/dLVery High:  >190 mg/dL  hCG, serum, qualitative     Status: None   Collection Time: 12/21/22  5:53 PM  Result Value Ref Range   Preg, Serum NEGATIVE NEGATIVE    Comment:        THE SENSITIVITY OF THIS METHODOLOGY IS >10 mIU/mL. Performed at [facility], [address] [place], [place] [address]   CBC     Status: Abnormal   Collection Time: 12/21/22  5:53 PM  Result Value Ref Range   WBC 15.6 (H) 4.0 - 10.5 K/uL   RBC 3.90 3.87 - 5.11 MIL/uL   Hemoglobin 11.8 (L) 12.0 - 15.0 g/dL   HCT 01.0 (L) 27.2 - 53.6 %   MCV 91.5 80.0 - 100.0 fL   MCH 30.3 26.0 - 34.0 pg   MCHC 33.1 30.0 - 36.0 g/dL   RDW 64.4 03.4 - 74.2 %   Platelets 370 150 - 400 K/uL   nRBC 0.0 0.0 - 0.2 %    Comment: Performed at [facility], [address] [place], [place] [address]  Hemoglobin and hematocrit, blood     Status: Abnormal   Collection Time: 12/21/22 11:36 PM  Result Value Ref Range   Hemoglobin 11.3 (L) 12.0 - 15.0 g/dL   HCT 87.5 (L) 64.3 - 32.9 %     Comment: Performed at [facility], [address], [place], [place] [address]  Protime-INR     Status: None   Collection Time: 12/21/22 11:36 PM  Result Value Ref Range   Prothrombin Time 13.5 11.4 - 15.2 seconds   INR 1.0 0.8 - 1.2    Comment: (NOTE) INR goal varies based on device and disease states. Performed  at [facility], [address], [place], [place] [address]   IBC + Ferritin     Status: Abnormal   Collection Time: 02/06/23  3:44 PM  Result Value Ref Range   Iron 74 42 - 145 ug/dL   Transferrin 604.5 409.8 - 360.0 mg/dL   Saturation Ratios 11.9 (L) 20.0 - 50.0 %   Ferritin 20.9 10.0 - 291.0 ng/mL   TIBC 379.4 250.0 - 450.0 mcg/dL      Narrative & Impression  CLINICAL DATA:  Vaginal bleeding   EXAM: TRANSABDOMINAL AND TRANSVAGINAL ULTRASOUND OF PELVIS   TECHNIQUE: Both transabdominal and transvaginal ultrasound examinations of the pelvis were performed. Transabdominal technique was performed for global imaging of the pelvis including uterus, ovaries, adnexal regions, and pelvic cul-de-sac. It was necessary to proceed with endovaginal exam following the transabdominal exam to visualize the uterus endometrium adnexa.   COMPARISON:  Ultrasound 07/05/2018   FINDINGS: Uterus   Measurements: 11.2 x 7.1 x 8.2 cm = volume: 341.6 mL. Multiple uterine fibroids. The largest fibroids were measured. Left anterior intramural fibroid measuring 3.8 x 2.8 x 3.4 cm. Subserosal fundal fibroid measuring 3.8 x 2.4 x 3.5 cm. Subserosal right posterior uterine fundal fibroid measuring 2.9 x 2.3 x 2.4 cm.   Endometrium   Thickness: 15.4 mm.  No focal abnormality visualized.   Right ovary   Not seen.   Left ovary   Not seen   Other findings   Trace free fluid   IMPRESSION: 1. Enlarged fibroid uterus. 2. Endometrial thickness of 15.4 mm. If bleeding remains unresponsive to hormonal or medical therapy, sonohysterogram should be considered for focal  lesion work-up. (Ref: Radiological Reasoning: Algorithmic Workup of Abnormal Vaginal Bleeding with Endovaginal Sonography and Sonohysterography. AJR 2008; 147:W29-56) 3. Nonvisualized ovaries.     Electronically Signed   By: Jasmine Pang M.D.   On: 12/22/2022 00:37    Assessment/Plan: 47 y/o with symptomatic fibroids here for dilationand curettage hysteroscopy with myosure, -Admit to [facility] as per admit orders.  This procedure has been fully reviewed with the patient and written informed consent has been obtained.  - We discussed risks which are not limited to risks of heavy bleeding, infection, damage to uterus and sorrounding organs and possible need for additional surgeries. We discussed risks of continued heavy bleeding and irregular bleeding.   Prescilla Sours, MD. 02/10/2023, 10:26 AM.

## 2023-02-10 ENCOUNTER — Encounter (HOSPITAL_BASED_OUTPATIENT_CLINIC_OR_DEPARTMENT_OTHER)
Admission: RE | Disposition: A | Discharge status: Home / Self Care | Payer: Self-pay | Source: Home / Self Care | Attending: Obstetrics & Gynecology

## 2023-02-10 ENCOUNTER — Ambulatory Visit (HOSPITAL_BASED_OUTPATIENT_CLINIC_OR_DEPARTMENT_OTHER): Payer: BC Managed Care – PPO | Admitting: Anesthesiology

## 2023-02-10 ENCOUNTER — Encounter (HOSPITAL_BASED_OUTPATIENT_CLINIC_OR_DEPARTMENT_OTHER): Payer: Self-pay | Admitting: Obstetrics & Gynecology

## 2023-02-10 ENCOUNTER — Ambulatory Visit (HOSPITAL_BASED_OUTPATIENT_CLINIC_OR_DEPARTMENT_OTHER)
Admission: RE | Admit: 2023-02-10 | Discharge: 2023-02-10 | Disposition: A | Discharge status: Home / Self Care | Payer: BC Managed Care – PPO | Attending: Obstetrics & Gynecology | Admitting: Obstetrics & Gynecology

## 2023-02-10 ENCOUNTER — Other Ambulatory Visit: Payer: Self-pay

## 2023-02-10 DIAGNOSIS — I4519 Other right bundle-branch block: Secondary | ICD-10-CM | POA: Diagnosis not present

## 2023-02-10 DIAGNOSIS — Z01818 Encounter for other preprocedural examination: Secondary | ICD-10-CM

## 2023-02-10 DIAGNOSIS — Z86018 Personal history of other benign neoplasm: Secondary | ICD-10-CM | POA: Diagnosis not present

## 2023-02-10 DIAGNOSIS — Z09 Encounter for follow-up examination after completed treatment for conditions other than malignant neoplasm: Secondary | ICD-10-CM | POA: Insufficient documentation

## 2023-02-10 DIAGNOSIS — D250 Submucous leiomyoma of uterus: Secondary | ICD-10-CM | POA: Insufficient documentation

## 2023-02-10 HISTORY — PX: DILATATION & CURETTAGE/HYSTEROSCOPY WITH MYOSURE: SHX6511

## 2023-02-10 HISTORY — DX: Presence of spectacles and contact lenses: Z97.3

## 2023-02-10 HISTORY — DX: Other complications of anesthesia, initial encounter: T88.59XA

## 2023-02-10 LAB — CBC
HCT: 39.1 % (ref 36.0–46.0)
Hemoglobin: 12.7 g/dL (ref 12.0–15.0)
MCH: 29.3 pg (ref 26.0–34.0)
MCHC: 32.5 g/dL (ref 30.0–36.0)
MCV: 90.1 fL (ref 80.0–100.0)
Platelets: 309 10*3/uL (ref 150–400)
RBC: 4.34 MIL/uL (ref 3.87–5.11)
RDW: 12.9 % (ref 11.5–15.5)
WBC: 8.7 10*3/uL (ref 4.0–10.5)
nRBC: 0 % (ref 0.0–0.2)

## 2023-02-10 LAB — POCT PREGNANCY, URINE: Preg Test, Ur: NEGATIVE

## 2023-02-10 SURGERY — DILATATION & CURETTAGE/HYSTEROSCOPY WITH MYOSURE
Anesthesia: General | Site: Vagina

## 2023-02-10 MED ORDER — FENTANYL CITRATE (PF) 100 MCG/2ML IJ SOLN
INTRAMUSCULAR | Status: DC | PRN
  Administered 2023-02-10 (×2): 50 ug via INTRAVENOUS

## 2023-02-10 MED ORDER — MIDAZOLAM HCL 5 MG/5ML IJ SOLN
INTRAMUSCULAR | Status: DC | PRN
  Administered 2023-02-10: 2 mg via INTRAVENOUS

## 2023-02-10 MED ORDER — LIDOCAINE HCL (PF) 2 % IJ SOLN
INTRAMUSCULAR | Status: AC
  Filled 2023-02-10: qty 5

## 2023-02-10 MED ORDER — KETOROLAC TROMETHAMINE 30 MG/ML IJ SOLN
INTRAMUSCULAR | Status: AC
  Filled 2023-02-10: qty 1

## 2023-02-10 MED ORDER — ACETAMINOPHEN 500 MG PO TABS
ORAL_TABLET | ORAL | Status: AC
  Filled 2023-02-10: qty 2

## 2023-02-10 MED ORDER — PROPOFOL 10 MG/ML IV BOLUS
INTRAVENOUS | Status: AC
  Filled 2023-02-10: qty 20

## 2023-02-10 MED ORDER — ACETAMINOPHEN 500 MG PO TABS
1000.0000 mg | ORAL_TABLET | Freq: Once | ORAL | Status: AC
  Administered 2023-02-10: 1000 mg via ORAL

## 2023-02-10 MED ORDER — PROPOFOL 1000 MG/100ML IV EMUL
INTRAVENOUS | Status: AC
  Filled 2023-02-10: qty 100

## 2023-02-10 MED ORDER — DEXMEDETOMIDINE HCL IN NACL 80 MCG/20ML IV SOLN
INTRAVENOUS | Status: DC | PRN
  Administered 2023-02-10: 8 ug via INTRAVENOUS

## 2023-02-10 MED ORDER — OXYCODONE HCL 5 MG PO TABS
5.0000 mg | ORAL_TABLET | ORAL | 0 refills | Status: DC | PRN
Start: 2023-02-10 — End: 2023-05-01

## 2023-02-10 MED ORDER — IBUPROFEN 600 MG PO TABS: 600.0000 mg | ORAL_TABLET | Freq: Four times a day (QID) | ORAL | 0 refills | Status: DC | PRN

## 2023-02-10 MED ORDER — FENTANYL CITRATE (PF) 100 MCG/2ML IJ SOLN
INTRAMUSCULAR | Status: AC
  Filled 2023-02-10: qty 2

## 2023-02-10 MED ORDER — ONDANSETRON HCL 4 MG/2ML IJ SOLN
INTRAMUSCULAR | Status: AC
  Filled 2023-02-10: qty 2

## 2023-02-10 MED ORDER — PROPOFOL 10 MG/ML IV BOLUS
INTRAVENOUS | Status: DC | PRN
  Administered 2023-02-10: 200 mg via INTRAVENOUS
  Administered 2023-02-10 (×2): 20 mg via INTRAVENOUS

## 2023-02-10 MED ORDER — ONDANSETRON HCL 4 MG/2ML IJ SOLN
INTRAMUSCULAR | Status: DC | PRN
  Administered 2023-02-10: 4 mg via INTRAVENOUS

## 2023-02-10 MED ORDER — MIDAZOLAM HCL 2 MG/2ML IJ SOLN
INTRAMUSCULAR | Status: AC
  Filled 2023-02-10: qty 2

## 2023-02-10 MED ORDER — VASOPRESSIN 20 UNIT/ML IV SOLN
INTRAVENOUS | Status: DC | PRN
  Administered 2023-02-10: 20 mL

## 2023-02-10 MED ORDER — KETOROLAC TROMETHAMINE 30 MG/ML IJ SOLN
INTRAMUSCULAR | Status: DC | PRN
  Administered 2023-02-10: 30 mg via INTRAVENOUS

## 2023-02-10 MED ORDER — SODIUM CHLORIDE 0.9 % IR SOLN
Status: DC | PRN
  Administered 2023-02-10: 12000 mL

## 2023-02-10 MED ORDER — DEXAMETHASONE SODIUM PHOSPHATE 10 MG/ML IJ SOLN
INTRAMUSCULAR | Status: DC | PRN
  Administered 2023-02-10: 10 mg via INTRAVENOUS

## 2023-02-10 MED ORDER — LIDOCAINE 2% (20 MG/ML) 5 ML SYRINGE
INTRAMUSCULAR | Status: DC | PRN
  Administered 2023-02-10: 80 mg via INTRAVENOUS

## 2023-02-10 MED ORDER — SODIUM CHLORIDE (PF) 0.9 % IJ SOLN
INTRAMUSCULAR | Status: AC
  Filled 2023-02-10: qty 10

## 2023-02-10 MED ORDER — BUPIVACAINE HCL (PF) 0.25 % IJ SOLN
INTRAMUSCULAR | Status: DC | PRN
Start: 2023-02-10 — End: 2023-02-10
  Administered 2023-02-10: 20 mL

## 2023-02-10 MED ORDER — LACTATED RINGERS IV SOLN: INTRAVENOUS | Status: DC

## 2023-02-10 MED ORDER — DEXAMETHASONE SODIUM PHOSPHATE 10 MG/ML IJ SOLN
INTRAMUSCULAR | Status: AC
  Filled 2023-02-10: qty 1

## 2023-02-10 MED ORDER — POVIDONE-IODINE 10 % EX SWAB: 2.0000 | Freq: Once | CUTANEOUS | Status: DC

## 2023-02-10 MED ORDER — FENTANYL CITRATE (PF) 100 MCG/2ML IJ SOLN
25.0000 ug | INTRAMUSCULAR | Status: DC | PRN
  Administered 2023-02-10 (×2): 25 ug via INTRAVENOUS

## 2023-02-10 MED ORDER — GLYCOPYRROLATE PF 0.2 MG/ML IJ SOSY
PREFILLED_SYRINGE | INTRAMUSCULAR | Status: DC | PRN
Start: 2023-02-10 — End: 2023-02-10
  Administered 2023-02-10: .2 mg via INTRAVENOUS

## 2023-02-10 SURGICAL SUPPLY — 19 items
CATH ROBINSON RED A/P 16FR (CATHETERS) ×1 IMPLANT
DEVICE MYOSURE LITE (MISCELLANEOUS) IMPLANT
DEVICE MYOSURE REACH (MISCELLANEOUS) IMPLANT
DILATOR CANAL MILEX (MISCELLANEOUS) IMPLANT
DRSG TELFA 3X8 NADH STRL (GAUZE/BANDAGES/DRESSINGS) IMPLANT
GAUZE 4X4 16PLY ~~LOC~~+RFID DBL (SPONGE) ×2 IMPLANT
GLOVE BIOGEL PI IND STRL 7.0 (GLOVE) ×2 IMPLANT
GLOVE SURG SS PI 6.5 STRL IVOR (GLOVE) ×1 IMPLANT
GOWN STRL REUS W/TWL LRG LVL3 (GOWN DISPOSABLE) ×2 IMPLANT
KIT PROCEDURE FLUENT (KITS) ×1 IMPLANT
KIT TURNOVER CYSTO (KITS) ×1 IMPLANT
MYOSURE XL FIBROID (MISCELLANEOUS) ×2
PACK VAGINAL MINOR WOMEN LF (CUSTOM PROCEDURE TRAY) ×1 IMPLANT
PAD OB MATERNITY 4.3X12.25 (PERSONAL CARE ITEMS) ×1 IMPLANT
SEAL CERVICAL OMNI LOK (ABLATOR) IMPLANT
SEAL ROD LENS SCOPE MYOSURE (ABLATOR) ×1 IMPLANT
SLEEVE SCD COMPRESS KNEE MED (STOCKING) ×1 IMPLANT
SYSTEM TISS REMOVAL MYOSURE XL (MISCELLANEOUS) IMPLANT
TOWEL OR 17X24 6PK STRL BLUE (TOWEL DISPOSABLE) ×1 IMPLANT

## 2023-02-10 NOTE — Transfer of Care (Signed)
Immediate Anesthesia Transfer of Care Note  Patient: Joyce Gray  Procedure(s) Performed: DILATATION & CURETTAGE/HYSTEROSCOPY WITH MYOSURE (Vagina )  Patient Location: PACU  Anesthesia Type:General  Level of Consciousness: awake, alert , oriented, and patient cooperative  Airway & Oxygen Therapy: Patient Spontanous Breathing and Patient connected to nasal cannula oxygen  Post-op Assessment: Report given to RN and Post -op Vital signs reviewed and stable  Post vital signs: Reviewed and stable  Last Vitals:  Vitals Value Taken Time  BP 108/74 02/10/23 1215  Temp    Pulse 61 02/10/23 1218  Resp 17 02/10/23 1218  SpO2 100 % 02/10/23 1218  Vitals shown include unfiled device data.  Last Pain:  Vitals:   02/10/23 1209  TempSrc:   PainSc: 0-No pain      Patients Stated Pain Goal: 5 (02/10/23 0801)  Complications: No notable events documented.

## 2023-02-10 NOTE — Anesthesia Postprocedure Evaluation (Signed)
Anesthesia Post Note  Patient: Joyce Gray  Procedure(s) Performed: DILATATION & CURETTAGE/HYSTEROSCOPY WITH MYOSURE (Vagina )     Patient location during evaluation: PACU Anesthesia Type: General Level of consciousness: awake and alert Pain management: pain level controlled Vital Signs Assessment: post-procedure vital signs reviewed and stable Respiratory status: spontaneous breathing, nonlabored ventilation, respiratory function stable and patient connected to nasal cannula oxygen Cardiovascular status: blood pressure returned to baseline and stable Postop Assessment: no apparent nausea or vomiting Anesthetic complications: no  No notable events documented.  Last Vitals:  Vitals:   02/10/23 1245 02/10/23 1300  BP: 126/79 110/70  Pulse: (!) 48 (!) 52  Resp: 11 16  Temp:    SpO2: 98% 98%    Last Pain:  Vitals:   02/10/23 1300  TempSrc:   PainSc: 2                  Loistine Eberlin L Janila Arrazola

## 2023-02-10 NOTE — Interval H&P Note (Signed)
History and Physical Interval Note:  02/10/2023 10:28 AM  Joyce Gray  has presented today for surgery, with the diagnosis of Submucous leiomyoma of uterus.  The various methods of treatment have been discussed with the patient and family. After consideration of risks, benefits and other options for treatment, the patient has consented to  Procedure(s): DILATATION & CURETTAGE/HYSTEROSCOPY WITH MYOSURE (N/A) as a surgical intervention.  The patient's history has been reviewed, patient examined, no change in status, stable for surgery.  I have reviewed the patient's chart and labs.  Questions were answered to the patient's satisfaction.     Prescilla Sours

## 2023-02-10 NOTE — Discharge Instructions (Addendum)
  Joyce Gray, 1. Nothing in vagina x 2 weeks, i.e no sexual intercourse, no tampon usage.  2. Expect some vaginal bleeding for next several days, call me if with excessive bleeding requiring you to change a pad every hour.  3.  Expect some abdominal cramping, take tylenol/acetaminopen as needed.  You may also use ibuprofen if pain is not well controlled. Ensure to eat before taking ibuprofen as concurrent ibuprofen and escitalopram use increased the risks of stomach ulcers.  Use oxycodone if the first two are not working.  Please call me if pain is intolerable despite medication use. 4. Please come to the office for a 2 week post-op check.   5.  Please call me if with any other concerns such as fever, chills, nausea, vomiting, problems with urination.   I wish you a quick recovery. Dr. Sallye Ober.  Phone [phone] extension [phone].       D & C Home care Instructions:   Personal hygiene:  Used sanitary napkins for vaginal drainage not tampons. Shower or tub bathe the day after your procedure. No douching until bleeding stops. Always wipe from front to back after  Elimination.  Activity: Do not drive or operate any equipment today. The effects of the anesthesia are still present and drowsiness may result. Rest today, not necessarily flat bed rest, just take it easy. You may resume your normal activity in one to 2 days.  Sexual activity: No intercourse for two weeks or as indicated by your physician  Diet: Eat a light diet as desired this evening. You may resume a regular diet tomorrow.  Return to work: One to 2 days.  General Expectations of your surgery: Vaginal bleeding should be no heavier than a normal period. Spotting may continue up to 10 days. Mild cramps may continue for a couple of days. You may have a regular period in 2-6 weeks.  Unexpected observations call your doctor if these occur: persistent or heavy bleeding. Severe abdominal cramping or pain. Elevation of temperature greater than  100F.  Call for an appointment in one week.   No acetaminophen/Tylenol until after 2:08 pm today if needed.  No ibuprofen, Advil, Aleve, Motrin, ketorolac, meloxicam, naproxen, or other NSAIDS until after 6 pm today if needed.    Post Anesthesia Home Care Instructions  Activity: Get plenty of rest for the remainder of the day. A responsible individual must stay with you for 24 hours following the procedure.  For the next 24 hours, DO NOT: -Drive a car -Advertising copywriter -Drink alcoholic beverages -Take any medication unless instructed by your physician -Make any legal decisions or sign important papers.  Meals: Start with liquid foods such as gelatin or soup. Progress to regular foods as tolerated. Avoid greasy, spicy, heavy foods. If nausea and/or vomiting occur, drink only clear liquids until the nausea and/or vomiting subsides. Call your physician if vomiting continues.  Special Instructions/Symptoms: Your throat may feel dry or sore from the anesthesia or the breathing tube placed in your throat during surgery. If this causes discomfort, gargle with warm salt water. The discomfort should disappear within 24 hours.

## 2023-02-10 NOTE — Anesthesia Procedure Notes (Signed)
Procedure Name: LMA Insertion Date/Time: 02/10/2023 10:44 AM  Performed by: Bishop Limbo, CRNAPre-anesthesia Checklist: Patient identified, Emergency Drugs available, Suction available and Patient being monitored Patient Re-evaluated:Patient Re-evaluated prior to induction Oxygen Delivery Method: Circle System Utilized Preoxygenation: Pre-oxygenation with 100% oxygen Induction Type: IV induction Ventilation: Mask ventilation without difficulty LMA: LMA inserted LMA Size: 4.0 Number of attempts: 1 Placement Confirmation: positive ETCO2 Tube secured with: Tape Dental Injury: Teeth and Oropharynx as per pre-operative assessment

## 2023-02-10 NOTE — Anesthesia Preprocedure Evaluation (Addendum)
Anesthesia Evaluation  Patient identified by MRN, date of birth, ID band Patient awake    Reviewed: Allergy & Precautions, NPO status , Patient's Chart, lab work & pertinent test results  Airway Mallampati: III  TM Distance: >3 FB Neck ROM: Full    Dental no notable dental hx. (+) Teeth Intact, Dental Advisory Given   Pulmonary neg pulmonary ROS   Pulmonary exam normal breath sounds clear to auscultation       Cardiovascular Normal cardiovascular exam+ dysrhythmias (RBBB)  Rhythm:Regular Rate:Normal     Neuro/Psych  PSYCHIATRIC DISORDERS Anxiety Depression    negative neurological ROS     GI/Hepatic negative GI ROS, Neg liver ROS,,,  Endo/Other  negative endocrine ROS    Renal/GU negative Renal ROS  negative genitourinary   Musculoskeletal  (+) Arthritis ,    Abdominal   Peds  Hematology negative hematology ROS (+)   Anesthesia Other Findings   Reproductive/Obstetrics                             Anesthesia Physical Anesthesia Plan  ASA: 2  Anesthesia Plan: General   Post-op Pain Management: Tylenol PO (pre-op)*   Induction: Intravenous  PONV Risk Score and Plan: 3 and Ondansetron, Dexamethasone and Midazolam  Airway Management Planned: LMA  Additional Equipment:   Intra-op Plan:   Post-operative Plan: Extubation in OR  Informed Consent: I have reviewed the patients History and Physical, chart, labs and discussed the procedure including the risks, benefits and alternatives for the proposed anesthesia with the patient or authorized representative who has indicated his/her understanding and acceptance.     Dental advisory given  Plan Discussed with: CRNA  Anesthesia Plan Comments:        Anesthesia Quick Evaluation

## 2023-02-10 NOTE — Op Note (Addendum)
Patient: Joyce Gray DOB: Mar 31, 1976 MRN: [id number] Date of service: 02/10/23.   PREOP DIAGNOSIS:  1.Uterus with submucosal fibroid. 2. Prolonged bleeding.      Post operative diagnosis: Same as above.  Procedures: Dilation and Currettage hysteroscopy with Myosure.   Surgeon: Dr. Hoover Browns.  Anesthesiologist: Dr. Armond Hang, Romie Jumper, General LMA.  Complications: None.  Fluid deficit: 2150 cc.  Urine: patient voided before the procedure.   EBL: 3 cc.   Indications: 47 y/o P0 with a history of prolonged periods and submucosal leiomyoma here for a dilation and currettage hysteroscopy with myosure.  A previous similar procedure at the office had allowed removal of about 60% of the submucosal lesion.  She presented for removal of the remaining submucosal lesion.   Procedure:  Informed consent was obtained from the patient to undergo the procedure.  She was taken to the operating room and anesthesia was administered without difficulty. An exam was then performed under anesthesia revealing a small anteverted uterus and a closed cervix. There were no palpable adnexal masses.  She was prepped with betadine vaginally and draped in the usual sterile fashion.  A graves speculum was used to view the cervix. Single-tooth tenaculum was placed on the cervix. 20 cc of 0.25 marcaine was injected as a paracervical block.  20 cc vasopressin was mixed with 200 cc NS, total 20cc of the mixture was injected on the ectocervical stroma at 2, 5, 7 and 10 o'clock positions.  The cervix was dilated to #15 pratt dilators and the Omni scope was advanced through the internal cervical os. The uterine cavity and uterus was noted to have about a 4 to 5cm  submucosal lesion on the right fundal wall.  This lesion was shaved off with the Myosure XL under direct visualization. Good progress was made with obtaining tissue.  The [facility] excel was switched out about half way through the procedure as it appeared to be cutting  less efficiently.  About 70 % of the lesion was removed and myosure procedure had to be ended due to fluid limits.  Bilateral tubal ostia were noted during the procedure.  Myosure was removed with the scope then gentle sharp curettage was done. All instruments were then removed and the patient was awoken from anesthesia and taken to recovery room in stable condition  Specimens:  Endometrial shavings of submucosal fibroid.  Endometrial curettings.      Disposition: Stable to PACU.    Dr. Hoover Browns.  02/10/23.

## 2023-02-10 NOTE — Interval H&P Note (Signed)
History and Physical Interval Note:  02/10/2023 10:27 AM  Joyce Gray  has presented today for surgery, with the diagnosis of Submucous leiomyoma of uterus.  The various methods of treatment have been discussed with the patient and family. After consideration of risks, benefits and other options for treatment, the patient has consented to  Procedure(s): DILATATION & CURETTAGE/HYSTEROSCOPY WITH MYOSURE (N/A) as a surgical intervention.  The patient's history has been reviewed, patient examined, no change in status, stable for surgery.  I have reviewed the patient's chart and labs.  Questions were answered to the patient's satisfaction.     Prescilla Sours, MD.

## 2023-02-13 ENCOUNTER — Encounter (HOSPITAL_BASED_OUTPATIENT_CLINIC_OR_DEPARTMENT_OTHER): Payer: Self-pay | Admitting: Obstetrics & Gynecology

## 2023-02-13 LAB — SURGICAL PATHOLOGY

## 2023-04-07 ENCOUNTER — Other Ambulatory Visit: Payer: Self-pay | Admitting: Obstetrics and Gynecology

## 2023-04-07 ENCOUNTER — Encounter: Payer: Self-pay | Admitting: Obstetrics and Gynecology

## 2023-04-07 DIAGNOSIS — D259 Leiomyoma of uterus, unspecified: Secondary | ICD-10-CM

## 2023-05-01 ENCOUNTER — Ambulatory Visit: Payer: BC Managed Care – PPO | Admitting: Family Medicine

## 2023-05-01 VITALS — BP 125/86 | HR 57 | Temp 97.3°F | Ht 69.0 in | Wt 226.2 lb

## 2023-05-01 DIAGNOSIS — N946 Dysmenorrhea, unspecified: Secondary | ICD-10-CM

## 2023-05-01 DIAGNOSIS — Z23 Encounter for immunization: Secondary | ICD-10-CM | POA: Diagnosis not present

## 2023-05-01 DIAGNOSIS — E611 Iron deficiency: Secondary | ICD-10-CM

## 2023-05-01 LAB — COMPREHENSIVE METABOLIC PANEL
ALT: 16 U/L (ref 0–35)
AST: 21 U/L (ref 0–37)
Albumin: 4.5 g/dL (ref 3.5–5.2)
Alkaline Phosphatase: 82 U/L (ref 39–117)
BUN: 13 mg/dL (ref 6–23)
CO2: 21 meq/L (ref 19–32)
Calcium: 9.6 mg/dL (ref 8.4–10.5)
Chloride: 106 meq/L (ref 96–112)
Creatinine, Ser: 0.81 mg/dL (ref 0.40–1.20)
GFR: 86.93 mL/min (ref >60.00)
Glucose, Bld: 79 mg/dL (ref 70–99)
Potassium: 4.2 meq/L (ref 3.5–5.1)
Sodium: 139 meq/L (ref 135–145)
Total Bilirubin: 0.3 mg/dL (ref 0.2–1.2)
Total Protein: 8 g/dL (ref 6.0–8.3)

## 2023-05-01 LAB — CBC
HCT: 41.8 % (ref 36.0–46.0)
Hemoglobin: 13.9 g/dL (ref 12.0–15.0)
MCHC: 33.3 g/dL (ref 30.0–36.0)
MCV: 87.6 fL (ref 78.0–100.0)
Platelets: 370 10*3/uL (ref 150.0–400.0)
RBC: 4.78 Mil/uL (ref 3.87–5.11)
RDW: 14.6 % (ref 11.5–15.5)
WBC: 10.9 10*3/uL — ABNORMAL HIGH (ref 4.0–10.5)

## 2023-05-01 LAB — IBC + FERRITIN
Ferritin: 30.6 ng/mL (ref 10.0–291.0)
Iron: 90 ug/dL (ref 42–145)
Saturation Ratios: 20.7 % (ref 20.0–50.0)
TIBC: 434 ug/dL (ref 250.0–450.0)
Transferrin: 310 mg/dL (ref 212.0–360.0)

## 2023-05-01 LAB — HEMOGLOBIN A1C: Hgb A1c MFr Bld: 5.7 % (ref 4.6–6.5)

## 2023-05-01 NOTE — Assessment & Plan Note (Addendum)
Secondary to dysmenorrhea.  She is currently on iron supplement.  Will recheck labs today.

## 2023-05-01 NOTE — Assessment & Plan Note (Signed)
Had lengthy discussion with patient today regarding her dysmenorrhea secondary to uterine fibroids.  She is following with gynecology for this.  She is currently on Megace.  She did not do a with OCPs and had unsuccessful myomectomy attempts.  She may do well with an IUD and will discuss this with gynecology.  Also considering embolization.  She is putting off hysterectomy if at all possible.  She will follow-up with gynecology soon to discuss next steps in management.

## 2023-05-01 NOTE — Patient Instructions (Addendum)
It was very nice to see you today!  We will check blood work.  You can talk with your gynecologist about an IUD.  Return if symptoms worsen or fail to improve.   Take care, Dr [name]  PLEASE NOTE:  If you had any lab tests, please let us know if you have not heard back within a few days. You may see your results on mychart before we have a chance to review them but we will give you a call once they are reviewed by [name].   If we ordered any referrals today, please let us know if you have not heard from their office within the next week.   If you had any urgent prescriptions sent in today, please check with the pharmacy within an hour of our visit to make sure the prescription was transmitted appropriately.   Please try these tips to maintain a healthy lifestyle:  Eat at least 3 REAL meals and 1-2 snacks per day.  Aim for no more than 5 hours between eating.  If you eat breakfast, please do so within one hour of getting up.   Each meal should contain half fruits/vegetables, one quarter protein, and one quarter carbs (no bigger than a computer mouse)  Cut down on sweet beverages. This includes juice, soda, and sweet tea.   Drink at least 1 glass of water with each meal and aim for at least 8 glasses per day  Exercise at least 150 minutes every week.

## 2023-05-01 NOTE — Progress Notes (Signed)
   Joyce Gray is a 47 y.o. female who presents today for an office visit.  Assessment/Plan:  Chronic Problems Addressed Today: Dysmenorrhea Had lengthy discussion with patient today regarding her dysmenorrhea secondary to uterine fibroids.  She is following with gynecology for this.  She is currently on Megace.  She did not do a with OCPs and had unsuccessful myomectomy attempts.  She may do well with an IUD and will discuss this with gynecology.  Also considering embolization.  She is putting off hysterectomy if at all possible.  She will follow-up with gynecology soon to discuss next steps in management.  Iron deficiency Secondary to dysmenorrhea.  She is currently on iron supplement.  Will recheck labs today.    Subjective:  HPI:  See Assessment / plan for status of chronic conditions. Patient is here today for follow-up.  We last saw her 5 months ago for annual physical.  At that time was noted to have borderline low iron panel.  We recommended taking ferrous sulfate 65 mg every other day.  Shortly after our visit she had a severe episode of vaginal bleeding.  Ultrasound showed fibroids.  She has been following with gynecology since then for management for her fibroids and dysmenorrhea.  She is currently on Megace which does help slow down the bleeding.  She did have a couple of attempts at myomectomy without much success.  They also did try switching her to an OCP however she still has persistent bleeding with this and is now back on the Megace.  They are currently considering embolization however she is interested in discussing alternative treatment options.        Objective:  Physical Exam: BP 125/86   Pulse (!) 57   Temp (!) 97.3 F (36.3 C) (Temporal)   Ht 5\' 9"  (1.753 m)   Wt 226 lb 3.2 oz (102.6 kg)   SpO2 98%   BMI 33.40 kg/m   Gen: No acute distress, resting comfortably Neuro: Grossly normal, moves all extremities Psych: Normal affect and thought content       Nadeen Shipman M. [name], MD 05/01/2023 11:04 AM

## 2023-05-04 ENCOUNTER — Other Ambulatory Visit: Payer: Self-pay | Admitting: *Deleted

## 2023-05-04 DIAGNOSIS — D729 Disorder of white blood cells, unspecified: Secondary | ICD-10-CM

## 2023-05-04 NOTE — Progress Notes (Signed)
Her white blood cell count is mildly elevated but the rest of her labs are stable.  She is a bit on the lower side of her iron counts but she is not iron deficient.  It is a good idea for her to continue with iron supplementation while she is dealing with irregular periods.   Her A1c is mildly elevated.  She should continue to work on diet and exercise and we can recheck this again in a year or so.  Her elevated white blood cell count is likely nothing to be concerned with however I would like to recheck this again in a few weeks.  Please place future order for CBC with differential.

## 2023-06-28 ENCOUNTER — Ambulatory Visit
Admission: RE | Admit: 2023-06-28 | Discharge: 2023-06-28 | Disposition: A | Discharge status: Home / Self Care | Payer: Self-pay | Source: Ambulatory Visit | Attending: Obstetrics and Gynecology | Admitting: Obstetrics and Gynecology

## 2023-06-28 DIAGNOSIS — D259 Leiomyoma of uterus, unspecified: Secondary | ICD-10-CM

## 2023-06-28 MED ORDER — GADOPICLENOL 0.5 MMOL/ML IV SOLN
10.0000 mL | Freq: Once | INTRAVENOUS | Status: AC | PRN
  Administered 2023-06-28: 10 mL via INTRAVENOUS

## 2023-07-03 ENCOUNTER — Ambulatory Visit
Admission: RE | Admit: 2023-07-03 | Discharge: 2023-07-03 | Disposition: A | Discharge status: Home / Self Care | Payer: 59 | Source: Ambulatory Visit | Attending: Obstetrics and Gynecology | Admitting: Obstetrics and Gynecology

## 2023-07-03 VITALS — BP 138/94 | HR 73 | Temp 99.1°F | Resp 17

## 2023-07-03 DIAGNOSIS — E611 Iron deficiency: Secondary | ICD-10-CM

## 2023-07-03 DIAGNOSIS — D259 Leiomyoma of uterus, unspecified: Secondary | ICD-10-CM

## 2023-07-03 DIAGNOSIS — N946 Dysmenorrhea, unspecified: Secondary | ICD-10-CM

## 2023-07-03 DIAGNOSIS — E559 Vitamin D deficiency, unspecified: Secondary | ICD-10-CM

## 2023-07-03 HISTORY — PX: IR RADIOLOGIST EVAL & MGMT: IMG5224

## 2023-07-03 NOTE — Consult Note (Signed)
 Chief Complaint: Patient was seen in consultation today for uterine fibroid embolization at the request of Taavon,Richard  Referring Physician(s): Taavon,Richard  History of Present Illness: Joyce Gray is a 48 y.o. female with a history of known uterine fibroids since at least 2017 with associated menorrhagia. She has tried numerous hormonal therapies with little success or side effects. She is currently on Megestrol  and has some bleeding daily, but not heavy bleeding currently. The Megestrol  has caused some increase in blood pressure and hemoglobin A1C. She underwent D&C with hysteroscopy and Myosure in the office in August, 2024 and additionally at [facility] in Sept, 2024 for additional submucosal fibroid removal by Dr. Caridad Chard. She does not feel that this resulted in significant improvement in menorrhagia. She has bulk symptoms of increased urinary frequency and gets up about 2-3 times nightly to urinate. She has a history of HPV and yeast infection. Pap smear in January of this year was normal. Endometrial biopsy in August last year was normal. An MRI of the pelvis was performed on 06/28/2023. She has consulted several gynecologists and is not prepared currently for hysterectomy, as she is still single and does not want to eliminate the possibility of future pregnancy.  Past Medical History:  Diagnosis Date   Abnormal Pap smear of cervix 1999, 2016   Allergy    Anemia    Anxiety    Arthritis    Complication of anesthesia    felt like heart rate was a little low after d and c 01-16-2023 ( done in office)   Depression    Hyperlipidemia    Incomplete right bundle branch block    dx in 2009  stress test 2009 normal no cardiologist, repeat ekg 2013 was nsr   Lyme disease 2020   [facility] spotted fever 02/2019   Wears glasses or contacts     Past Surgical History:  Procedure Laterality Date   CRYOTHERAPY  1999   for abnormal pap smear   d & c hysteroscopy myomectomy   01/16/2023   done in office   DILATATION & CURETTAGE/HYSTEROSCOPY WITH MYOSURE N/A 02/10/2023   Procedure: DILATATION & CURETTAGE/HYSTEROSCOPY WITH MYOSURE;  Surgeon: Vernal Gold, MD;  Location: [facility];  Service: Gynecology;  Laterality: N/A;   TYMPANOSTOMY TUBE PLACEMENT     1980 and 1982   WISDOM TOOTH EXTRACTION Bilateral 1996   wisdome teeth extraction  1995    Allergies: Patient has no known allergies.  Medications: Prior to Admission medications   Medication Sig Start Date End Date Taking? Authorizing Provider  escitalopram  (LEXAPRO ) 5 MG tablet Take 5 mg by mouth daily.   Yes [provider]  megestrol  (MEGACE ) 40 MG tablet Take 1 tablet (40 mg total) by mouth daily. 12/22/22  Yes Rancour, Mara Seminole, MD  Multiple Vitamin (MULTI-VITAMIN DAILY PO) SMARTSIG:1 By Mouth 05/18/21  Yes [provider]  Omega-3 Fatty Acids (FISH OIL) 300 MG CAPS Take by mouth. 2 pills per day   Yes [provider]  OVER THE COUNTER MEDICATION Heme Plus, 20 mg, two  tablets daily. 50 mcg folate. 75 mcg B12.   Yes [provider]  VITAMIN D  PO Take by mouth. Vitamin d  5000 units 5 days per week, vitamin d  10000 units 2 days per week   Yes [provider]     Family History  Problem Relation Age of Onset   Hyperlipidemia Mother    Alcohol abuse Mother    Melanoma Father  Bipolar disorder Brother    Breast cancer Maternal Aunt 63   Melanoma Paternal Aunt 45   Heart disease Maternal Grandfather    Colon cancer Neg Hx    Esophageal cancer Neg Hx    Rectal cancer Neg Hx    Stomach cancer Neg Hx     Social History   Socioeconomic History   Marital status: Single    Spouse name: Not on file   Number of children: 0   Years of education: 16   Highest education level: Bachelor's degree (e.g., BA, AB, BS)  Occupational History   Occupation: Magazine features editor: [place]  Tobacco Use   Smoking status: Never    Passive exposure:  Past   Smokeless tobacco: Never  Vaping Use   Vaping status: Never Used  Substance and Sexual Activity   Alcohol use: Yes    Comment: Rare   Drug use: No   Sexual activity: Not Currently    Partners: Male  Other Topics Concern   Not on file  Social History Narrative   Single. Barrister's clerk. Fun/Hobby: Hiking, reading, coffee with friends, dancing.    Social Drivers of Corporate investment banker Strain: Low Risk  (05/01/2023)   Overall Financial Resource Strain (CARDIA)    Difficulty of Paying Living Expenses: Not very hard  Food Insecurity: No Food Insecurity (05/01/2023)   Hunger Vital Sign    Worried About Running Out of Food in the Last Year: Never true    Ran Out of Food in the Last Year: Never true  Transportation Needs: No Transportation Needs (05/01/2023)   PRAPARE - Administrator, Civil Service (Medical): No    Lack of Transportation (Non-Medical): No  Physical Activity: Sufficiently Active (05/01/2023)   Exercise Vital Sign    Days of Exercise per Week: 3 days    Minutes of Exercise per Session: 60 min  Stress: No Stress Concern Present (05/01/2023)   [employer] of Occupational Health - Occupational Stress Questionnaire    Feeling of Stress : Only a little  Social Connections: Moderately Integrated (05/01/2023)   Social Connection and Isolation Panel [NHANES]    Frequency of Communication with Friends and Family: Three times a week    Frequency of Social Gatherings with Friends and Family: Once a week    Attends Religious Services: More than 4 times per year    Active Member of [employer] or Organizations: Yes    Attends Engineer, structural: More than 4 times per year    Marital Status: Never married     Review of Systems: A 12 point ROS discussed and pertinent positives are indicated in the HPI above.  All other systems are negative.  Review of Systems  Constitutional: Negative.   Respiratory: Negative.    Cardiovascular: Negative.    Gastrointestinal: Negative.   Genitourinary:  Positive for frequency, menstrual problem, pelvic pain and vaginal bleeding.  Musculoskeletal: Negative.   Neurological: Negative.     Vital Signs: BP (!) 138/94   Pulse 73   Temp 99.1 F (37.3 C) (Oral)   Resp 17   SpO2 97%     Physical Exam Vitals reviewed.  Constitutional:      Appearance: Normal appearance.  Cardiovascular:     Rate and Rhythm: Normal rate and regular rhythm.     Heart sounds: Normal heart sounds. No murmur heard.    No friction rub. No gallop.  Pulmonary:     Effort: Pulmonary effort  is normal. No respiratory distress.     Breath sounds: Normal breath sounds. No stridor. No wheezing, rhonchi or rales.  Abdominal:     General: Bowel sounds are normal. There is no distension.     Palpations: Abdomen is soft.     Tenderness: There is no abdominal tenderness. There is no guarding or rebound.     Comments: Uterus not palpable transabdominally.  Musculoskeletal:        General: No swelling.  Skin:    General: Skin is warm and dry.  Neurological:     General: No focal deficit present.     Mental Status: She is alert and oriented to person, place, and time.     Imaging: MR PELVIS W WO CONTRAST Result Date: 06/28/2023 CLINICAL DATA:  Symptomatic uterine fibroids.  Treatment planning. EXAM: MRI PELVIS WITHOUT AND WITH CONTRAST TECHNIQUE: Multiplanar multisequence MR imaging of the pelvis was performed both before and after administration of intravenous contrast. CONTRAST:  10 mL Vueway  COMPARISON:  None Available. FINDINGS: Lower Urinary Tract: No bladder or urethral abnormality identified. Bowel:  Unremarkable visualized pelvic bowel loops. Vascular/Lymphatic: No pathologically enlarged lymph nodes or other significant abnormality. Reproductive: -- Uterus: Measures 12.3 x 8.0 by 9.3 cm (volume = 480 cm^3). Multiple small fibroids are seen which involve the uterus diffusely. These are submucosal, intramural, and  subserosal in location. Largest fibroid is seen in the fundus measuring 4.8 cm in maximum diameter. -- Intracavitary fibroids:  None. -- Pedunculated fibroids: A 2.2 cm pedunculated fibroid is seen which arises from the right lateral fundus. -- Fibroid contrast enhancement: All fibroids show contrast enhancement, without significant degeneration/devascularization. -- Right ovary:  Appears normal.  No mass identified. -- Left ovary:  Appears normal.  No mass identified. Other: No abnormal free fluid. Musculoskeletal:  Unremarkable. IMPRESSION: Diffuse uterine involvement by multiple small fibroids, largest measuring 4.8 cm. 2.2 cm pedunculated fibroid, which arises from the right lateral fundus. Normal appearance of both ovaries. Electronically Signed   By: Marlyce Sine M.D.   On: 06/28/2023 17:04    Labs:  CBC: Recent Labs    11/28/22 0908 12/21/22 1753 12/21/22 2336 02/10/23 0822 05/01/23 1113  WBC 9.8 15.6*  --  8.7 10.9*  HGB 12.3 11.8* 11.3* 12.7 13.9  HCT 37.9 35.7* 35.6* 39.1 41.8  PLT 374.0 370  --  309 370.0    COAGS: Recent Labs    12/21/22 2336  INR 1.0    BMP: Recent Labs    11/28/22 0908 05/01/23 1113  NA 138 139  K 4.7 4.2  CL 103 106  CO2 26 21  GLUCOSE 80 79  BUN 10 13  CALCIUM 9.4 9.6  CREATININE 0.76 0.81    LIVER FUNCTION TESTS: Recent Labs    11/28/22 0908 05/01/23 1113  BILITOT 0.2 0.3  AST 16 21  ALT 12 16  ALKPHOS 54 82  PROT 7.0 8.0  ALBUMIN 3.9 4.5      Assessment and Plan:  I reviewed the pelvic MRI from 2/5 with Joyce Gray. Based on my measurements, I estimated uterine volume at 508 mL and estimated roughly 10 uterine fibroids ranging from 1.2 to 4.8 cm in greatest diameter located in subserosal, intramural and submucosal locations. No grossly pedunculated fibroids identified. All fibroids enhance after administration of Gadolinium. No adnexal masses or significant adenomyosis.  I discussed treatment options with Joyce Gray  including hysterectomy and uterine artery embolization. There is a slight chance she may want to have children and  she is not yet prepared to have a hysterectomy. I did tell her that uterine fibroid embolization may decrease fertility but does not eliminate the possibility of future pregnancy. She is a candidate for embolization based on her MRI and we discussed technical details of the procedure, risks, outcomes and recovery. She was well prepared with detailed questions.  After discussion, Joyce Gray is interested in pursuing and scheduling uterine fibroid embolization. 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 The procedure will be performed at [facility] and involve overnight observation. We will begin the authorization and scheduling process.  Thank you for this interesting consult.  I greatly enjoyed meeting Joyce Gray and look forward to participating in their care.  A copy of this report was sent to the requesting provider on this date.  Electronically Signed: Aileen Alexanders 07/03/2023, 9:17 AM    I spent a total of 40 Minutes in face to face in clinical consultation, greater than 50% of which was counseling/coordinating care for symptomatic uterine fibroids.

## 2023-08-09 ENCOUNTER — Other Ambulatory Visit (HOSPITAL_COMMUNITY): Payer: Self-pay | Admitting: Interventional Radiology

## 2023-08-09 DIAGNOSIS — D259 Leiomyoma of uterus, unspecified: Secondary | ICD-10-CM

## 2023-08-31 ENCOUNTER — Other Ambulatory Visit: Payer: Self-pay | Admitting: Radiology

## 2023-08-31 DIAGNOSIS — D259 Leiomyoma of uterus, unspecified: Secondary | ICD-10-CM

## 2023-08-31 NOTE — H&P (Signed)
 Chief Complaint: Symptomatic uterine fibroids; referred for bilateral uterine artery embolization  Referring Provider(s): Taavon,R  Supervising Physician: Irish Lack  Patient Status: [facility] - Out-pt  TBA  History of Present Illness: Joyce Gray is a 48 y.o. female with PMH sig for anxiety, arthritis, depression, HLD, prior lyme disease/ RMSF, and uterine fibroids since at least 2017 with associated menorrhagia. She has tried numerous hormonal therapies with little success or side effects. She underwent D&C with hysteroscopy and Myosure in the office in August, 2024 and additionally at [facility] in Sept, 2024 for additional submucosal fibroid removal by Dr. Sallye Ober. She does not feel that this resulted in significant improvement in menorrhagia. She has bulk symptoms of increased urinary frequency and gets up about 2-3 times nightly to urinate. She was seen in consultation by Dr. Fredia Sorrow on 07/03/23 to discuss treatment options for her symptomatic fibroids and was deemed an appropriate candidate for bilateral uterine artery embolization. She presents today for the procedure.   *** Patient is Full Code  Past Medical History:  Diagnosis Date   Abnormal Pap smear of cervix 1999, 2016   Allergy    Anemia    Anxiety    Arthritis    Complication of anesthesia    felt like heart rate was a little low after d and c 01-16-2023 ( done in office)   Depression    Hyperlipidemia    Incomplete right bundle branch block    dx in 2009  stress test 2009 normal no cardiologist, repeat ekg 2013 was nsr   Lyme disease 2020   [facility] spotted fever 02/2019   Wears glasses or contacts     Past Surgical History:  Procedure Laterality Date   CRYOTHERAPY  1999   for abnormal pap smear   d & c hysteroscopy myomectomy  01/16/2023   done in office   DILATATION & CURETTAGE/HYSTEROSCOPY WITH MYOSURE N/A 02/10/2023   Procedure: DILATATION & CURETTAGE/HYSTEROSCOPY WITH MYOSURE;  Surgeon: Hoover Browns, MD;  Location: [facility];  Service: Gynecology;  Laterality: N/A;   IR RADIOLOGIST EVAL & MGMT  07/03/2023   TYMPANOSTOMY TUBE PLACEMENT     1980 and 1982   WISDOM TOOTH EXTRACTION Bilateral 1996   wisdome teeth extraction  1995    Allergies: Patient has no known allergies.  Medications: Prior to Admission medications   Medication Sig Start Date End Date Taking? Authorizing Provider  escitalopram (LEXAPRO) 5 MG tablet Take 5 mg by mouth daily.    [provider]  megestrol (MEGACE) 40 MG tablet Take 1 tablet (40 mg total) by mouth daily. 12/22/22   Rancour, Jeannett Senior, MD  Multiple Vitamin (MULTI-VITAMIN DAILY PO) SMARTSIG:1 By Mouth 05/18/21   [provider]  Omega-3 Fatty Acids (FISH OIL) 300 MG CAPS Take by mouth. 2 pills per day    [provider]  OVER THE COUNTER MEDICATION Heme Plus, 20 mg, two  tablets daily. 50 mcg folate. 75 mcg B12.    [provider]  VITAMIN D PO Take by mouth. Vitamin d 5000 units 5 days per week, vitamin d 10272 units 2 days per week    [provider]     Family History  Problem Relation Age of Onset   Hyperlipidemia Mother    Alcohol abuse Mother    Melanoma Father    Bipolar disorder Brother    Breast cancer Maternal Aunt 76   Melanoma Paternal Aunt 45   Heart disease Maternal Grandfather  Colon cancer Neg Hx    Esophageal cancer Neg Hx    Rectal cancer Neg Hx    Stomach cancer Neg Hx     Social History   Socioeconomic History   Marital status: Single    Spouse name: Not on file   Number of children: 0   Years of education: 16   Highest education level: Bachelor's degree (e.g., BA, AB, BS)  Occupational History   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 Barrister's clerk. Fun/Hobby: Hiking, reading, coffee with friends, dancing.    Social Drivers of Corporate investment banker Strain: Low Risk  (05/01/2023)   Overall Financial Resource Strain (CARDIA)    Difficulty of Paying Living Expenses: Not very hard  Food Insecurity: No Food Insecurity (05/01/2023)   Hunger Vital Sign    Worried About Running Out of Food in the Last Year: Never true    Ran Out of Food in the Last Year: Never true  Transportation Needs: No Transportation Needs (05/01/2023)   PRAPARE - Administrator, Civil Service (Medical): No    Lack of Transportation (Non-Medical): No  Physical Activity: Sufficiently Active (05/01/2023)   Exercise Vital Sign    Days of Exercise per Week: 3 days    Minutes of Exercise per Session: 60 min  Stress: No Stress Concern Present (05/01/2023)   [employer] of Occupational Health - Occupational Stress Questionnaire    Feeling of Stress : Only a little  Social Connections: Moderately Integrated (05/01/2023)   Social Connection and Isolation Panel [NHANES]    Frequency of Communication with Friends and Family: Three times a week    Frequency of Social Gatherings with Friends and Family: Once a week    Attends Religious Services: More than 4 times per year    Active Member of [employer] or Organizations: Yes    Attends Engineer, structural: More than 4 times per year    Marital Status: Never married       Review of Systems  Vital Signs:   Advance Care Plan: no documents on file  Physical Exam  Imaging: No results found.  Labs:  CBC: Recent Labs    11/28/22 0908 12/21/22 1753 12/21/22 2336 02/10/23 0822 05/01/23 1113  WBC 9.8 15.6*  --  8.7 10.9*  HGB 12.3 11.8* 11.3* 12.7 13.9  HCT 37.9 35.7* 35.6* 39.1 41.8  PLT 374.0 370  --  309 370.0    COAGS: Recent Labs     12/21/22 2336  INR 1.0    BMP: Recent Labs    11/28/22 0908 05/01/23 1113  NA 138 139  K 4.7 4.2  CL 103 106  CO2 26 21  GLUCOSE 80 79  BUN 10 13  CALCIUM 9.4 9.6  CREATININE 0.76 0.81    LIVER FUNCTION TESTS: Recent Labs    11/28/22 0908 05/01/23 1113  BILITOT 0.2 0.3  AST 16 21  ALT 12 16  ALKPHOS 54 82  PROT 7.0 8.0  ALBUMIN 3.9 4.5    TUMOR MARKERS: No  results for input(s): "AFPTM", "CEA", "CA199", "CHROMGRNA" in the last 8760 hours.  Assessment and Plan: 48 y.o. female with PMH sig for anxiety, arthritis, depression, HLD, prior lyme disease/ RMSF, and uterine fibroids since at least 2017 with associated menorrhagia. She has tried numerous hormonal therapies with little success or side effects. She underwent D&C with hysteroscopy and Myosure in the office in August, 2024 and additionally at [facility] in Sept, 2024 for additional submucosal fibroid removal by Dr. Sallye Ober. She does not feel that this resulted in significant improvement in menorrhagia. She has bulk symptoms of increased urinary frequency and gets up about 2-3 times nightly to urinate. She was seen in consultation by Dr. Fredia Sorrow on 07/03/23 to discuss treatment options for her symptomatic fibroids and was deemed an appropriate candidate for bilateral uterine artery embolization. She presents today for the procedure.Risks and benefits of procedure were discussed with the patient including, but not limited to bleeding, infection, vascular injury or contrast induced renal failure.  This interventional procedure involves the use of X-rays and because of the nature of the planned procedure, it is possible that we will have prolonged use of X-ray fluoroscopy.  Potential radiation risks to you include (but are not limited to) the following: - A slightly elevated risk for cancer  several years later in life. This risk is typically less than 0.5% percent. This risk is low in comparison to the normal incidence of  human cancer, which is 33% for women and 50% for men according to the American Cancer Society. - Radiation induced injury can include skin redness, resembling a rash, tissue breakdown / ulcers and hair loss (which can be temporary or permanent).   The likelihood of either of these occurring depends on the difficulty of the procedure and whether you are sensitive to radiation due to previous procedures, disease, or genetic conditions.   IF your procedure requires a prolonged use of radiation, you will be notified and given written instructions for further action.  It is your responsibility to monitor the irradiated area for the 2 weeks following the procedure and to notify your physician if you are concerned that you have suffered a radiation induced injury.    All of the patient's questions were answered, patient is agreeable to proceed.  Consent signed and in chart.      Thank you for allowing our service to participate in Joyce Gray 's care.  Electronically Signed: D. Jeananne Rama, PA-C   08/31/2023, 3:28 PM      I spent a total of    25 Minutes in face to face in clinical consultation, greater than 50% of which was counseling/coordinating care for bilateral uterine artery embolization

## 2023-09-01 ENCOUNTER — Other Ambulatory Visit: Payer: Self-pay

## 2023-09-01 ENCOUNTER — Other Ambulatory Visit (HOSPITAL_COMMUNITY): Payer: Self-pay | Admitting: Interventional Radiology

## 2023-09-01 ENCOUNTER — Ambulatory Visit (HOSPITAL_COMMUNITY)
Admission: RE | Admit: 2023-09-01 | Discharge: 2023-09-01 | Disposition: A | Discharge status: Home / Self Care | Source: Ambulatory Visit | Attending: Interventional Radiology | Admitting: Interventional Radiology

## 2023-09-01 ENCOUNTER — Observation Stay (HOSPITAL_COMMUNITY)
Admission: RE | Admit: 2023-09-01 | Discharge: 2023-09-02 | Disposition: A | Discharge status: Home / Self Care | Source: Ambulatory Visit | Attending: Interventional Radiology | Admitting: Interventional Radiology

## 2023-09-01 ENCOUNTER — Encounter (HOSPITAL_COMMUNITY): Payer: Self-pay

## 2023-09-01 DIAGNOSIS — E785 Hyperlipidemia, unspecified: Secondary | ICD-10-CM | POA: Insufficient documentation

## 2023-09-01 DIAGNOSIS — F419 Anxiety disorder, unspecified: Secondary | ICD-10-CM | POA: Insufficient documentation

## 2023-09-01 DIAGNOSIS — F32A Depression, unspecified: Secondary | ICD-10-CM | POA: Insufficient documentation

## 2023-09-01 DIAGNOSIS — D259 Leiomyoma of uterus, unspecified: Secondary | ICD-10-CM

## 2023-09-01 DIAGNOSIS — Z79899 Other long term (current) drug therapy: Secondary | ICD-10-CM | POA: Diagnosis not present

## 2023-09-01 HISTORY — PX: IR US GUIDE VASC ACCESS RIGHT: IMG2390

## 2023-09-01 HISTORY — PX: IR ANGIOGRAM PELVIS SELECTIVE OR SUPRASELECTIVE: IMG661

## 2023-09-01 HISTORY — PX: IR ANGIOGRAM SELECTIVE EACH ADDITIONAL VESSEL: IMG667

## 2023-09-01 HISTORY — PX: IR EMBO TUMOR ORGAN ISCHEMIA INFARCT INC GUIDE ROADMAPPING: IMG5449

## 2023-09-01 LAB — CBC WITH DIFFERENTIAL/PLATELET
Abs Immature Granulocytes: 0.04 10*3/uL (ref 0.00–0.07)
Basophils Absolute: 0 10*3/uL (ref 0.0–0.1)
Basophils Relative: 0 %
Eosinophils Absolute: 0.3 10*3/uL (ref 0.0–0.5)
Eosinophils Relative: 4 %
HCT: 40.6 % (ref 36.0–46.0)
Hemoglobin: 13.3 g/dL (ref 12.0–15.0)
Immature Granulocytes: 1 %
Lymphocytes Relative: 34 %
Lymphs Abs: 3 10*3/uL (ref 0.7–4.0)
MCH: 29.8 pg (ref 26.0–34.0)
MCHC: 32.8 g/dL (ref 30.0–36.0)
MCV: 90.8 fL (ref 80.0–100.0)
Monocytes Absolute: 0.6 10*3/uL (ref 0.1–1.0)
Monocytes Relative: 7 %
Neutro Abs: 4.7 10*3/uL (ref 1.7–7.7)
Neutrophils Relative %: 54 %
Platelets: 301 10*3/uL (ref 150–400)
RBC: 4.47 MIL/uL (ref 3.87–5.11)
RDW: 13.1 % (ref 11.5–15.5)
WBC: 8.6 10*3/uL (ref 4.0–10.5)
nRBC: 0 % (ref 0.0–0.2)

## 2023-09-01 LAB — BASIC METABOLIC PANEL WITH GFR
Anion gap: 8 (ref 5–15)
BUN: 18 mg/dL (ref 6–20)
CO2: 20 mmol/L — ABNORMAL LOW (ref 22–32)
Calcium: 9.1 mg/dL (ref 8.9–10.3)
Chloride: 109 mmol/L (ref 98–111)
Creatinine, Ser: 0.68 mg/dL (ref 0.44–1.00)
GFR, Estimated: >60 mL/min (ref >60)
Glucose, Bld: 95 mg/dL (ref 70–99)
Potassium: 3.7 mmol/L (ref 3.5–5.1)
Sodium: 137 mmol/L (ref 135–145)

## 2023-09-01 LAB — PROTIME-INR
INR: 1.1 (ref 0.8–1.2)
Prothrombin Time: 14.3 s (ref 11.4–15.2)

## 2023-09-01 LAB — HCG, SERUM, QUALITATIVE: Preg, Serum: NEGATIVE

## 2023-09-01 MED ORDER — FENTANYL CITRATE (PF) 100 MCG/2ML IJ SOLN
INTRAMUSCULAR | Status: AC | PRN
  Administered 2023-09-01: 50 ug via INTRAVENOUS

## 2023-09-01 MED ORDER — PREDNISONE 20 MG PO TABS
20.0000 mg | ORAL_TABLET | Freq: Every day | ORAL | Status: AC
  Administered 2023-09-02: 20 mg via ORAL
  Filled 2023-09-01: qty 1

## 2023-09-01 MED ORDER — DIPHENHYDRAMINE HCL 12.5 MG/5ML PO ELIX
12.5000 mg | ORAL_SOLUTION | Freq: Four times a day (QID) | ORAL | Status: DC | PRN
  Administered 2023-09-01: 12.5 mg via ORAL
  Filled 2023-09-01: qty 5

## 2023-09-01 MED ORDER — SODIUM CHLORIDE 0.9 % IV SOLN
INTRAVENOUS | Status: AC | PRN
  Administered 2023-09-01: 75 mL/h via INTRAVENOUS

## 2023-09-01 MED ORDER — MIDAZOLAM HCL 2 MG/2ML IJ SOLN
INTRAMUSCULAR | Status: AC | PRN
  Administered 2023-09-01: 1 mg via INTRAVENOUS

## 2023-09-01 MED ORDER — LIDOCAINE HCL 1 % IJ SOLN
INTRAMUSCULAR | Status: AC
  Filled 2023-09-01: qty 20

## 2023-09-01 MED ORDER — DEXAMETHASONE SODIUM PHOSPHATE 10 MG/ML IJ SOLN
10.0000 mg | Freq: Once | INTRAMUSCULAR | Status: AC
  Administered 2023-09-01: 10 mg via INTRAVENOUS
  Filled 2023-09-01: qty 1

## 2023-09-01 MED ORDER — LIDOCAINE HCL 1 % IJ SOLN
20.0000 mL | Freq: Once | INTRAMUSCULAR | Status: AC
  Administered 2023-09-01: 3 mL via INTRADERMAL

## 2023-09-01 MED ORDER — CHLORHEXIDINE GLUCONATE CLOTH 2 % EX PADS: 6.0000 | MEDICATED_PAD | Freq: Every day | CUTANEOUS | Status: DC

## 2023-09-01 MED ORDER — FENTANYL CITRATE (PF) 100 MCG/2ML IJ SOLN
INTRAMUSCULAR | Status: AC
  Filled 2023-09-01: qty 6

## 2023-09-01 MED ORDER — NALOXONE HCL 0.4 MG/ML IJ SOLN: 0.4000 mg | INTRAMUSCULAR | Status: DC | PRN

## 2023-09-01 MED ORDER — HYDROMORPHONE HCL 1 MG/ML IJ SOLN
INTRAMUSCULAR | Status: AC | PRN
  Administered 2023-09-01: 1 mg via INTRAVENOUS

## 2023-09-01 MED ORDER — KETOROLAC TROMETHAMINE 30 MG/ML IJ SOLN
INTRAMUSCULAR | Status: AC
  Filled 2023-09-01: qty 1

## 2023-09-01 MED ORDER — MIDAZOLAM HCL 2 MG/2ML IJ SOLN
INTRAMUSCULAR | Status: AC | PRN
Start: 2023-09-01 — End: 2023-09-01
  Administered 2023-09-01: 1 mg via INTRAVENOUS

## 2023-09-01 MED ORDER — DIPHENHYDRAMINE HCL 50 MG/ML IJ SOLN: 12.5000 mg | Freq: Four times a day (QID) | INTRAMUSCULAR | Status: DC | PRN

## 2023-09-01 MED ORDER — DOCUSATE SODIUM 100 MG PO CAPS
100.0000 mg | ORAL_CAPSULE | Freq: Two times a day (BID) | ORAL | Status: DC
  Administered 2023-09-01: 100 mg via ORAL
  Filled 2023-09-01 (×2): qty 1

## 2023-09-01 MED ORDER — SODIUM CHLORIDE 0.9% FLUSH: 9.0000 mL | INTRAVENOUS | Status: DC | PRN

## 2023-09-01 MED ORDER — HYDROMORPHONE HCL 1 MG/ML IJ SOLN
INTRAMUSCULAR | Status: AC
  Filled 2023-09-01: qty 1

## 2023-09-01 MED ORDER — IOHEXOL 300 MG/ML  SOLN
100.0000 mL | Freq: Once | INTRAMUSCULAR | Status: AC | PRN
  Administered 2023-09-01: 80 mL via INTRAVENOUS

## 2023-09-01 MED ORDER — IOHEXOL 300 MG/ML  SOLN
100.0000 mL | Freq: Once | INTRAMUSCULAR | Status: AC | PRN
  Administered 2023-09-01: 80 mL via INTRA_ARTERIAL

## 2023-09-01 MED ORDER — KETOROLAC TROMETHAMINE 30 MG/ML IJ SOLN
30.0000 mg | Freq: Four times a day (QID) | INTRAMUSCULAR | Status: DC
  Administered 2023-09-01 – 2023-09-02 (×3): 30 mg via INTRAVENOUS
  Filled 2023-09-01 (×3): qty 1

## 2023-09-01 MED ORDER — KETOROLAC TROMETHAMINE 30 MG/ML IJ SOLN
30.0000 mg | INTRAMUSCULAR | Status: AC
  Administered 2023-09-01: 30 mg via INTRAVENOUS

## 2023-09-01 MED ORDER — IOHEXOL 300 MG/ML  SOLN: 50.0000 mL | Freq: Once | INTRAMUSCULAR | Status: DC | PRN

## 2023-09-01 MED ORDER — MIDAZOLAM HCL 2 MG/2ML IJ SOLN
INTRAMUSCULAR | Status: AC
  Filled 2023-09-01: qty 4

## 2023-09-01 MED ORDER — PROMETHAZINE HCL 25 MG RE SUPP: 25.0000 mg | Freq: Three times a day (TID) | RECTAL | Status: DC | PRN

## 2023-09-01 MED ORDER — DIPHENHYDRAMINE HCL 50 MG/ML IJ SOLN
INTRAMUSCULAR | Status: AC | PRN
  Administered 2023-09-01: 25 mg via INTRAVENOUS

## 2023-09-01 MED ORDER — CEFAZOLIN SODIUM-DEXTROSE 2-4 GM/100ML-% IV SOLN
2.0000 g | INTRAVENOUS | Status: AC
Start: 2023-09-01 — End: 2023-09-01
  Administered 2023-09-01: 2 g via INTRAVENOUS
  Filled 2023-09-01: qty 100

## 2023-09-01 MED ORDER — MIDAZOLAM HCL 2 MG/2ML IJ SOLN
INTRAMUSCULAR | Status: AC
  Filled 2023-09-01: qty 2

## 2023-09-01 MED ORDER — SODIUM CHLORIDE 0.9 % IV SOLN: INTRAVENOUS | Status: DC

## 2023-09-01 MED ORDER — IOHEXOL 300 MG/ML  SOLN: 100.0000 mL | Freq: Once | INTRAMUSCULAR | Status: DC | PRN

## 2023-09-01 MED ORDER — ONDANSETRON HCL 4 MG/2ML IJ SOLN: 4.0000 mg | Freq: Four times a day (QID) | INTRAMUSCULAR | Status: DC | PRN

## 2023-09-01 MED ORDER — SODIUM CHLORIDE 0.9% FLUSH: 3.0000 mL | Freq: Two times a day (BID) | INTRAVENOUS | Status: DC

## 2023-09-01 MED ORDER — SODIUM CHLORIDE 0.9 % IV SOLN
8.0000 mg | Freq: Once | INTRAVENOUS | Status: AC
  Administered 2023-09-01: 8 mg via INTRAVENOUS
  Filled 2023-09-01: qty 4

## 2023-09-01 MED ORDER — SODIUM CHLORIDE 0.9 % IV SOLN: 250.0000 mL | INTRAVENOUS | Status: DC | PRN

## 2023-09-01 MED ORDER — HYDROMORPHONE 1 MG/ML IV SOLN
INTRAVENOUS | Status: DC
  Administered 2023-09-01: 30 mg via INTRAVENOUS
  Filled 2023-09-01 (×11): qty 30

## 2023-09-01 MED ORDER — PROMETHAZINE HCL 25 MG PO TABS: 25.0000 mg | ORAL_TABLET | Freq: Three times a day (TID) | ORAL | Status: DC | PRN

## 2023-09-01 MED ORDER — SODIUM CHLORIDE 0.9 % IV SOLN: INTRAVENOUS | Status: AC

## 2023-09-01 MED ORDER — MEGESTROL ACETATE 40 MG PO TABS
40.0000 mg | ORAL_TABLET | Freq: Every day | ORAL | Status: DC
  Filled 2023-09-01: qty 1

## 2023-09-01 MED ORDER — SODIUM CHLORIDE 0.9% FLUSH: 3.0000 mL | INTRAVENOUS | Status: DC | PRN

## 2023-09-01 MED ORDER — ESCITALOPRAM OXALATE 10 MG PO TABS
5.0000 mg | ORAL_TABLET | Freq: Every day | ORAL | Status: DC
  Filled 2023-09-01: qty 1

## 2023-09-01 NOTE — Plan of Care (Signed)

## 2023-09-01 NOTE — Progress Notes (Signed)
 Patient ID: Joyce Gray, female   DOB: November 18, 1975, 48 y.o.   MRN: [id number] Pt doing fairly well; sitting up in bed; rates lower abd cramping between 2-4/10; some mild nausea earlier, no vomiting; has had liquids, no solids yet; foley removed- has not voided yet; vitals stable; puncture site rt CFA soft, NT, no visible or palpable hematoma; small amt blood on bandaid; plan overnight obs; on dilaudid PCA ; zofran prn nausea; f/u with Dr. Fredia Sorrow in IR clinic (televisit) in 4 weeks

## 2023-09-01 NOTE — Procedures (Signed)
 Interventional Radiology Procedure Note  Procedure: Uterine Fibroid Embolization  Complications: None  Estimated Blood Loss: 10 mL  Findings: Bilateral internal iliac and uterine arteriography. Bilateral uterine artery embolization with 500-700 micron Embospheres.  Right CFA closure: Celt device.  Plan: Overnight observation for pain control.  Jodi Marble. Fredia Sorrow, M.D Pager:  [phone]

## 2023-09-01 NOTE — Discharge Summary (Signed)
 Physician Discharge Summary      Patient ID: Joyce Gray MRN: [id number] DOB/AGE: Sep 19, 1975 48 y.o.  Admit date: 09/01/2023 Discharge date: 09/02/2023  Admission Diagnoses: Principal Problem:   Uterine leiomyoma  Discharge Diagnoses:  Principal Problem:   Uterine leiomyoma    Procedures: Bilateral internal iliac and uterine artery arteriography. Bilateral uterine artery embolization performed 09/01/23 by Dr. Nereida Banning.   Discharged Condition: Good  Hospital Course:  Patient is a 48 y/o female with anxiety, arthritis, depression, HLD, lyme disease and uterine fibroids.  Patient presented to [facility] 09/01/23 for a uterine artery embolization due to fibroids. The procedure was performed 09/01/23 with Dr. Nereida Banning. Patient did not experience immediate complications. Patient subsequently admitted for observation overnight. Patient reports feeling well today. She reports mild discomfort at the groin. She has been able to tolerate foods and liquids. Denies: chest pain, shortness of breath, nausea, vomiting, diarrhea, abdominal pain, and/or fever.    Consults:  radiology   Discharge Exam: Blood pressure 130/68, pulse (!) 54, temperature 98.2 F (36.8 C), temperature source Oral, resp. rate 14, height 5\' 9"  (1.753 m), weight 223 lb (101.2 kg), SpO2 99%. Head: Normocephalic, without obvious abnormality, atraumatic Neck: supple, symmetrical, trachea midline Resp: clear to auscultation bilaterally Cardio: regular rate and rhythm, S1, S2 normal, no murmur, click, rub or gallop Pulses: 2+ and symmetric Skin: Skin is overall warm and dry. Groin site is without hematoma formation and is soft to palpation. No surrounding erythema or edema. No evidence of bleeding. Neuro: Patient is alert and oriented.   Disposition:  Patient is a 48 y/o female with anxiety, arthritis, depression, HLD, lyme disease and uterine fibroids.  Patient is one day s/p bilateral internal iliac and  uterine artery arteriography. Bilateral uterine artery embolization performed 09/01/23 by Dr. Nereida Banning.   Patient is in stable condition and exam is without abnormal findings.  Discussed discharge instructions and medications with patient.  Patient provided return precautions. This includes, but is not limited to, signs of infection (redness, swelling, fever, fatigue, discharge from groin site), increased pain, and/or bleeding at the groin site.  Patient encouraged to contact our office with any questions/concerns.  Follow up in 4 weeks with Dr. Terrence Ferron at our outpatient facility. Our office will contact the patient to set up the appointment.     Allergies as of 09/02/2023   No Known Allergies      Medication List     STOP taking these medications    Ibuprofen 200 MG Caps Replaced by: ibuprofen 200 MG tablet       TAKE these medications    docusate sodium 100 MG capsule Commonly known as: Colace Take 1 capsule (100 mg total) by mouth 2 (two) times daily as needed for up to 7 days for mild constipation.   escitalopram 5 MG tablet Commonly known as: LEXAPRO Take 5 mg by mouth daily.   Fish Oil 1000 MG Caps Take 2,000 mg by mouth in the morning.   ibuprofen 200 MG tablet Commonly known as: Advil Take 4 tablets (800 mg total) by mouth every 6 (six) hours as needed for up to 7 days for mild pain (pain score 1-3) or moderate pain (pain score 4-6). Replaces: Ibuprofen 200 MG Caps   megestrol 40 MG tablet Commonly known as: MEGACE Take 1 tablet (40 mg total) by mouth daily.   MULTI-VITAMIN DAILY PO Take 1 tablet by mouth daily with breakfast.   NON FORMULARY Take 40 mg by mouth See admin instructions.  [employer] Heme capsules/iron 20 mg- Take 40 mg by mouth once a day with food   ondansetron 4 MG tablet Commonly known as: Zofran Take 1 tablet (4 mg total) by mouth every 8 (eight) hours as needed for up to 7 days for nausea or vomiting.    oxyCODONE-acetaminophen 5-325 MG tablet Commonly known as: Percocet Take 1 tablet by mouth every 6 (six) hours as needed for up to 5 days for severe pain (pain score 7-10).   Vitamin D3 125 MCG (5000 UT) Caps Take 5,000-10,000 Units by mouth See admin instructions. Take 5,000 units by mouth on Sun/Tues/Wed/Fri/Sat and 10,000 units on Mon/Thurs         Signed: Constancia Geeting N Zyen Triggs PA-C 09/02/2023, 9:10 AM

## 2023-09-02 DIAGNOSIS — D259 Leiomyoma of uterus, unspecified: Secondary | ICD-10-CM | POA: Diagnosis not present

## 2023-09-02 MED ORDER — IBUPROFEN 200 MG PO TABS: 800.0000 mg | ORAL_TABLET | Freq: Three times a day (TID) | ORAL | 0 refills | Status: AC | PRN

## 2023-09-02 MED ORDER — IBUPROFEN 200 MG PO TABS: 800.0000 mg | ORAL_TABLET | Freq: Four times a day (QID) | ORAL | 0 refills | Status: DC | PRN

## 2023-09-02 MED ORDER — ONDANSETRON HCL 4 MG PO TABS: 4.0000 mg | ORAL_TABLET | Freq: Three times a day (TID) | ORAL | 0 refills | Status: AC | PRN

## 2023-09-02 MED ORDER — OXYCODONE-ACETAMINOPHEN 5-325 MG PO TABS
1.0000 | ORAL_TABLET | Freq: Four times a day (QID) | ORAL | 0 refills | Status: AC | PRN
Start: 2023-09-02 — End: 2023-09-07

## 2023-09-02 MED ORDER — DOCUSATE SODIUM 100 MG PO CAPS: 100.0000 mg | ORAL_CAPSULE | Freq: Two times a day (BID) | ORAL | 0 refills | Status: AC | PRN

## 2023-09-02 NOTE — Plan of Care (Signed)

## 2023-09-02 NOTE — Discharge Instructions (Signed)
 Patient can walk as much as tolerated. No lifting over 10 pounds or strenuous exercise for at least 3 days.

## 2023-10-12 ENCOUNTER — Ambulatory Visit
Admission: RE | Admit: 2023-10-12 | Discharge: 2023-10-12 | Disposition: A | Discharge status: Home / Self Care | Source: Ambulatory Visit | Attending: Radiology | Admitting: Radiology

## 2023-10-12 DIAGNOSIS — D259 Leiomyoma of uterus, unspecified: Secondary | ICD-10-CM

## 2023-10-12 HISTORY — PX: IR RADIOLOGIST EVAL & MGMT: IMG5224

## 2023-10-12 NOTE — Progress Notes (Signed)
 Chief Complaint: Patient was consulted remotely today (TeleHealth) for follow up after uterine fibroid embolization.  History of Present Illness: Joyce Gray is a 48 y.o. female status post UFE on 09/01/23. She was discharged the next morning after overnight observation and states that she had about 5 days of some cramping pain at home and passed some tiny pieces of tissue. She has not had any vaginal discharge or fever. She has been off Megestrol  since 2 days after the procedure and was on oral contraceptives for a month, but has now been off OCP's for 2 weeks. She has had no vaginal bleeding or a menstrual cycle since the procedure. Bulk symptoms have improved with decrease in urinary frequency and better urinary flow with voiding. She is back to full activity, including regular exercise. She has had some fatigue since the procedure.  Past Medical History:  Diagnosis Date   Abnormal Pap smear of cervix 1999, 2016   Allergy    Anemia    Anxiety    Arthritis    Complication of anesthesia    felt like heart rate was a little low after d and c 01-16-2023 ( done in office)   Depression    Hyperlipidemia    Incomplete right bundle branch block    dx in 2009  stress test 2009 normal no cardiologist, repeat ekg 2013 was nsr   Lyme disease 2020   [facility] spotted fever 02/2019   Wears glasses or contacts     Past Surgical History:  Procedure Laterality Date   CRYOTHERAPY  1999   for abnormal pap smear   d & c hysteroscopy myomectomy  01/16/2023   done in office   DILATATION & CURETTAGE/HYSTEROSCOPY WITH MYOSURE N/A 02/10/2023   Procedure: DILATATION & CURETTAGE/HYSTEROSCOPY WITH MYOSURE;  Surgeon: Vernal Gold, MD;  Location: [facility];  Service: Gynecology;  Laterality: N/A;   IR ANGIOGRAM PELVIS SELECTIVE OR SUPRASELECTIVE  09/01/2023   IR ANGIOGRAM SELECTIVE EACH ADDITIONAL VESSEL  09/01/2023   IR ANGIOGRAM SELECTIVE EACH ADDITIONAL VESSEL  09/01/2023   IR  EMBO TUMOR ORGAN ISCHEMIA INFARCT INC GUIDE ROADMAPPING  09/01/2023   IR RADIOLOGIST EVAL & MGMT  07/03/2023   IR US  GUIDE VASC ACCESS RIGHT  09/01/2023   TYMPANOSTOMY TUBE PLACEMENT     1980 and 1982   WISDOM TOOTH EXTRACTION Bilateral 1996   wisdome teeth extraction  1995    Allergies: Patient has no known allergies.  Medications: Prior to Admission medications   Medication Sig Start Date End Date Taking? Authorizing Provider  Cholecalciferol (VITAMIN D3) 125 MCG (5000 UT) CAPS Take 5,000-10,000 Units by mouth See admin instructions. Take 5,000 units by mouth on Sun/Tues/Wed/Fri/Sat and 10,000 units on Mon/Thurs    [provider]  escitalopram  (LEXAPRO ) 5 MG tablet Take 5 mg by mouth daily.    [provider]  megestrol  (MEGACE ) 40 MG tablet Take 1 tablet (40 mg total) by mouth daily. 12/22/22   Rancour, Mara Seminole, MD  Multiple Vitamin (MULTI-VITAMIN DAILY PO) Take 1 tablet by mouth daily with breakfast. 05/18/21   [provider]  NON FORMULARY Take 40 mg by mouth See admin instructions. Three Arrows Iron Repair Heme capsules/iron 20 mg- Take 40 mg by mouth once a day with food    [provider]  Omega-3 Fatty Acids (FISH OIL) 1000 MG CAPS Take 2,000 mg by mouth in the morning.    [provider]     Family History  Problem Relation  Age of Onset   Hyperlipidemia Mother    Alcohol abuse Mother    Melanoma Father    Bipolar disorder Brother    Breast cancer Maternal Aunt 41   Melanoma Paternal Aunt 56   Heart disease Maternal Grandfather    Colon cancer Neg Hx    Esophageal cancer Neg Hx    Rectal cancer Neg Hx    Stomach cancer Neg Hx     Social History   Socioeconomic History   Marital status: Single    Spouse name: Not on file   Number of children: 0   Years of education: 16   Highest education level: Bachelor's degree (e.g., BA, AB, BS)  Occupational History   Occupation: Magazine features editor: [place]  Tobacco Use    Smoking status: Never    Passive exposure: Past   Smokeless tobacco: Never  Vaping Use   Vaping status: Never Used  Substance and Sexual Activity   Alcohol use: Yes    Comment: Rare   Drug use: No   Sexual activity: Not Currently    Partners: Male  Other Topics Concern   Not on file  Social History Narrative   Single. Barrister's clerk. Fun/Hobby: Hiking, reading, coffee with friends, dancing.    Social Drivers of Corporate investment banker Strain: Low Risk  (05/01/2023)   Overall Financial Resource Strain (CARDIA)    Difficulty of Paying Living Expenses: Not very hard  Food Insecurity: No Food Insecurity (09/01/2023)   Hunger Vital Sign    Worried About Running Out of Food in the Last Year: Never true    Ran Out of Food in the Last Year: Never true  Transportation Needs: No Transportation Needs (09/01/2023)   PRAPARE - Administrator, Civil Service (Medical): No    Lack of Transportation (Non-Medical): No  Physical Activity: Sufficiently Active (05/01/2023)   Exercise Vital Sign    Days of Exercise per Week: 3 days    Minutes of Exercise per Session: 60 min  Stress: No Stress Concern Present (05/01/2023)   [employer] of Occupational Health - Occupational Stress Questionnaire    Feeling of Stress : Only a little  Social Connections: Moderately Integrated (05/01/2023)   Social Connection and Isolation Panel [NHANES]    Frequency of Communication with Friends and Family: Three times a week    Frequency of Social Gatherings with Friends and Family: Once a week    Attends Religious Services: More than 4 times per year    Active Member of [employer] or Organizations: Yes    Attends Engineer, structural: More than 4 times per year    Marital Status: Never married     Review of Systems  Constitutional:  Positive for fatigue. Negative for appetite change, chills, diaphoresis and fever.  Respiratory: Negative.    Cardiovascular: Negative.    Gastrointestinal: Negative.   Genitourinary: Negative.   Musculoskeletal: Negative.   Neurological: Negative.     Review of Systems: A 12 point ROS discussed and pertinent positives are indicated in the HPI above.  All other systems are negative.    Physical Exam No direct physical exam was performed (except for noted visual exam findings with Video Visits).   Vital Signs: There were no vitals taken for this visit.  Imaging: No results found.  Labs:  CBC: Recent Labs    12/21/22 1753 12/21/22 2336 02/10/23 0822 05/01/23 1113 09/01/23 1000  WBC 15.6*  --  8.7 10.9* 8.6  HGB 11.8* 11.3* 12.7 13.9 13.3  HCT 35.7* 35.6* 39.1 41.8 40.6  PLT 370  --  309 370.0 301    COAGS: Recent Labs    12/21/22 2336 09/01/23 1000  INR 1.0 1.1    BMP: Recent Labs    11/28/22 0908 05/01/23 1113 09/01/23 1000  NA 138 139 137  K 4.7 4.2 3.7  CL 103 106 109  CO2 26 21 20*  GLUCOSE 80 79 95  BUN 10 13 18   CALCIUM 9.4 9.6 9.1  CREATININE 0.76 0.81 0.68  GFRNONAA  --   --  >60    LIVER FUNCTION TESTS: Recent Labs    11/28/22 0908 05/01/23 1113  BILITOT 0.2 0.3  AST 16 21  ALT 12 16  ALKPHOS 54 82  PROT 7.0 8.0  ALBUMIN 3.9 4.5    Assessment and Plan:  Ms. Boyland is doing very well 6 weeks post UFE and is very pleased so far with results after the procedure. She has yet to have a menstrual cycle after the procedure, which I said can be normal, as embolization can sometimes disrupt cycle regularity or even place some women in a pre-menopausal state. I recommended a follow up MRI 6-9 months after the procedure. She would like to have it done before the end of the year and I recommended having it done in either November or December. I will review the MRI with her at that time.   Electronically Signed: Aileen Alexanders 10/12/2023, 11:56 AM    I spent a total of 15 Minutes in remote  clinical consultation, greater than 50% of which was counseling/coordinating  care post uterine fibroid embolization.    Visit type: Audio and video Product/process development scientist).   Alternative for in-person consultation at [facility], [address] [place], [place], [place]. This visit type was conducted due to national recommendations for restrictions regarding the COVID-19 Pandemic (e.g. social distancing).  This format is felt to be most appropriate for this patient at this time.  All issues noted in this document were discussed and addressed.

## 2023-11-07 ENCOUNTER — Ambulatory Visit (INDEPENDENT_AMBULATORY_CARE_PROVIDER_SITE_OTHER): Admitting: Family Medicine

## 2023-11-07 ENCOUNTER — Encounter: Payer: Self-pay | Admitting: Family Medicine

## 2023-11-07 VITALS — BP 101/68 | HR 74 | Temp 97.7°F | Ht 69.0 in | Wt 228.4 lb

## 2023-11-07 DIAGNOSIS — R5383 Other fatigue: Secondary | ICD-10-CM | POA: Diagnosis not present

## 2023-11-07 DIAGNOSIS — E559 Vitamin D deficiency, unspecified: Secondary | ICD-10-CM

## 2023-11-07 DIAGNOSIS — Z1211 Encounter for screening for malignant neoplasm of colon: Secondary | ICD-10-CM | POA: Diagnosis not present

## 2023-11-07 DIAGNOSIS — E611 Iron deficiency: Secondary | ICD-10-CM

## 2023-11-07 DIAGNOSIS — F329 Major depressive disorder, single episode, unspecified: Secondary | ICD-10-CM | POA: Diagnosis not present

## 2023-11-07 LAB — COMPREHENSIVE METABOLIC PANEL WITH GFR
ALT: 44 U/L — ABNORMAL HIGH (ref 0–35)
AST: 37 U/L (ref 0–37)
Albumin: 4.1 g/dL (ref 3.5–5.2)
Alkaline Phosphatase: 82 U/L (ref 39–117)
BUN: 13 mg/dL (ref 6–23)
CO2: 25 meq/L (ref 19–32)
Calcium: 9.6 mg/dL (ref 8.4–10.5)
Chloride: 103 meq/L (ref 96–112)
Creatinine, Ser: 0.72 mg/dL (ref 0.40–1.20)
GFR: 99.77 mL/min (ref >60.00)
Glucose, Bld: 129 mg/dL — ABNORMAL HIGH (ref 70–99)
Potassium: 4.1 meq/L (ref 3.5–5.1)
Sodium: 138 meq/L (ref 135–145)
Total Bilirubin: 0.4 mg/dL (ref 0.2–1.2)
Total Protein: 7.1 g/dL (ref 6.0–8.3)

## 2023-11-07 LAB — VITAMIN B12: Vitamin B-12: 493 pg/mL (ref 211–911)

## 2023-11-07 LAB — VITAMIN D 25 HYDROXY (VIT D DEFICIENCY, FRACTURES): VITD: 60.04 ng/mL (ref 30.00–100.00)

## 2023-11-07 LAB — IBC + FERRITIN
Ferritin: 58.5 ng/mL (ref 10.0–291.0)
Iron: 111 ug/dL (ref 42–145)
Saturation Ratios: 30.6 % (ref 20.0–50.0)
TIBC: 362.6 ug/dL (ref 250.0–450.0)
Transferrin: 259 mg/dL (ref 212.0–360.0)

## 2023-11-07 LAB — CBC
HCT: 40 % (ref 36.0–46.0)
Hemoglobin: 13.4 g/dL (ref 12.0–15.0)
MCHC: 33.6 g/dL (ref 30.0–36.0)
MCV: 88.7 fl (ref 78.0–100.0)
Platelets: 326 10*3/uL (ref 150.0–400.0)
RBC: 4.51 Mil/uL (ref 3.87–5.11)
RDW: 13.4 % (ref 11.5–15.5)
WBC: 10.4 10*3/uL (ref 4.0–10.5)

## 2023-11-07 LAB — HEMOGLOBIN A1C: Hgb A1c MFr Bld: 5.4 % (ref 4.6–6.5)

## 2023-11-07 LAB — TSH: TSH: 2.41 u[IU]/mL (ref 0.35–5.50)

## 2023-11-07 NOTE — Patient Instructions (Signed)
 It was very nice to see you today!  We will check blood work today.  Please start a probiotic and fiber supplement.  I will refer you for your colonoscopy.  Let us  know if you have any change in symptoms.  Return if symptoms worsen or fail to improve.   Take care, Dr Daneil Dunker  PLEASE NOTE:  If you had any lab tests, please let us  know if you have not heard back within a few days. You may see your results on mychart before we have a chance to review them but we will give you a call once they are reviewed by us .   If we ordered any referrals today, please let us  know if you have not heard from their office within the next week.   If you had any urgent prescriptions sent in today, please check with the pharmacy within an hour of our visit to make sure the prescription was transmitted appropriately.   Please try these tips to maintain a healthy lifestyle:  Eat at least 3 REAL meals and 1-2 snacks per day.  Aim for no more than 5 hours between eating.  If you eat breakfast, please do so within one hour of getting up.   Each meal should contain half fruits/vegetables, one quarter protein, and one quarter carbs (no bigger than a computer mouse)  Cut down on sweet beverages. This includes juice, soda, and sweet tea.   Drink at least 1 glass of water with each meal and aim for at least 8 glasses per day  Exercise at least 150 minutes every week.

## 2023-11-07 NOTE — Progress Notes (Signed)
   Joyce Gray is a 48 y.o. female who presents today for an office visit.  Assessment/Plan:  New/Acute Problems: Other fatigue No red flags.  Will check labs including CBC, c-Met, TSH, B12, vitamin D , and A1c.  May be related to perimenopausal hormone changes or hormonal changes related to her recent embolization.  She can discuss this further with gynecology  Loose stool/flatulence No red flags.  Will check labs today as above.  Recommended probiotic and fiber supplementation.  She will let us  know if not improving would consider stool studies versus referral to GI at that time.  Chronic Problems Addressed Today: Depression Also psychiatry on this.  Mood is well-controlled on Lexapro  5 mg daily-do not think that this is contributing to her fatigue.  Vitamin D  deficiency Check vitamin D .  Iron deficiency Check iron panel.     Subjective:  HPI:  See Assessment / plan for status of chronic conditions.  Patient is here today to discuss fatigue.  She is underwent uterine fibroid embolization a couple of months ago.  Since then has had persistent tiredness and fatigue.  She discussed this with interventional radiology who recommended she come in here to get blood work done today.  She has also noticed increased flatulence and loose stools with occasional diarrhea.  No nausea or vomiting.  No bloating.  No abdominal pain.  She tried taking Gas-X for this without much improvement.  She also has had a few hot flashes and night sweats.  Interventional radiologist told her that this may be due to change in her hormone levels from her recent procedure.  She will discuss this with her gynecologist at her upcoming appointment with them.         Objective:  Physical Exam: BP 101/68   Pulse 74   Temp 97.7 F (36.5 C) (Temporal)   Ht 5' 9 (1.753 m)   Wt 228 lb 6.4 oz (103.6 kg)   LMP 10/23/2023   SpO2 98%   BMI 33.73 kg/m   Wt Readings from Last 3 Encounters:  11/07/23 228 lb 6.4  oz (103.6 kg)  09/01/23 223 lb (101.2 kg)  05/01/23 226 lb 3.2 oz (102.6 kg)    Gen: No acute distress, resting comfortably CV: Regular rate and rhythm with no murmurs appreciated Pulm: Normal work of breathing, clear to auscultation bilaterally with no crackles, wheezes, or rhonchi Neuro: Grossly normal, moves all extremities Psych: Normal affect and thought content      Joyce Rena M. Daneil Dunker, MD 11/07/2023 12:18 PM

## 2023-11-07 NOTE — Assessment & Plan Note (Signed)
 Check vitamin D.

## 2023-11-07 NOTE — Assessment & Plan Note (Signed)
 Check iron panel

## 2023-11-07 NOTE — Assessment & Plan Note (Signed)
 Also psychiatry on this.  Mood is well-controlled on Lexapro  5 mg daily-do not think that this is contributing to her fatigue.

## 2023-11-08 ENCOUNTER — Ambulatory Visit: Payer: Self-pay | Admitting: Family Medicine

## 2023-11-08 DIAGNOSIS — R748 Abnormal levels of other serum enzymes: Secondary | ICD-10-CM

## 2023-11-08 NOTE — Progress Notes (Signed)
 One of her liver numbers was mildly elevated but the rest of her labs were negative.  No obvious explanation for her fatigue based on these labs.  May be related to hormonal changes due to her recent procedure.  Recommend she discuss this further with gynecology.  Recommend she come back in a few weeks to recheck her liver numbers.  Please place future order for CMET.

## 2023-11-22 ENCOUNTER — Telehealth: Payer: Self-pay

## 2023-11-22 NOTE — Telephone Encounter (Signed)
 I called and spoke with pt, she is aware.

## 2023-11-22 NOTE — Telephone Encounter (Signed)
 Copied from CRM [id number]. Topic: Clinical - Request for Lab/Test Order >> Nov 21, 2023 11:46 AM Ernestene P wrote: Reason for CRM: Pt advise obgyn request to have [facility]) lab to test for menopause along with blood work for physical- pt advise if that could not be done, give her a call [phone]

## 2023-11-22 NOTE — Telephone Encounter (Unsigned)
 Copied from CRM [id number]. Topic: Clinical - Medical Advice >> Nov 21, 2023  3:18 PM Shereese L wrote: Reason for CRM: patient would like a copper test ran as well when labs are done

## 2023-11-22 NOTE — Telephone Encounter (Signed)
 Noted.  We can discuss more at her upcoming visit.

## 2023-11-29 ENCOUNTER — Ambulatory Visit (INDEPENDENT_AMBULATORY_CARE_PROVIDER_SITE_OTHER): Payer: BC Managed Care – PPO | Admitting: Family Medicine

## 2023-11-29 VITALS — BP 100/72 | HR 57 | Temp 97.7°F | Ht 69.29 in | Wt 229.4 lb

## 2023-11-29 DIAGNOSIS — E66811 Obesity, class 1: Secondary | ICD-10-CM

## 2023-11-29 DIAGNOSIS — E6609 Other obesity due to excess calories: Secondary | ICD-10-CM

## 2023-11-29 DIAGNOSIS — R7401 Elevation of levels of liver transaminase levels: Secondary | ICD-10-CM | POA: Diagnosis not present

## 2023-11-29 DIAGNOSIS — E785 Hyperlipidemia, unspecified: Secondary | ICD-10-CM | POA: Diagnosis not present

## 2023-11-29 DIAGNOSIS — Z0001 Encounter for general adult medical examination with abnormal findings: Secondary | ICD-10-CM

## 2023-11-29 DIAGNOSIS — F329 Major depressive disorder, single episode, unspecified: Secondary | ICD-10-CM | POA: Diagnosis not present

## 2023-11-29 DIAGNOSIS — Z6834 Body mass index (BMI) 34.0-34.9, adult: Secondary | ICD-10-CM

## 2023-11-29 DIAGNOSIS — F419 Anxiety disorder, unspecified: Secondary | ICD-10-CM

## 2023-11-29 DIAGNOSIS — R5383 Other fatigue: Secondary | ICD-10-CM

## 2023-11-29 LAB — LIPID PANEL
Cholesterol: 205 mg/dL — ABNORMAL HIGH (ref 0–200)
HDL: 45.9 mg/dL (ref >39.00)
LDL Cholesterol: 120 mg/dL — ABNORMAL HIGH (ref 0–99)
NonHDL: 158.68
Total CHOL/HDL Ratio: 4
Triglycerides: 191 mg/dL — ABNORMAL HIGH (ref 0.0–149.0)
VLDL: 38.2 mg/dL (ref 0.0–40.0)

## 2023-11-29 LAB — COMPREHENSIVE METABOLIC PANEL WITH GFR
ALT: 21 U/L (ref 0–35)
AST: 25 U/L (ref 0–37)
Albumin: 4.4 g/dL (ref 3.5–5.2)
Alkaline Phosphatase: 89 U/L (ref 39–117)
BUN: 14 mg/dL (ref 6–23)
CO2: 30 meq/L (ref 19–32)
Calcium: 9.8 mg/dL (ref 8.4–10.5)
Chloride: 101 meq/L (ref 96–112)
Creatinine, Ser: 0.72 mg/dL (ref 0.40–1.20)
GFR: 99.72 mL/min (ref >60.00)
Glucose, Bld: 84 mg/dL (ref 70–99)
Potassium: 4.1 meq/L (ref 3.5–5.1)
Sodium: 137 meq/L (ref 135–145)
Total Bilirubin: 0.4 mg/dL (ref 0.2–1.2)
Total Protein: 7.7 g/dL (ref 6.0–8.3)

## 2023-11-29 LAB — FOLLICLE STIMULATING HORMONE: FSH: 4.1 m[IU]/mL

## 2023-11-29 NOTE — Progress Notes (Signed)
 Chief Complaint:  Joyce Gray is a 48 y.o. female who presents today for her annual comprehensive physical exam.    Assessment/Plan:  New/Acute Problems: Elevated ALT Patient with mild elevation of ALT a few weeks ago.  Will recheck today.  Will also check copper and ceruloplasmin today per patient request I asked that she was found to have mildly elevated copper a few years ago with an integrative care practice.  Discussed with patient that Wilson's disease is rare and unlikely however would not hurt to check labs today to rule this out.  Depending on results of above may need referral to GI.  Other Fatigue  Symptoms are about the same.  Will check [facility] today per patient request.  Chronic Problems Addressed Today: Depression overall symptoms are stable.  She is interested in weaning off Lexapro  as she does not feel like she needs it any longer.  Advised her to discuss this with her psychiatrist though did discuss decreasing dose to half a dose for a few weeks and then half a dose every other day before fully taking off.  Obesity She is working on weight loss and has been scheduled with medical weight management.  Anxiety See depression A/P.  She is wanting to wean off Lexapro .  She will follow-up with her psychiatrist.  Dyslipidemia Check lipids today.  Discussed lifestyle modification.   Preventative Healthcare: Follows with gynecology for women's health.  Up-to-date on Pap.  She was referred to GI for colon cancer screening and will call to schedule appointment soon.  Patient Counseling(The following topics were reviewed and/or handout was given):  -Nutrition: Stressed importance of moderation in sodium/caffeine intake, saturated fat and cholesterol, caloric balance, sufficient intake of fresh fruits, vegetables, and fiber.  -Stressed the importance of regular exercise.   -Substance Abuse: Discussed cessation/primary prevention of tobacco, alcohol, or other drug use; driving  or other dangerous activities under the influence; availability of treatment for abuse.   -Injury prevention: Discussed safety belts, safety helmets, smoke detector, smoking near bedding or upholstery.   -Sexuality: Discussed sexually transmitted diseases, partner selection, use of condoms, avoidance of unintended pregnancy and contraceptive alternatives.   -Dental health: Discussed importance of regular tooth brushing, flossing, and dental visits.  -Health maintenance and immunizations reviewed. Please refer to Health maintenance section.  Return to care in 1 year for next preventative visit.     Subjective:  HPI:  See A/P for status of chronic conditions.  Patient is here today for her annual physical.  I last saw her about 3 weeks ago.  At that time we checked labs for fatigue labs.  Labs were notable for mildly elevated ALT however nothing else was normal.  She has discussed her fatigue with her gynecologist and they recommended that she have FSH checked today to evaluate for menopause.  She would also like to have copper test current as well.  She was seen in integrative care in the past and was told that she had mildly elevated copper levels.  She is concerned about possible Wilson's disease.  She was recently treated for a upper respiratory infection (URI) with Augmentin . Her symptoms are improving.   Lifestyle Diet: Working on cutting down on portion sizes.  Exercise: goes to gym several times per week. Working on cardio and weight training.      11/29/2023    9:11 AM  Depression screen PHQ 2/9  Decreased Interest 0  Down, Depressed, Hopeless 0  PHQ - 2 Score 0  Altered sleeping  0  Tired, decreased energy 0  Change in appetite 0  Feeling bad or failure about yourself  0  Trouble concentrating 0  Moving slowly or fidgety/restless 0  Suicidal thoughts 0  PHQ-9 Score 0  Difficult doing work/chores Not difficult at all    There are no preventive care reminders to display for  this patient.    ROS: Per HPI, otherwise a complete review of systems was negative.   PMH:  The following were reviewed and entered/updated in epic: Past Medical History:  Diagnosis Date   Abnormal Pap smear of cervix 1999, 2016   Allergy    Anemia    Anxiety    Arthritis    Complication of anesthesia    felt like heart rate was a little low after d and c 01-16-2023 ( done in office)   Depression    Hyperlipidemia    Incomplete right bundle branch block    dx in 2009  stress test 2009 normal no cardiologist, repeat ekg 2013 was nsr   Lyme disease 2020   [facility] spotted fever 02/2019   Wears glasses or contacts    Patient Active Problem List   Diagnosis Date Noted   Uterine leiomyoma 09/01/2023   Dysmenorrhea 05/01/2023   Iron deficiency 05/01/2023   Allergic rhinitis 11/22/2021   Vitamin D  deficiency 08/31/2020   Right bundle branch block (RBBB) 06/11/2019   [facility] spotted fever 02/09/2019   Dyslipidemia 10/03/2018   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   Anxiety 07/11/2018   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   Obesity    Depression    Past Surgical History:  Procedure Laterality Date   CRYOTHERAPY  1999   for abnormal pap smear   d & c hysteroscopy myomectomy  01/16/2023   done in office   DILATATION & CURETTAGE/HYSTEROSCOPY WITH MYOSURE N/A 02/10/2023   Procedure: DILATATION & CURETTAGE/HYSTEROSCOPY WITH MYOSURE;  Surgeon: Gloriann Chick, MD;  Location: [facility];  Service: Gynecology;  Laterality: N/A;   IR ANGIOGRAM PELVIS SELECTIVE OR SUPRASELECTIVE  09/01/2023   IR ANGIOGRAM SELECTIVE EACH ADDITIONAL VESSEL  09/01/2023   IR ANGIOGRAM SELECTIVE EACH ADDITIONAL VESSEL  09/01/2023   IR EMBO TUMOR ORGAN ISCHEMIA INFARCT INC GUIDE ROADMAPPING  09/01/2023   IR RADIOLOGIST EVAL & MGMT  07/03/2023   IR RADIOLOGIST EVAL & MGMT  10/12/2023   IR US  GUIDE VASC ACCESS RIGHT  09/01/2023   TYMPANOSTOMY TUBE PLACEMENT     1980 and 1982    WISDOM TOOTH EXTRACTION Bilateral 1996   wisdome teeth extraction  1995    Family History  Problem Relation Age of Onset   Hyperlipidemia Mother    Alcohol abuse Mother    Melanoma Father    Bipolar disorder Brother    Breast cancer Maternal Aunt 39   Melanoma Paternal Aunt 45   Heart disease Maternal Grandfather    Colon cancer Neg Hx    Esophageal cancer Neg Hx    Rectal cancer Neg Hx    Stomach cancer Neg Hx     Medications- reviewed and updated Current Outpatient Medications  Medication Sig Dispense Refill   amoxicillin -clavulanate (AUGMENTIN ) 500-125 MG tablet Take 1 tablet by mouth 2 (two) times daily.     Cholecalciferol (VITAMIN D3) 125 MCG (5000 UT) CAPS Take 5,000-10,000 Units by mouth See admin instructions. Take 5,000 units by mouth on Sun/Tues/Wed/Fri/Sat and 10,000 units on Mon/Thurs     escitalopram  (LEXAPRO ) 5 MG tablet Take 5 mg by  mouth daily.     Multiple Vitamin (MULTI-VITAMIN DAILY PO) Take 1 tablet by mouth daily with breakfast.     Omega-3 Fatty Acids (FISH OIL) 1000 MG CAPS Take 2,000 mg by mouth in the morning.     No current facility-administered medications for this visit.    Allergies-reviewed and updated No Known Allergies  Social History   Socioeconomic History   Marital status: Single    Spouse name: Not on file   Number of children: 0   Years of education: 16   Highest education level: Bachelor's degree (e.g., BA, AB, BS)  Occupational History   Occupation: Magazine features editor: [place]  Tobacco Use   Smoking status: Never    Passive exposure: Past   Smokeless tobacco: Never  Vaping Use   Vaping status: Never Used  Substance and Sexual Activity   Alcohol use: Yes    Comment: Rare   Drug use: No   Sexual activity: Not Currently    Partners: Male  Other Topics Concern   Not on file  Social History Narrative   Single. Barrister's clerk. Fun/Hobby: Hiking, reading, coffee with friends, dancing.    Social Drivers of  Corporate investment banker Strain: Low Risk  (05/01/2023)   Overall Financial Resource Strain (CARDIA)    Difficulty of Paying Living Expenses: Not very hard  Food Insecurity: No Food Insecurity (09/01/2023)   Hunger Vital Sign    Worried About Running Out of Food in the Last Year: Never true    Ran Out of Food in the Last Year: Never true  Transportation Needs: No Transportation Needs (09/01/2023)   PRAPARE - Administrator, Civil Service (Medical): No    Lack of Transportation (Non-Medical): No  Physical Activity: Sufficiently Active (05/01/2023)   Exercise Vital Sign    Days of Exercise per Week: 3 days    Minutes of Exercise per Session: 60 min  Stress: No Stress Concern Present (05/01/2023)   [employer] of Occupational Health - Occupational Stress Questionnaire    Feeling of Stress : Only a little  Social Connections: Moderately Integrated (05/01/2023)   Social Connection and Isolation Panel    Frequency of Communication with Friends and Family: Three times a week    Frequency of Social Gatherings with Friends and Family: Once a week    Attends Religious Services: More than 4 times per year    Active Member of [employer] or Organizations: Yes    Attends Engineer, structural: More than 4 times per year    Marital Status: Never married        Objective:  Physical Exam: BP 100/72 (BP Location: Right Arm, Patient Position: Sitting, Cuff Size: Normal)   Pulse (!) 57   Temp 97.7 F (36.5 C) (Temporal)   Ht 5' 9.29 (1.76 m)   Wt 229 lb 6.4 oz (104.1 kg)   LMP 10/23/2023   SpO2 98%   BMI 33.59 kg/m   Body mass index is 33.59 kg/m. Wt Readings from Last 3 Encounters:  11/29/23 229 lb 6.4 oz (104.1 kg)  11/07/23 228 lb 6.4 oz (103.6 kg)  09/01/23 223 lb (101.2 kg)   Gen: NAD, resting comfortably HEENT: TMs normal bilaterally. OP clear. No thyromegaly noted.  CV: RRR with no murmurs appreciated Pulm: NWOB, CTAB with no crackles, wheezes, or  rhonchi GI: Normal bowel sounds present. Soft, Nontender, Nondistended. MSK: no edema, cyanosis, or clubbing noted Skin: warm, dry Neuro: CN2-12 grossly intact.  Strength 5/5 in upper and lower extremities. Reflexes symmetric and intact bilaterally.  Psych: Normal affect and thought content     Jazelyn Sipe M. Kennyth, MD 11/29/2023 9:35 AM

## 2023-11-29 NOTE — Assessment & Plan Note (Signed)
Check lipids today.  Discussed lifestyle modification.

## 2023-11-29 NOTE — Assessment & Plan Note (Signed)
 overall symptoms are stable.  She is interested in weaning off Lexapro  as she does not feel like she needs it any longer.  Advised her to discuss this with her psychiatrist though did discuss decreasing dose to half a dose for a few weeks and then half a dose every other day before fully taking off.

## 2023-11-29 NOTE — Assessment & Plan Note (Signed)
 She is working on weight loss and has been scheduled with medical weight management.

## 2023-11-29 NOTE — Assessment & Plan Note (Signed)
 See depression A/P.  She is wanting to wean off Lexapro .  She will follow-up with her psychiatrist.

## 2023-11-29 NOTE — Patient Instructions (Signed)
 It was very nice to see you today!  We will check blood work today.  Please continue to work on diet and exercise.  Please call [phone] to schedule your colonoscopy.   I will see back in year for your next physical.  Come back sooner if needed.  Return in about 1 year (around 11/28/2024) for Annual Physical.   Take care, Dr Kennyth  PLEASE NOTE:  If you had any lab tests, please let us  know if you have not heard back within a few days. You may see your results on mychart before we have a chance to review them but we will give you a call once they are reviewed by us .   If we ordered any referrals today, please let us  know if you have not heard from their office within the next week.   If you had any urgent prescriptions sent in today, please check with the pharmacy within an hour of our visit to make sure the prescription was transmitted appropriately.   Please try these tips to maintain a healthy lifestyle:  Eat at least 3 REAL meals and 1-2 snacks per day.  Aim for no more than 5 hours between eating.  If you eat breakfast, please do so within one hour of getting up.   Each meal should contain half fruits/vegetables, one quarter protein, and one quarter carbs (no bigger than a computer mouse)  Cut down on sweet beverages. This includes juice, soda, and sweet tea.   Drink at least 1 glass of water with each meal and aim for at least 8 glasses per day  Exercise at least 150 minutes every week.    Preventive Care 7-84 Years Old, Female Preventive care refers to lifestyle choices and visits with your health care provider that can promote health and wellness. Preventive care visits are also called wellness exams. What can I expect for my preventive care visit? Counseling Your health care provider may ask you questions about your: Medical history, including: Past medical problems. Family medical history. Pregnancy history. Current health, including: Menstrual cycle. Method  of birth control. Emotional well-being. Home life and relationship well-being. Sexual activity and sexual health. Lifestyle, including: Alcohol, nicotine or tobacco, and drug use. Access to firearms. Diet, exercise, and sleep habits. Work and work Astronomer. Sunscreen use. Safety issues such as seatbelt and bike helmet use. Physical exam Your health care provider will check your: Height and weight. These may be used to calculate your BMI (body mass index). BMI is a measurement that tells if you are at a healthy weight. Waist circumference. This measures the distance around your waistline. This measurement also tells if you are at a healthy weight and may help predict your risk of certain diseases, such as type 2 diabetes and high blood pressure. Heart rate and blood pressure. Body temperature. Skin for abnormal spots. What immunizations do I need?  Vaccines are usually given at various ages, according to a schedule. Your health care provider will recommend vaccines for you based on your age, medical history, and lifestyle or other factors, such as travel or where you work. What tests do I need? Screening Your health care provider may recommend screening tests for certain conditions. This may include: Lipid and cholesterol levels. Diabetes screening. This is done by checking your blood sugar (glucose) after you have not eaten for a while (fasting). Pelvic exam and Pap test. Hepatitis B test. Hepatitis C test. HIV (human immunodeficiency virus) test. STI (sexually transmitted infection) testing, if you  are at risk. Lung cancer screening. Colorectal cancer screening. Mammogram. Talk with your health care provider about when you should start having regular mammograms. This may depend on whether you have a family history of breast cancer. BRCA-related cancer screening. This may be done if you have a family history of breast, ovarian, tubal, or peritoneal cancers. Bone density scan.  This is done to screen for osteoporosis. Talk with your health care provider about your test results, treatment options, and if necessary, the need for more tests. Follow these instructions at home: Eating and drinking  Eat a diet that includes fresh fruits and vegetables, whole grains, lean protein, and low-fat dairy products. Take vitamin and mineral supplements as recommended by your health care provider. Do not drink alcohol if: Your health care provider tells you not to drink. You are pregnant, may be pregnant, or are planning to become pregnant. If you drink alcohol: Limit how much you have to 0-1 drink a day. Know how much alcohol is in your drink. In the [place] one drink equals one 12 oz bottle of beer (355 mL), one 5 oz glass of wine (148 mL), or one 1 oz glass of hard liquor (44 mL). Lifestyle Brush your teeth every morning and night with fluoride toothpaste. Floss one time each day. Exercise for at least 30 minutes 5 or more days each week. Do not use any products that contain nicotine or tobacco. These products include cigarettes, chewing tobacco, and vaping devices, such as e-cigarettes. If you need help quitting, ask your health care provider. Do not use drugs. If you are sexually active, practice safe sex. Use a condom or other form of protection to prevent STIs. If you do not wish to become pregnant, use a form of birth control. If you plan to become pregnant, see your health care provider for a prepregnancy visit. Take aspirin only as told by your health care provider. Make sure that you understand how much to take and what form to take. Work with your health care provider to find out whether it is safe and beneficial for you to take aspirin daily. Find healthy ways to manage stress, such as: Meditation, yoga, or listening to music. Journaling. Talking to a trusted person. Spending time with friends and family. Minimize exposure to UV radiation to reduce your risk of skin  cancer. Safety Always wear your seat belt while driving or riding in a vehicle. Do not drive: If you have been drinking alcohol. Do not ride with someone who has been drinking. When you are tired or distracted. While texting. If you have been using any mind-altering substances or drugs. Wear a helmet and other protective equipment during sports activities. If you have firearms in your house, make sure you follow all gun safety procedures. Seek help if you have been physically or sexually abused. What's next? Visit your health care provider once a year for an annual wellness visit. Ask your health care provider how often you should have your eyes and teeth checked. Stay up to date on all vaccines. This information is not intended to replace advice given to you by your health care provider. Make sure you discuss any questions you have with your health care provider. Document Revised: 11/04/2020 Document Reviewed: 11/04/2020 Elsevier Patient Education  2024 [employer].

## 2023-12-05 LAB — COPPER, SERUM: Copper: 163 ug/dL (ref 70–175)

## 2023-12-05 LAB — CERULOPLASMIN: Ceruloplasmin: 40 mg/dL (ref 14–48)

## 2023-12-07 ENCOUNTER — Ambulatory Visit: Payer: Self-pay | Admitting: Family Medicine

## 2023-12-07 NOTE — Progress Notes (Signed)
 Her cholesterol is mildly elevated however not at the point where she needs to start meds.  It is important she should continue to work on diet and exercise and we can recheck this again in a year.  Her copper  levels are normal.  Her liver tests are back to normal.  Her FSH level is normal.  We do not need to do any other testing at this point.  We can recheck everything in a year.

## 2023-12-19 ENCOUNTER — Ambulatory Visit (AMBULATORY_SURGERY_CENTER)

## 2023-12-19 ENCOUNTER — Encounter: Payer: Self-pay | Admitting: Pediatrics

## 2023-12-19 VITALS — Ht 69.0 in | Wt 228.0 lb

## 2023-12-19 DIAGNOSIS — Z1211 Encounter for screening for malignant neoplasm of colon: Secondary | ICD-10-CM

## 2023-12-19 MED ORDER — NA SULFATE-K SULFATE-MG SULF 17.5-3.13-1.6 GM/177ML PO SOLN
1.0000 | Freq: Once | ORAL | 0 refills | Status: AC
Start: 2023-12-19 — End: 2023-12-19

## 2023-12-19 NOTE — Progress Notes (Signed)
 Pre visit completed via phone call; Patient verified name, DOB, and address; No egg or soy allergy known to patient;  No issues known to pt with past sedation with any surgeries or procedures; Patient denies ever being told they had issues or difficulty with intubation; No FH of Malignant Hyperthermia; Pt is not on diet pills; Pt is not on home 02;  Pt is not on blood thinners;  Pt denies issues with constipation;  No A fib or A flutter; Have any cardiac testing pending--NO Insurance verified during PV appt--- [facility] Pt can ambulate without assistance;  Pt denies use of chewing tobacco; Discussed diabetic/weight loss medication holds; Discussed NSAID holds; Checked BMI to be less than 50; Pt instructed to use Singlecare.com or GoodRx for a price reduction on prep;  Patient's chart reviewed by Norleen Schillings CNRA prior to previsit and patient appropriate for the [facility];  Pre visit completed and red dot placed by patient's name on their procedure day (on provider's schedule);    Instructions sent to patient via MyChart;

## 2023-12-23 ENCOUNTER — Encounter: Payer: Self-pay | Admitting: Pediatrics

## 2023-12-31 NOTE — Progress Notes (Signed)
 [facility] History and Physical   Primary Care Physician:  Kennyth Worth HERO, MD   Reason for Procedure:  Colorectal cancer screening  Plan:    Screening colonoscopy     HPI: Joyce Gray is a 48 y.o. female undergoing screening colonoscopy for colorectal cancer screening.  This is the patient's first colonoscopy.  No family history of colorectal cancer; Father with polyps.  Patient denies current symptoms of change in bowel habits or rectal bleeding.   Past Medical History:  Diagnosis Date   Abnormal Pap smear of cervix 1999, 2016   Allergy    Anemia    Anxiety    Arthritis    Complication of anesthesia    felt like heart rate was a little low after d and c 01-16-2023 ( done in office)   Depression    Hyperlipidemia    Incomplete right bundle branch block    dx in 2009  stress test 2009 normal no cardiologist, repeat ekg 2013 was nsr   Lyme disease 2020   Obesity    pt continues to work to decrease weight   [facility] spotted fever 02/2019   Wears glasses or contacts     Past Surgical History:  Procedure Laterality Date   CRYOTHERAPY  1999   for abnormal pap smear   d & c hysteroscopy myomectomy  01/16/2023   done in office   DILATATION & CURETTAGE/HYSTEROSCOPY WITH MYOSURE N/A 02/10/2023   Procedure: DILATATION & CURETTAGE/HYSTEROSCOPY WITH MYOSURE;  Surgeon: Gloriann Chick, MD;  Location: [facility];  Service: Gynecology;  Laterality: N/A;   IR ANGIOGRAM PELVIS SELECTIVE OR SUPRASELECTIVE  09/01/2023   IR ANGIOGRAM SELECTIVE EACH ADDITIONAL VESSEL  09/01/2023   IR ANGIOGRAM SELECTIVE EACH ADDITIONAL VESSEL  09/01/2023   IR EMBO TUMOR ORGAN ISCHEMIA INFARCT INC GUIDE ROADMAPPING  09/01/2023   IR RADIOLOGIST EVAL & MGMT  07/03/2023   IR RADIOLOGIST EVAL & MGMT  10/12/2023   IR US  GUIDE VASC ACCESS RIGHT  09/01/2023   TYMPANOSTOMY TUBE PLACEMENT     1980 and 1982   WISDOM TOOTH EXTRACTION Bilateral 1996    Prior to Admission  medications   Medication Sig Start Date End Date Taking? Authorizing Provider  Cholecalciferol (VITAMIN D3) 125 MCG (5000 UT) CAPS Take 5,000-10,000 Units by mouth See admin instructions. Take 5,000 units by mouth on Sun/Tues/Wed/Fri/Sat and 10,000 units on Mon/Thurs    [provider]  escitalopram  (LEXAPRO ) 5 MG tablet Take 5 mg by mouth daily.    [provider]  Multiple Vitamin (MULTI-VITAMIN DAILY PO) Take 1 tablet by mouth daily with breakfast. 05/18/21   [provider]  Omega-3 Fatty Acids (FISH OIL) 1000 MG CAPS Take 2,000 mg by mouth in the morning.    [provider]  Probiotic Product (PROBIOTIC PO) Take 1 capsule by mouth daily at 6 (six) AM.    [provider]    Current Outpatient Medications  Medication Sig Dispense Refill   Cholecalciferol (VITAMIN D3) 125 MCG (5000 UT) CAPS Take 5,000-10,000 Units by mouth See admin instructions. Take 5,000 units by mouth on Sun/Tues/Wed/Fri/Sat and 10,000 units on Mon/Thurs     escitalopram  (LEXAPRO ) 5 MG tablet Take 5 mg by mouth daily.     Multiple Vitamin (MULTI-VITAMIN DAILY PO) Take 1 tablet by mouth daily with breakfast.     Omega-3 Fatty Acids (FISH OIL) 1000 MG CAPS Take 2,000 mg by mouth in the morning.     Probiotic Product (PROBIOTIC PO) Take  1 capsule by mouth daily at 6 (six) AM.     Current Facility-Administered Medications  Medication Dose Route Frequency Provider Last Rate Last Admin   0.9 %  sodium chloride  infusion  500 mL Intravenous Once Adamarie Izzo, Inocente HERO, MD        Allergies as of 01/01/2024   (No Known Allergies)    Family History  Problem Relation Age of Onset   Hyperlipidemia Mother    Alcohol abuse Mother    Melanoma Father    Bipolar disorder Brother    Breast cancer Maternal Aunt 71   Melanoma Paternal Aunt 45   Heart disease Maternal Grandfather    Colon cancer Neg Hx    Esophageal cancer Neg Hx    Rectal cancer Neg Hx    Stomach cancer Neg Hx    Colon  polyps Neg Hx     Social History   Socioeconomic History   Marital status: Single    Spouse name: Not on file   Number of children: 0   Years of education: 16   Highest education level: Bachelor's degree (e.g., BA, AB, BS)  Occupational History   Occupation: Magazine features editor: [place]  Tobacco Use   Smoking status: Never    Passive exposure: Past   Smokeless tobacco: Never  Vaping Use   Vaping status: Never Used  Substance and Sexual Activity   Alcohol use: Yes    Comment: speical occasions   Drug use: No   Sexual activity: Not Currently    Partners: Male  Other Topics Concern   Not on file  Social History Narrative   Single. Barrister's clerk. Fun/Hobby: Hiking, reading, coffee with friends, dancing.    Social Drivers of Corporate investment banker Strain: Low Risk  (05/01/2023)   Overall Financial Resource Strain (CARDIA)    Difficulty of Paying Living Expenses: Not very hard  Food Insecurity: No Food Insecurity (09/01/2023)   Hunger Vital Sign    Worried About Running Out of Food in the Last Year: Never true    Ran Out of Food in the Last Year: Never true  Transportation Needs: No Transportation Needs (09/01/2023)   PRAPARE - Administrator, Civil Service (Medical): No    Lack of Transportation (Non-Medical): No  Physical Activity: Sufficiently Active (05/01/2023)   Exercise Vital Sign    Days of Exercise per Week: 3 days    Minutes of Exercise per Session: 60 min  Stress: No Stress Concern Present (05/01/2023)   [employer] of Occupational Health - Occupational Stress Questionnaire    Feeling of Stress : Only a little  Social Connections: Moderately Integrated (05/01/2023)   Social Connection and Isolation Panel    Frequency of Communication with Friends and Family: Three times a week    Frequency of Social Gatherings with Friends and Family: Once a week    Attends Religious Services: More than 4 times per year    Active Member of [employer]  or Organizations: Yes    Attends Engineer, structural: More than 4 times per year    Marital Status: Never married  Intimate Partner Violence: Not At Risk (09/01/2023)   Humiliation, Afraid, Rape, and Kick questionnaire    Fear of Current or Ex-Partner: No    Emotionally Abused: No    Physically Abused: No    Sexually Abused: No    Review of Systems:  All other review of systems negative except as mentioned in the HPI.  Physical Exam: Vital signs BP (!) 166/94   Pulse 60   Temp 97.9 F (36.6 C)   Resp 16   Ht 5' 9 (1.753 m)   Wt 223 lb (101.2 kg)   SpO2 100%   BMI 32.93 kg/m   General:   Alert,  Well-developed, well-nourished, pleasant and cooperative in NAD Airway:  Mallampati 1 Lungs:  Clear throughout to auscultation.   Heart:  Regular rate and rhythm; no murmurs, clicks, rubs,  or gallops. Abdomen:  Soft, nontender and nondistended. Normal bowel sounds.   Neuro/Psych:  Normal mood and affect. A and O x 3  Inocente Hausen, MD [facility]

## 2024-01-01 ENCOUNTER — Ambulatory Visit (AMBULATORY_SURGERY_CENTER): Admitting: Pediatrics

## 2024-01-01 ENCOUNTER — Encounter: Payer: Self-pay | Admitting: Pediatrics

## 2024-01-01 VITALS — BP 118/78 | HR 52 | Temp 97.9°F | Resp 11 | Ht 69.0 in | Wt 223.0 lb

## 2024-01-01 DIAGNOSIS — Z1211 Encounter for screening for malignant neoplasm of colon: Secondary | ICD-10-CM

## 2024-01-01 DIAGNOSIS — Z83719 Family history of colon polyps, unspecified: Secondary | ICD-10-CM

## 2024-01-01 MED ORDER — SODIUM CHLORIDE 0.9 % IV SOLN: 500.0000 mL | Freq: Once | INTRAVENOUS | Status: DC

## 2024-01-01 NOTE — Patient Instructions (Signed)
Resume previous diet Continue present medications There were no colon polyps seen today!   You will need another screening colonoscopy in 10 years, you will receive a letter at that time when you are due for the procedure.  Please call us at [phone] if you have a change in bowel habits, change in family history of colo-rectal cancer, rectal bleeding or other GI concern before that time.  YOU HAD AN ENDOSCOPIC PROCEDURE TODAY AT THE  ENDOSCOPY CENTER:   Refer to the procedure report that was given to you for any specific questions about what was found during the examination.  If the procedure report does not answer your questions, please call your gastroenterologist to clarify.  If you requested that your care partner not be given the details of your procedure findings, then the procedure report has been included in a sealed envelope for you to review at your convenience later.  YOU SHOULD EXPECT: Some feelings of bloating in the abdomen. Passage of more gas than usual.  Walking can help get rid of the air that was put into your GI tract during the procedure and reduce the bloating. If you had a lower endoscopy (such as a colonoscopy or flexible sigmoidoscopy) you may notice spotting of blood in your stool or on the toilet paper. If you underwent a bowel prep for your procedure, you may not have a normal bowel movement for a few days.  Please Note:  You might notice some irritation and congestion in your nose or some drainage.  This is from the oxygen used during your procedure.  There is no need for concern and it should clear up in a day or so.  SYMPTOMS TO REPORT IMMEDIATELY:  Following lower endoscopy (colonoscopy):  Excessive amounts of blood in the stool  Significant tenderness or worsening of abdominal pains  Swelling of the abdomen that is new, acute  Fever of 100F or higher  For urgent or emergent issues, a gastroenterologist can be reached at any hour by calling [phone]  [phone]. Do not use MyChart messaging for urgent concerns.   DIET:  We do recommend a small meal at first, but then you may proceed to your regular diet.  Drink plenty of fluids but you should avoid alcoholic beverages for 24 hours.  ACTIVITY:  You should plan to take it easy for the rest of today and you should NOT DRIVE or use heavy machinery until tomorrow (because of the sedation medicines used during the test).    FOLLOW UP: Our staff will call the number listed on your records the next business day following your procedure.  We will call around 7:15- 8:00 am to check on you and address any questions or concerns that you may have regarding the information given to you following your procedure. If we do not reach you, we will leave a message.     SIGNATURES/CONFIDENTIALITY: You and/or your care partner have signed paperwork which will be entered into your electronic medical record.  These signatures attest to the fact that that the information above on your After Visit Summary has been reviewed and is understood.  Full responsibility of the confidentiality of this discharge information lies with you and/or your care-partner.

## 2024-01-01 NOTE — Progress Notes (Signed)
 Pt's states no medical or surgical changes since previsit or office visit.

## 2024-01-01 NOTE — Op Note (Signed)
 [facility] Patient Name: Joyce Gray Procedure Date: 01/01/2024 1:47 PM MRN: [id number] Endoscopist: Inocente Hausen , MD, [id number] Age: 48 Referring MD:  Date of Birth: 07/26/1975 Gender: Female Account #: [id number] Procedure:                Colonoscopy Indications:              Colon cancer screening in patient at increased                            risk: Family history of 1st-degree relative with                            colon polyps, This is the patient's first                            colonoscopy Medicines:                Monitored Anesthesia Care Procedure:                Pre-Anesthesia Assessment:                           - Prior to the procedure, a History and Physical                            was performed, and patient medications and                            allergies were reviewed. The patient's tolerance of                            previous anesthesia was also reviewed. The risks                            and benefits of the procedure and the sedation                            options and risks were discussed with the patient.                            All questions were answered, and informed consent                            was obtained. Prior Anticoagulants: The patient has                            taken no anticoagulant or antiplatelet agents. ASA                            Grade Assessment: II - A patient with mild systemic                            disease. After reviewing the risks and benefits,  the patient was deemed in satisfactory condition to                            undergo the procedure.                           After obtaining informed consent, the colonoscope                            was passed under direct vision. Throughout the                            procedure, the patient's blood pressure, pulse, and                            oxygen saturations were monitored continuously. The                             CF [id number] #[id number] was introduced through the anus                            and advanced to the cecum, identified by                            appendiceal orifice and ileocecal valve. The                            colonoscopy was performed without difficulty. The                            patient tolerated the procedure well. The quality                            of the bowel preparation was good. The ileocecal                            valve, appendiceal orifice, and rectum were                            photographed. Scope In: 1:59:43 PM Scope Out: 2:14:54 PM Scope Withdrawal Time: 0 hours 10 minutes 26 seconds  Total Procedure Duration: 0 hours 15 minutes 11 seconds  Findings:                 The perianal and digital rectal examinations were                            normal. Pertinent negatives include normal                            sphincter tone and no palpable rectal lesions.                           The colon (entire examined portion) appeared normal.  The retroflexed view of the distal rectum and anal                            verge was normal and showed no anal or rectal                            abnormalities. Complications:            No immediate complications. Estimated blood loss:                            None. Estimated Blood Loss:     Estimated blood loss: none. Impression:               - The entire examined colon is normal.                           - The distal rectum and anal verge are normal on                            retroflexion view.                           - No specimens collected. Recommendation:           - Discharge patient to home (ambulatory).                           - Repeat colonoscopy in 10 years for screening                            purposes.                           - The findings and recommendations were discussed                            with the patient's family.                            - Return to referring physician.                           - Patient has a contact number available for                            emergencies. The signs and symptoms of potential                            delayed complications were discussed with the                            patient. Return to normal activities tomorrow.                            Written discharge instructions were provided to the  patient. Inocente Hausen, MD 01/01/2024 2:18:53 PM This report has been signed electronically.

## 2024-01-01 NOTE — Progress Notes (Signed)
 Report given to PACU, vss

## 2024-01-02 ENCOUNTER — Telehealth: Payer: Self-pay

## 2024-01-02 NOTE — Telephone Encounter (Signed)
  Follow up Call-     01/01/2024    1:18 PM  Call back number  Post procedure Call Back phone  # [phone]  Permission to leave phone message Yes     Patient questions:  Do you have a fever, pain , or abdominal swelling? No. Pain Score  0 *  Have you tolerated food without any problems? Yes.    Have you been able to return to your normal activities? Yes.    Do you have any questions about your discharge instructions: Diet   No. Medications  No. Follow up visit  No.  Do you have questions or concerns about your Care? No.  Actions: * If pain score is 4 or above: No action needed, pain <4.

## 2024-01-02 NOTE — Telephone Encounter (Signed)
  Follow up Call-     01/01/2024    1:18 PM  Call back number  Post procedure Call Back phone  # [phone]  Permission to leave phone message Yes     Patient questions:  Do you have a fever, pain , or abdominal swelling? No. Pain Score  0 *  Have you tolerated food without any problems? Yes.    Have you been able to return to your normal activities? Yes.    Do you have any questions about your discharge instructions: Diet   No. Medications  No. Follow up visit  No.  Do you have questions or concerns about your Care? No.  Actions: * If pain score is 4 or above: No action needed, pain <4.

## 2024-02-16 ENCOUNTER — Ambulatory Visit: Admitting: Physician Assistant

## 2024-03-08 ENCOUNTER — Ambulatory Visit: Payer: Self-pay

## 2024-03-08 ENCOUNTER — Encounter: Payer: Self-pay | Admitting: Family Medicine

## 2024-03-08 NOTE — Telephone Encounter (Signed)
 FYI Only or Action Required?: Action required by provider: medication refill request.  Patient was last seen in primary care on 11/29/2023 by Kennyth Worth HERO, MD.  Called Nurse Triage reporting Anxiety.  Symptoms began yesterday.  Interventions attempted: Prescription medications: lexapro .  Symptoms are: gradually worsening.  Triage Disposition: See PCP When Office is Open (Within 3 Days)  Patient/caregiver understands and will follow disposition?: No, wishes to speak with PCP   Pt is going to go to UC for sore throat.  For anxiety and depression, she is asking that her Lexapro  be increased. Increased stressors in her life currently.    Copied from CRM #[id number]. Topic: Clinical - Red Word Triage >> Mar 08, 2024 12:43 PM Alfonso ORN wrote: Red Word that prompted transfer to Nurse Triage: pt stated anxiety and depression getting worse . Other provider cannot fill medication until December also has sore throat and upset stomach as of Monday. Reason for Disposition  MODERATE anxiety (e.g., persistent or frequent anxiety symptoms; interferes with sleep, school, or work)  Answer Assessment - Initial Assessment Questions Pt states she is currently dealing with harrassment at work and she got an email yesterday from the [employer] rep saying they needed to have a meeting and that increased her anxiety.    1. CONCERN: Did anything happen that prompted you to call today?      Yesterday it got worse 2. ANXIETY SYMPTOMS: Can you describe how you (your loved one; patient) have been feeling? (e.g., tense, restless, panicky, anxious, keyed up, overwhelmed, sense of impending doom).      Overwhelmed, attacked, on the defense, worried, not sleeping well  3. ONSET: How long have you been feeling this way? (e.g., hours, days, weeks)     Yesterday   5. FUNCTIONAL IMPAIRMENT: How have these feelings affected your ability to do daily activities? Have you had more difficulty than usual doing your  normal daily activities? (e.g., getting better, same, worse; self-care, school, work, interactions)     yes 6. HISTORY: Have you felt this way before? Have you ever been diagnosed with an anxiety problem in the past? (e.g., generalized anxiety disorder, panic attacks, PTSD). If Yes, ask: How was this problem treated? (e.g., medicines, counseling, etc.)     Yes worse with this current stress 7. RISK OF HARM - SUICIDAL IDEATION: Do you ever have thoughts of hurting or killing yourself? If Yes, ask:  Do you have these feelings now? Do you have a plan on how you would do this?     no 8. TREATMENT:  What has been done so far to treat this anxiety? (e.g., medicines, relaxation strategies). What has helped?     Currently on lexapro  9. THERAPIST: Do you have a counselor or therapist? If Yes, ask: What is their name?     Yes but not able to see her until December 10. POTENTIAL TRIGGERS: Do you drink caffeinated beverages (e.g., coffee, colas, teas), and how much daily? Do you drink alcohol or use any drugs? Have you started any new medicines recently?       Recent harrassment case 11. PATIENT SUPPORT: Who is with you now? Who do you live with? Do you have family or friends who you can talk to?        Slightly but could be better  12. OTHER SYMPTOMS: Do you have any other symptoms? (e.g., feeling depressed, trouble concentrating, trouble sleeping, trouble breathing, palpitations or fast heartbeat, chest pain, sweating, nausea, or diarrhea)  Slight trouble eating.  Answer Assessment - Initial Assessment Questions Nasal drainage    1. ONSET: When did the throat start hurting? (Hours or days ago)      Monday 2. SEVERITY: How bad is the sore throat? (Scale 1-10; mild, moderate or severe)     5 3. STREP EXPOSURE: Has there been any exposure to strep within the past week? If Yes, ask: What type of contact occurred?      Not that aware 4.  VIRAL SYMPTOMS:  Are there any symptoms of a cold, such as a runny nose, cough, hoarse voice or red eyes?      Nasal drainage  5. FEVER: Do you have a fever? If Yes, ask: What is your temperature, how was it measured, and when did it start?     no 6. PUS ON THE TONSILS: Is there pus on the tonsils in the back of your throat?      7. OTHER SYMPTOMS: Do you have any other symptoms? (e.g., difficulty breathing, headache, rash)     nausea  Protocols used: Anxiety and Panic Attack-A-AH, Sore Throat-A-AH

## 2024-03-08 NOTE — Telephone Encounter (Signed)
**Note De-identified  [place] Obfuscation** Please advise 

## 2024-03-08 NOTE — Telephone Encounter (Signed)
 Ok to place referral.  Worth HERO. Kennyth, MD 03/08/2024 11:21 AM

## 2024-03-11 ENCOUNTER — Other Ambulatory Visit: Payer: Self-pay | Admitting: *Deleted

## 2024-03-11 DIAGNOSIS — F419 Anxiety disorder, unspecified: Secondary | ICD-10-CM

## 2024-03-11 DIAGNOSIS — F339 Major depressive disorder, recurrent, unspecified: Secondary | ICD-10-CM

## 2024-03-11 NOTE — Telephone Encounter (Signed)
 Spoke with patient, patient will like to increase dose of Lexapro   Patient stated Provider is out of the office till December  Advise to call their office for medication changes

## 2024-03-11 NOTE — Telephone Encounter (Signed)
 Referral placed.

## 2024-03-28 ENCOUNTER — Other Ambulatory Visit: Payer: Self-pay | Admitting: Interventional Radiology

## 2024-03-28 DIAGNOSIS — D259 Leiomyoma of uterus, unspecified: Secondary | ICD-10-CM

## 2024-04-15 ENCOUNTER — Ambulatory Visit: Admitting: Psychology

## 2024-04-15 ENCOUNTER — Encounter: Payer: Self-pay | Admitting: Psychology

## 2024-04-15 DIAGNOSIS — F419 Anxiety disorder, unspecified: Secondary | ICD-10-CM

## 2024-04-15 DIAGNOSIS — F330 Major depressive disorder, recurrent, mild: Secondary | ICD-10-CM | POA: Diagnosis not present

## 2024-04-15 NOTE — Progress Notes (Signed)
 [facility] Counselor Initial Adult Exam  Name: Joyce Gray Date: 04/15/2024 MRN: [id number] DOB: 02-25-1976 PCP: [place] HERO, MD  Time spent: 9:03am-10:05am  Pt is seen for an in person visit.  Guardian/Payee:  self    Paperwork requested: No   Reason for Visit /Presenting Problem: Pt reports she is referred by her PCP for anxiety and depression.  Pt reports she has been taking Lexapro  5mg  since 2012 for depression and feels that manages her depression well.  Pt reports she decided to seek out counseling related to work stress experiencing.  Pt shared that a coworker asked her out June 2023 and she declined.  Pt reports that his behaviors made her uncomfortable- feeling that he went out of his ways to run into her.  Pt addressed through principal and HR and felt somewhat dismissed and continued to escalate to a time that felt threatened by his action.  Pt reports current principal has been more supportive- they are to park in different lots, not assigned to work together and give her space.  Pt reports that she has questioned herself in actions she has decided to take to advocate for herself.  Pt reports will self doubt.  Pt also reports she feels that HR has been dismissive of her concerns- stating that didn't meet Title 9 threshold.  Pt reports she did have situation in another school system that she felt bullied in by students/parents and her administration.  Pt reports she was required to take medical leave and have EAP counseling by HR in that system or face disciplinary leave.  Pt felt that her rights were violated by this HR system and by her administration when returned when put on corrective action plans.  Pt is seeking counseling through support of current stressor and feeling confident in advocating for self.     Mental Status Exam: Appearance:   Well Groomed     Behavior:  Appropriate  Motor:  Normal  Speech/Language:   Clear and Coherent and Normal Rate   Affect:  Appropriate  Mood:  anxious and depressed  Thought process:  normal  Thought content:    WNL  Sensory/Perceptual disturbances:    WNL  Orientation:  oriented to person, place, time/date, and situation  Attention:  Good  Concentration:  Good  Memory:  WNL  Fund of knowledge:   Good  Insight:    Good  Judgment:   Good  Impulse Control:  Good   Reported Symptoms:  Pt reports excessive worry and difficulty controlling worry.  Pt reports feels on edge and difficulty relaxing.  Pt reports loss of interest, some down days and fatigue.  Pt reports self doubts and struggling to feel confidence in her decisions and actions.    Risk Assessment: Danger to Self:  No Self-injurious Behavior: No Danger to Others: No Duty to Warn:no Physical Aggression / Violence:No  Access to Firearms a concern: No  Gang Involvement:No  Patient / guardian was educated about steps to take if suicide or homicide risk level increases between visits: no While future psychiatric events cannot be accurately predicted, the patient does not currently require acute inpatient psychiatric care and does not currently meet [place]  involuntary commitment criteria.  Substance Abuse History: Current substance abuse: No     Past Psychiatric History:   Previous psychological history is significant for anxiety and depression Outpatient Providers:Pt reports saw an EAP counselor in 2011.   History of Psych Hospitalization: No  Psychological Testing: none reported  Abuse History:  Victim of: No., none reported   Report needed: No. Victim of Neglect:No. Perpetrator of none reported   Witness / Exposure to Domestic Violence: No   Protective Services Involvement: No  Witness to [employer] Violence:  No   Family History:  Family History  Problem Relation Age of Onset   Hyperlipidemia Mother    Alcohol abuse Mother    Melanoma Father    Bipolar disorder Brother    Breast cancer Maternal Aunt 63    Melanoma Paternal Aunt 45   Heart disease Maternal Grandfather    Colon cancer Neg Hx    Esophageal cancer Neg Hx    Rectal cancer Neg Hx    Stomach cancer Neg Hx    Colon polyps Neg Hx   Pt grew up in [place], [place].  Pt parents separated when she was 8y/o.  Pt lived w/ her mom and brother, who was 4 yrs younger, until she was 48y/o.  Pt would visit w/ dad.  Dad remarried when she was 13y/o and pt lived between dad's and mom's until high school and then lived w/ dad through out high school.  Pt reports she is close to dad.  Pt mom lives in [place] area as does her brother.  Pt reports her brother has struggled w/ mental health and addiction.  Pt has a younger step sister (6 yrs younger) who is married w/ kids and lives out of state.  Pt reports they aren't close but no conflict.    Living situation: the patient lives alone in [place], [place].  Sexual Orientation: Straight  Relationship Status: single  No children   Support Systems: friends Dad   Financial Stress:  No   Income/Employment/Disability: Employment.  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  Pt likes her job, feels that makes impact w/ student.  She is the [employer].  Pt feels good support from staff. 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   Military Service: No   Educational History: Education: engineer, maintenance (it) from [employer]   Religion/Sprituality/World View: Wilson.  Pt has a online group of friends that does bible study with each week.  Any cultural differences that may affect / interfere with treatment:  not applicable   Recreation/Hobbies: not reported today  Stressors: Occupational concerns    Strengths: Friends and Journalist, [employer]  Barriers:  work schedule as Retail Banker History: Pending legal issue  / charges: The patient has no significant history of legal issues. History of legal issue / charges: none  Medical History/Surgical History: reviewed Past Medical History:  Diagnosis Date   Abnormal Pap smear of cervix 1999, 2016   Allergy    Anemia    Anxiety    Arthritis    Complication of anesthesia    felt like heart rate was a little low after d and c 01-16-2023 ( done in office)   Depression    Hyperlipidemia    Incomplete right bundle branch block    dx in 2009  stress test 2009 normal no cardiologist, repeat ekg 2013 was nsr   Lyme disease 2020   Obesity    pt continues to work to decrease weight   [facility] spotted fever 02/2019   Wears glasses or contacts  Past Surgical History:  Procedure Laterality Date   CRYOTHERAPY  1999   for abnormal pap smear   d & c hysteroscopy myomectomy  01/16/2023   done in office   DILATATION & CURETTAGE/HYSTEROSCOPY WITH MYOSURE N/A 02/10/2023   Procedure: DILATATION & CURETTAGE/HYSTEROSCOPY WITH MYOSURE;  Surgeon: Gloriann Chick, MD;  Location: [facility];  Service: Gynecology;  Laterality: N/A;   IR ANGIOGRAM PELVIS SELECTIVE OR SUPRASELECTIVE  09/01/2023   IR ANGIOGRAM SELECTIVE EACH ADDITIONAL VESSEL  09/01/2023   IR ANGIOGRAM SELECTIVE EACH ADDITIONAL VESSEL  09/01/2023   IR EMBO TUMOR ORGAN ISCHEMIA INFARCT INC GUIDE ROADMAPPING  09/01/2023   IR RADIOLOGIST EVAL & MGMT  07/03/2023   IR RADIOLOGIST EVAL & MGMT  10/12/2023   IR US  GUIDE VASC ACCESS RIGHT  09/01/2023   TYMPANOSTOMY TUBE PLACEMENT     1980 and 1982   WISDOM TOOTH EXTRACTION Bilateral 1996    Medications: Current Outpatient Medications  Medication Sig Dispense Refill   Cholecalciferol (VITAMIN D3) 125 MCG (5000 UT) CAPS Take 5,000-10,000 Units by mouth See admin instructions. Take 5,000 units by mouth on Sun/Tues/Wed/Fri/Sat and 10,000 units on Mon/Thurs     escitalopram  (LEXAPRO ) 5 MG tablet Take 5 mg by mouth daily.     Multiple Vitamin  (MULTI-VITAMIN DAILY PO) Take 1 tablet by mouth daily with breakfast.     Omega-3 Fatty Acids (FISH OIL) 1000 MG CAPS Take 2,000 mg by mouth in the morning.     No current facility-administered medications for this visit.    No Known Allergies  Diagnoses:  Anxiety disorder, unspecified type  Mild episode of recurrent major depressive disorder  Plan of Care: Pt is a 48y/o female seeking counseling as referred by her PCP.  Pt reports current stressors related to work and reported harassment by radio broadcast assistant.  Pt has hx of some anxiety and depression and tx for w/ counseling and medication management.  Pt seeking support through current stressors, how to manage to reduce anxiety and depression and to be able to advocate for self confidently.  Pt to f/u w/ biweekly counseling.  Pt to continue w/her PCP as scheduled.     BARBARANN APPL, LCMHC

## 2024-04-15 NOTE — Progress Notes (Signed)
 Behavioral Health Treatment Plan   Name:Joyce Gray   MRN: [id number]   Treatment Plan Development Date: 04/15/24   Strengths: Friends and Self Advocate  Supports: Friends   Theatre Manager of Needs: Pt I want to be able to advocate for myself and be ok with it and if people don't agree.  I want to be confident in myself/trust myself .   Treatment Level:outpatient counseling  Client Treatment Preferences:in person biweekly counseling.  Continue medication management w/ PCP.   Diagnosis Depressive Disorders  MDD, recurrent mild  Symptoms:  Loss of interest or pleasure in almost all activities-indicated by subjective report or observation by others. and A sense of worthlessness or excessive, inappropriate, or delusional guilt (not merely self-reproach or guilt about being sick).  Goals:  Increase positive self worth and Recognize, accept, and cope with depressed feelings.  Objectives: Target Date For All Objectives: 04/15/25  Learn and implement problem-solving., Learn and implement conflict resolution skills., Use mindfulness and acceptance strategies to reduce experiential and cognitive avoidance and increase value-based behavior., and Increasingly verbalize hopeful and positive statements regarding self, others, and the future.  Progress Documentation:  Progressing  Interventions:  Cognitive Behavioral Therapy, Assertiveness/Communication, Mindfulness Meditation, Solution-Oriented/Positive Psychology, Insight-Oriented, and supportive, and Diagnosis: Anxiety  Anxiety D/O unspecified  Symptoms:  Excessive and/or unrealistic worry that is difficult to control occurring more days than not for at least 6 months about a number of events or activities and Difficulty managing worry  Goals:  Improve daily functioning by reducing overall anxiety symptoms including intensity, frequency, and duration of symptoms.  Objectives: Target Date For All Objectives:  04/15/25  Develop coping tools to manage anxiety including mindfulness, acceptance, relaxation, reframing, and challenging negative thoughts and feelings., Identify, challenge, and manage negative self-talk, negative thinking about self and others, and maintain mindfulness regarding self-talk and cognitive distortions., and Increase assertive communication skills  Progress Documentation:  Progressing  Interventions:  CBT - reframing, challenging, cognitive restructuring, ACT, Relaxation and mindfulness, Assertiveness, and Strength-based   Expected duration of treatment: 04/15/25  Party responsible for implementation of interventions: pt and counselor.   This plan has been reviewed and created by the following participants: pt and counselor   A new plan will be created at least every 12 months.  The patient fully participated in the development of treatment plan with the clinician and verbally consents to such treatment.   Patient Treatment Plan Signature Obtained: No, pending signature via MyChart.   Vergil Burby, [facility]                [place], LCMHC

## 2024-04-26 ENCOUNTER — Encounter: Payer: Self-pay | Admitting: Interventional Radiology

## 2024-05-02 ENCOUNTER — Ambulatory Visit: Admitting: Psychology

## 2024-05-02 DIAGNOSIS — F330 Major depressive disorder, recurrent, mild: Secondary | ICD-10-CM

## 2024-05-02 DIAGNOSIS — F419 Anxiety disorder, unspecified: Secondary | ICD-10-CM

## 2024-05-02 NOTE — Progress Notes (Unsigned)
 [facility] Counselor/Therapist Progress Note  Patient ID: Joyce Gray, MRN: [id number],    Date: 05/02/2024  Time Spent: 1:32pm-2:33pm  Pt is seen for in person visit.   Treatment Type: {CHL AMB THERAPY TYPES:[phone]}  Reported Symptoms: ***  Mental Status Exam: Appearance:  {PSY:22683}     Behavior: {PSY:21022743}  Motor: {PSY:22302}  Speech/Language:  {PSY:22685}  Affect: {PSY:22687}  Mood: {PSY:31886}  Thought process: {PSY:31888}  Thought content:   {[phone]}  Sensory/Perceptual disturbances:   {[phone]}  Orientation: {PSY:30297}  Attention: {PSY:22877}  Concentration: {PSY:934-617-5057}  Memory: {PSY:7788724332}  Fund of knowledge:  {PSY:934-617-5057}  Insight:   {PSY:934-617-5057}  Judgment:  {PSY:934-617-5057}  Impulse Control: {PSY:934-617-5057}   Risk Assessment: Danger to Self:  {PSY:22692} Self-injurious Behavior: {PSY:22692} Danger to Others: {PSY:22692} Duty to Warn:{PSY:311194} Physical Aggression / Violence:{PSY:21197} Access to Firearms a concern: {PSY:21197} Gang Involvement:{PSY:21197}  Subjective: ***   Interventions: {[phone]}  Diagnosis:Anxiety disorder, unspecified type  Mild episode of recurrent major depressive disorder  Plan: PIERRETTE BARBARANN APPL, [facility]

## 2024-05-02 NOTE — Progress Notes (Unsigned)
 Behavioral Health Treatment Plan   Name:Joyce Gray   MRN: [id number]   Treatment Plan Development Date: 04/15/24   Strengths: Friends and Self Advocate  Supports: Friends   Theatre Manager of Needs: Pt I want to be able to advocate for myself and be ok with it and if people don't agree.  I want to be confident in myself/trust myself .   Treatment Level:outpatient counseling  Client Treatment Preferences:in person biweekly counseling.  Continue medication management w/ PCP.   Diagnosis Depressive Disorders  MDD, recurrent mild  Symptoms:  Loss of interest or pleasure in almost all activities-indicated by subjective report or observation by others. and A sense of worthlessness or excessive, inappropriate, or delusional guilt (not merely self-reproach or guilt about being sick).  Goals:  Increase positive self worth and Recognize, accept, and cope with depressed feelings.  Objectives: Target Date For All Objectives: 04/15/25  Learn and implement problem-solving., Learn and implement conflict resolution skills., Use mindfulness and acceptance strategies to reduce experiential and cognitive avoidance and increase value-based behavior., and Increasingly verbalize hopeful and positive statements regarding self, others, and the future.  Progress Documentation:  Progressing  Interventions:  Cognitive Behavioral Therapy, Assertiveness/Communication, Mindfulness Meditation, Solution-Oriented/Positive Psychology, Insight-Oriented, and supportive, and Diagnosis: Anxiety  Anxiety D/O unspecified  Symptoms:  Excessive and/or unrealistic worry that is difficult to control occurring more days than not for at least 6 months about a number of events or activities and Difficulty managing worry  Goals:  Improve daily functioning by reducing overall anxiety symptoms including intensity, frequency, and duration of symptoms.  Objectives: Target Date For All Objectives:  04/15/25  Develop coping tools to manage anxiety including mindfulness, acceptance, relaxation, reframing, and challenging negative thoughts and feelings., Identify, challenge, and manage negative self-talk, negative thinking about self and others, and maintain mindfulness regarding self-talk and cognitive distortions., and Increase assertive communication skills  Progress Documentation:  Progressing  Interventions:  CBT - reframing, challenging, cognitive restructuring, ACT, Relaxation and mindfulness, Assertiveness, and Strength-based   Expected duration of treatment: 04/15/25  Party responsible for implementation of interventions: pt and counselor.   This plan has been reviewed and created by the following participants: pt and counselor   A new plan will be created at least every 12 months.  The patient fully participated in the development of treatment plan with the clinician and verbally consents to such treatment.   Patient Treatment Plan Signature Obtained: Yes, please see patient chart for sign off.   Kenyada Dosch, LCMHC                Emmalise Huard, [facility]               [place], LCMHC

## 2024-05-13 ENCOUNTER — Other Ambulatory Visit

## 2024-05-13 DIAGNOSIS — D259 Leiomyoma of uterus, unspecified: Secondary | ICD-10-CM

## 2024-05-13 MED ORDER — GADOPICLENOL 0.5 MMOL/ML IV SOLN
10.0000 mL | Freq: Once | INTRAVENOUS | Status: AC | PRN
  Administered 2024-05-13: 10 mL via INTRAVENOUS

## 2024-05-20 ENCOUNTER — Ambulatory Visit: Payer: Self-pay | Admitting: Psychiatry

## 2024-05-21 ENCOUNTER — Ambulatory Visit: Admitting: Psychology

## 2024-05-21 DIAGNOSIS — F419 Anxiety disorder, unspecified: Secondary | ICD-10-CM | POA: Diagnosis not present

## 2024-05-21 DIAGNOSIS — F330 Major depressive disorder, recurrent, mild: Secondary | ICD-10-CM | POA: Diagnosis not present

## 2024-05-21 NOTE — Progress Notes (Signed)
 Behavioral Health Treatment Plan   Name:Joyce Gray   MRN: [id number]   Treatment Plan Development Date: 04/15/24   Strengths: Friends and Self Advocate  Supports: Friends   Theatre Manager of Needs: Pt I want to be able to advocate for myself and be ok with it and if people don't agree.  I want to be confident in myself/trust myself .   Treatment Level:outpatient counseling  Client Treatment Preferences:in person biweekly counseling.  Continue medication management w/ PCP.   Diagnosis Depressive Disorders  MDD, recurrent mild  Symptoms:  Loss of interest or pleasure in almost all activities-indicated by subjective report or observation by others. and A sense of worthlessness or excessive, inappropriate, or delusional guilt (not merely self-reproach or guilt about being sick).  Goals:  Increase positive self worth and Recognize, accept, and cope with depressed feelings.  Objectives: Target Date For All Objectives: 04/15/25  Learn and implement problem-solving., Learn and implement conflict resolution skills., Use mindfulness and acceptance strategies to reduce experiential and cognitive avoidance and increase value-based behavior., and Increasingly verbalize hopeful and positive statements regarding self, others, and the future.  Progress Documentation:  Progressing  Interventions:  Cognitive Behavioral Therapy, Assertiveness/Communication, Mindfulness Meditation, Solution-Oriented/Positive Psychology, Insight-Oriented, and supportive, and Diagnosis: Anxiety  Anxiety D/O unspecified  Symptoms:  Excessive and/or unrealistic worry that is difficult to control occurring more days than not for at least 6 months about a number of events or activities and Difficulty managing worry  Goals:  Improve daily functioning by reducing overall anxiety symptoms including intensity, frequency, and duration of symptoms.  Objectives: Target Date For All Objectives:  04/15/25  Develop coping tools to manage anxiety including mindfulness, acceptance, relaxation, reframing, and challenging negative thoughts and feelings., Identify, challenge, and manage negative self-talk, negative thinking about self and others, and maintain mindfulness regarding self-talk and cognitive distortions., and Increase assertive communication skills  Progress Documentation:  Progressing  Interventions:  CBT - reframing, challenging, cognitive restructuring, ACT, Relaxation and mindfulness, Assertiveness, and Strength-based   Expected duration of treatment: 04/15/25  Party responsible for implementation of interventions: pt and counselor.   This plan has been reviewed and created by the following participants: pt and counselor   A new plan will be created at least every 12 months.  The patient fully participated in the development of treatment plan with the clinician and verbally consents to such treatment.   Patient Treatment Plan Signature Obtained: Yes, please see patient chart for sign off.   Laiklyn Pilkenton, [facility]                [place], LCMHC

## 2024-05-21 NOTE — Progress Notes (Signed)
"    [place] [facility] Counselor/Therapist Progress Note  Patient ID: Joyce Gray, MRN: [id number],    Date: 05/21/2024  Time Spent: 9:00am-9:57am  Pt is seen for a virtual video visit via caregility.  Pt joins from her home and counselor from her home office.  Pt consents to virtual visit and is aware of limitations of such visit.   Treatment Type: Individual Therapy  Reported Symptoms: anxious, worried about recent confrontation w/ coworker  Mental Status Exam: Appearance:  Well Groomed     Behavior: Appropriate  Motor: Normal  Speech/Language:  Clear and Coherent  Affect: Appropriate  Mood: anxious  Thought process: normal  Thought content:   WNL  Sensory/Perceptual disturbances:   WNL  Orientation: oriented to person, place, time/date, and situation  Attention: Good  Concentration: Good  Memory: WNL  Fund of knowledge:  Good  Insight:   Good  Judgment:  Good  Impulse Control: Good   Risk Assessment: Danger to Self:  No Self-injurious Behavior: No Danger to Others: No Duty to Warn:no Physical Aggression / Violence:No  Access to Firearms a concern: No  Gang Involvement:No   Subjective: Counselor assessed pt current functioning per pt report.  Processed w/ pt recent stressors and anxiety, worried thoughts.  Discussed ways of reframing and not internalizing negative interaction.   Discussed conflict resolution and focus on assertive communication and moving toward agreed goals/future interactions.    Pt affect wnl.  Pt reports positives w/ break and time w/ family.  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  Pt felt attacked and escalated.  Pt reports sent apology and request to meet for collaboration.  Pt felt confused by coworkers intentions and want to have respectful interaction.  Pt was able to focus on want for collaboration for departments and recognized hurt feelings from past interaction  impeding for both.  Pt recognized that can focus on new goals of collaboration for consistency in departments and how her effective communication w/ active listening can assist this. Pt felt less anxious about future interactions.     Interventions: Cognitive Behavioral Therapy, Assertiveness/Communication, and supportive  Diagnosis:Anxiety disorder, unspecified type  Mild episode of recurrent major depressive disorder  Plan: pt to f/u in 2 weeks for counseling.  Pt to f/u as scheduled w/ PCP.  BARBARANN APPL, [facility]        "

## 2024-05-30 ENCOUNTER — Other Ambulatory Visit: Payer: Self-pay | Admitting: Interventional Radiology

## 2024-05-30 DIAGNOSIS — D259 Leiomyoma of uterus, unspecified: Secondary | ICD-10-CM

## 2024-06-10 ENCOUNTER — Ambulatory Visit: Admitting: Psychology

## 2024-06-10 DIAGNOSIS — F419 Anxiety disorder, unspecified: Secondary | ICD-10-CM | POA: Diagnosis not present

## 2024-06-10 DIAGNOSIS — F330 Major depressive disorder, recurrent, mild: Secondary | ICD-10-CM

## 2024-06-10 NOTE — Progress Notes (Signed)
"    [facility] Counselor/Therapist Progress Note  Patient ID: Joyce Gray, MRN: [id number],    Date: 06/10/2024  Time Spent: 1:31pm-2:35pm  Pt is seen for an an person visit.  Treatment Type: Individual Therapy  Reported Symptoms: anxious, rumination about interactions w/ coworker.  Mental Status Exam: Appearance:  Well Groomed     Behavior: Appropriate  Motor: Normal  Speech/Language:  Clear and Coherent  Affect: Appropriate  Mood: anxious  Thought process: normal  Thought content:   WNL  Sensory/Perceptual disturbances:   WNL  Orientation: oriented to person, place, time/date, and situation  Attention: Good  Concentration: Good  Memory: WNL  Fund of knowledge:  Good  Insight:   Good  Judgment:  Good  Impulse Control: Good   Risk Assessment: Danger to Self:  No Self-injurious Behavior: No Danger to Others: No Duty to Warn:no Physical Aggression / Violence:No  Access to Firearms a concern: No  Gang Involvement:No   Subjective: Counselor assessed pt current functioning per pt report.  Processed w/ pt recent stressors and anxiety.  Explored interactions w/ coworker and meeting w/ admin.  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  Discussed intentions and how may have been misread and getting clear guidance from admin as expectations as department lead and if needs to communicate how to coworker how to do.  Explored ways of letting go and not ruminating on and focus on things consistent w/ what she values.  Pt affect wnl.  Pt reports she is feeling anxious and has been worrying about interactions w/ coworker.  Pt reports following return to school interactions seemed ok and then at meeting that coworker called she was accused of harassing.  Pt felt caught off guard about and discussed recent interaction when out of concern may need assistance/safety checked in w/ coworker.  Pt discussed guidance from admin to not engage each  other.  Pt reflected on how has to let go that can't collaborate.  Pt focused on gaining for self that can be ok w/ uncertainty and ambiguity and growth for self.    Interventions: Cognitive Behavioral Therapy, Assertiveness/Communication, and supportive  Diagnosis:Anxiety disorder, unspecified type  Mild episode of recurrent major depressive disorder  Plan: pt to f/u in 2 weeks for counseling.  Pt to f/u as scheduled w/ PCP.     BARBARANN APPL, [facility] "

## 2024-06-10 NOTE — Progress Notes (Signed)
 Behavioral Health Treatment Plan   Name:Joyce Gray   MRN: [id number]   Treatment Plan Development Date: 04/15/24   Strengths: Friends and Self Advocate  Supports: Friends   Theatre Manager of Needs: Pt I want to be able to advocate for myself and be ok with it and if people don't agree.  I want to be confident in myself/trust myself .   Treatment Level:outpatient counseling  Client Treatment Preferences:in person biweekly counseling.  Continue medication management w/ PCP.   Diagnosis Depressive Disorders  MDD, recurrent mild  Symptoms:  Loss of interest or pleasure in almost all activities-indicated by subjective report or observation by others. and A sense of worthlessness or excessive, inappropriate, or delusional guilt (not merely self-reproach or guilt about being sick).  Goals:  Increase positive self worth and Recognize, accept, and cope with depressed feelings.  Objectives: Target Date For All Objectives: 04/15/25  Learn and implement problem-solving., Learn and implement conflict resolution skills., Use mindfulness and acceptance strategies to reduce experiential and cognitive avoidance and increase value-based behavior., and Increasingly verbalize hopeful and positive statements regarding self, others, and the future.  Progress Documentation:  Progressing  Interventions:  Cognitive Behavioral Therapy, Assertiveness/Communication, Mindfulness Meditation, Solution-Oriented/Positive Psychology, Insight-Oriented, and supportive, and Diagnosis: Anxiety  Anxiety D/O unspecified  Symptoms:  Excessive and/or unrealistic worry that is difficult to control occurring more days than not for at least 6 months about a number of events or activities and Difficulty managing worry  Goals:  Improve daily functioning by reducing overall anxiety symptoms including intensity, frequency, and duration of symptoms.  Objectives: Target Date For All Objectives:  04/15/25  Develop coping tools to manage anxiety including mindfulness, acceptance, relaxation, reframing, and challenging negative thoughts and feelings., Identify, challenge, and manage negative self-talk, negative thinking about self and others, and maintain mindfulness regarding self-talk and cognitive distortions., and Increase assertive communication skills  Progress Documentation:  Progressing  Interventions:  CBT - reframing, challenging, cognitive restructuring, ACT, Relaxation and mindfulness, Assertiveness, and Strength-based   Expected duration of treatment: 04/15/25  Party responsible for implementation of interventions: pt and counselor.   This plan has been reviewed and created by the following participants: pt and counselor   A new plan will be created at least every 12 months.  The patient fully participated in the development of treatment plan with the clinician and verbally consents to such treatment.   Patient Treatment Plan Signature Obtained: Yes, please see patient chart for sign off.   Laiklyn Pilkenton, [facility]                [place], LCMHC

## 2024-06-19 ENCOUNTER — Ambulatory Visit: Payer: Self-pay | Admitting: Psychiatry

## 2024-06-27 ENCOUNTER — Ambulatory Visit (INDEPENDENT_AMBULATORY_CARE_PROVIDER_SITE_OTHER): Admitting: Psychology

## 2024-06-27 DIAGNOSIS — F330 Major depressive disorder, recurrent, mild: Secondary | ICD-10-CM

## 2024-06-27 DIAGNOSIS — F419 Anxiety disorder, unspecified: Secondary | ICD-10-CM

## 2024-06-27 NOTE — Progress Notes (Signed)
 Behavioral Health Treatment Plan   Name:Joyce Gray   MRN: [id number]   Treatment Plan Development Date: 04/15/24   Strengths: Friends and Self Advocate  Supports: Friends   Theatre Manager of Needs: Pt I want to be able to advocate for myself and be ok with it and if people don't agree.  I want to be confident in myself/trust myself .   Treatment Level:outpatient counseling  Client Treatment Preferences:in person biweekly counseling.  Continue medication management w/ PCP.   Diagnosis Depressive Disorders  MDD, recurrent mild  Symptoms:  Loss of interest or pleasure in almost all activities-indicated by subjective report or observation by others. and A sense of worthlessness or excessive, inappropriate, or delusional guilt (not merely self-reproach or guilt about being sick).  Goals:  Increase positive self worth and Recognize, accept, and cope with depressed feelings.  Objectives: Target Date For All Objectives: 04/15/25  Learn and implement problem-solving., Learn and implement conflict resolution skills., Use mindfulness and acceptance strategies to reduce experiential and cognitive avoidance and increase value-based behavior., and Increasingly verbalize hopeful and positive statements regarding self, others, and the future.  Progress Documentation:  Progressing  Interventions:  Cognitive Behavioral Therapy, Assertiveness/Communication, Mindfulness Meditation, Solution-Oriented/Positive Psychology, Insight-Oriented, and supportive, and Diagnosis: Anxiety  Anxiety D/O unspecified  Symptoms:  Excessive and/or unrealistic worry that is difficult to control occurring more days than not for at least 6 months about a number of events or activities and Difficulty managing worry  Goals:  Improve daily functioning by reducing overall anxiety symptoms including intensity, frequency, and duration of symptoms.  Objectives: Target Date For All Objectives:  04/15/25  Develop coping tools to manage anxiety including mindfulness, acceptance, relaxation, reframing, and challenging negative thoughts and feelings., Identify, challenge, and manage negative self-talk, negative thinking about self and others, and maintain mindfulness regarding self-talk and cognitive distortions., and Increase assertive communication skills  Progress Documentation:  Progressing  Interventions:  CBT - reframing, challenging, cognitive restructuring, ACT, Relaxation and mindfulness, Assertiveness, and Strength-based   Expected duration of treatment: 04/15/25  Party responsible for implementation of interventions: pt and counselor.   This plan has been reviewed and created by the following participants: pt and counselor   A new plan will be created at least every 12 months.  The patient fully participated in the development of treatment plan with the clinician and verbally consents to such treatment.   Patient Treatment Plan Signature Obtained: Yes, please see patient chart for sign off.   Kenyada Dosch, LCMHC                Emmalise Huard, [facility]               [place], LCMHC
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
## 2024-07-03 ENCOUNTER — Ambulatory Visit: Payer: Self-pay | Admitting: Psychiatry

## 2024-07-05 ENCOUNTER — Other Ambulatory Visit

## 2024-07-17 ENCOUNTER — Ambulatory Visit: Admitting: Psychiatry

## 2024-07-18 ENCOUNTER — Ambulatory Visit: Admitting: Psychology

## 2024-08-01 ENCOUNTER — Ambulatory Visit: Admitting: Psychology

## 2024-11-29 ENCOUNTER — Encounter: Admitting: Family Medicine
